# Patient Record
Sex: Female | Born: 1965 | Race: Black or African American | Hispanic: No | Smoking: Current every day smoker
Health system: Southern US, Community
[De-identification: ages and names within clinical notes are randomized; demographics above are authoritative.]

## PROBLEM LIST (undated history)

## (undated) DIAGNOSIS — F419 Anxiety disorder, unspecified: Secondary | ICD-10-CM

## (undated) DIAGNOSIS — I10 Essential (primary) hypertension: Secondary | ICD-10-CM

## (undated) DIAGNOSIS — J449 Chronic obstructive pulmonary disease, unspecified: Secondary | ICD-10-CM

## (undated) DIAGNOSIS — R51 Headache: Secondary | ICD-10-CM

## (undated) DIAGNOSIS — F14988 Cocaine use, unspecified with other cocaine-induced disorder: Secondary | ICD-10-CM

## (undated) DIAGNOSIS — F250 Schizoaffective disorder, bipolar type: Secondary | ICD-10-CM

## (undated) DIAGNOSIS — F4310 Post-traumatic stress disorder, unspecified: Secondary | ICD-10-CM

## (undated) DIAGNOSIS — G8929 Other chronic pain: Secondary | ICD-10-CM

## (undated) DIAGNOSIS — R569 Unspecified convulsions: Secondary | ICD-10-CM

## (undated) DIAGNOSIS — M549 Dorsalgia, unspecified: Secondary | ICD-10-CM

## (undated) DIAGNOSIS — G629 Polyneuropathy, unspecified: Secondary | ICD-10-CM

## (undated) DIAGNOSIS — J45909 Unspecified asthma, uncomplicated: Secondary | ICD-10-CM

## (undated) HISTORY — PX: ABDOMINAL HYSTERECTOMY: SHX81

---

## 1999-12-22 ENCOUNTER — Emergency Department (HOSPITAL_COMMUNITY): Admission: EM | Admit: 1999-12-22 | Discharge: 1999-12-22 | Payer: Self-pay | Admitting: Emergency Medicine

## 2000-01-08 ENCOUNTER — Emergency Department (HOSPITAL_COMMUNITY): Admission: EM | Admit: 2000-01-08 | Discharge: 2000-01-08 | Payer: Self-pay | Admitting: Emergency Medicine

## 2000-03-06 ENCOUNTER — Emergency Department (HOSPITAL_COMMUNITY): Admission: EM | Admit: 2000-03-06 | Discharge: 2000-03-06 | Payer: Self-pay | Admitting: Emergency Medicine

## 2000-04-23 ENCOUNTER — Emergency Department (HOSPITAL_COMMUNITY): Admission: EM | Admit: 2000-04-23 | Discharge: 2000-04-23 | Payer: Self-pay

## 2000-05-05 ENCOUNTER — Emergency Department (HOSPITAL_COMMUNITY): Admission: EM | Admit: 2000-05-05 | Discharge: 2000-05-05 | Payer: Self-pay | Admitting: Emergency Medicine

## 2000-05-07 ENCOUNTER — Emergency Department (HOSPITAL_COMMUNITY): Admission: EM | Admit: 2000-05-07 | Discharge: 2000-05-29 | Payer: Self-pay | Admitting: Emergency Medicine

## 2000-08-23 ENCOUNTER — Emergency Department (HOSPITAL_COMMUNITY): Admission: EM | Admit: 2000-08-23 | Discharge: 2000-08-23 | Payer: Self-pay | Admitting: *Deleted

## 2000-08-29 ENCOUNTER — Emergency Department (HOSPITAL_COMMUNITY): Admission: EM | Admit: 2000-08-29 | Discharge: 2000-08-30 | Payer: Self-pay | Admitting: Emergency Medicine

## 2000-10-03 ENCOUNTER — Emergency Department (HOSPITAL_COMMUNITY): Admission: EM | Admit: 2000-10-03 | Discharge: 2000-10-03 | Payer: Self-pay | Admitting: Emergency Medicine

## 2000-10-03 ENCOUNTER — Encounter: Payer: Self-pay | Admitting: Emergency Medicine

## 2001-10-31 ENCOUNTER — Emergency Department (HOSPITAL_COMMUNITY): Admission: EM | Admit: 2001-10-31 | Discharge: 2001-10-31 | Payer: Self-pay

## 2001-12-02 ENCOUNTER — Emergency Department (HOSPITAL_COMMUNITY): Admission: EM | Admit: 2001-12-02 | Discharge: 2001-12-02 | Payer: Self-pay | Admitting: Emergency Medicine

## 2005-03-01 ENCOUNTER — Emergency Department (HOSPITAL_COMMUNITY): Admission: EM | Admit: 2005-03-01 | Discharge: 2005-03-01 | Payer: Self-pay | Admitting: Emergency Medicine

## 2005-07-01 ENCOUNTER — Emergency Department (HOSPITAL_COMMUNITY): Admission: EM | Admit: 2005-07-01 | Discharge: 2005-07-01 | Payer: Self-pay | Admitting: *Deleted

## 2005-12-07 ENCOUNTER — Emergency Department (HOSPITAL_COMMUNITY): Admission: EM | Admit: 2005-12-07 | Discharge: 2005-12-07 | Payer: Self-pay | Admitting: Emergency Medicine

## 2005-12-10 ENCOUNTER — Emergency Department (HOSPITAL_COMMUNITY): Admission: EM | Admit: 2005-12-10 | Discharge: 2005-12-10 | Payer: Self-pay | Admitting: Emergency Medicine

## 2006-02-07 ENCOUNTER — Emergency Department (HOSPITAL_COMMUNITY): Admission: EM | Admit: 2006-02-07 | Discharge: 2006-02-07 | Payer: Self-pay | Admitting: Emergency Medicine

## 2006-02-08 ENCOUNTER — Emergency Department (HOSPITAL_COMMUNITY): Admission: EM | Admit: 2006-02-08 | Discharge: 2006-02-08 | Payer: Self-pay | Admitting: Emergency Medicine

## 2007-01-05 ENCOUNTER — Emergency Department (HOSPITAL_COMMUNITY): Admission: EM | Admit: 2007-01-05 | Discharge: 2007-01-05 | Payer: Self-pay | Admitting: *Deleted

## 2007-01-16 ENCOUNTER — Emergency Department (HOSPITAL_COMMUNITY): Admission: EM | Admit: 2007-01-16 | Discharge: 2007-01-16 | Payer: Self-pay | Admitting: Emergency Medicine

## 2007-02-11 ENCOUNTER — Emergency Department (HOSPITAL_COMMUNITY): Admission: EM | Admit: 2007-02-11 | Discharge: 2007-02-11 | Payer: Self-pay | Admitting: Emergency Medicine

## 2007-04-12 ENCOUNTER — Emergency Department (HOSPITAL_COMMUNITY): Admission: EM | Admit: 2007-04-12 | Discharge: 2007-04-12 | Payer: Self-pay | Admitting: Emergency Medicine

## 2007-07-14 ENCOUNTER — Emergency Department (HOSPITAL_COMMUNITY): Admission: EM | Admit: 2007-07-14 | Discharge: 2007-07-14 | Payer: Self-pay | Admitting: Emergency Medicine

## 2007-08-03 ENCOUNTER — Emergency Department (HOSPITAL_COMMUNITY): Admission: EM | Admit: 2007-08-03 | Discharge: 2007-08-03 | Payer: Self-pay | Admitting: Emergency Medicine

## 2008-02-09 ENCOUNTER — Emergency Department (HOSPITAL_COMMUNITY): Admission: EM | Admit: 2008-02-09 | Discharge: 2008-02-09 | Payer: Self-pay | Admitting: Emergency Medicine

## 2008-05-24 ENCOUNTER — Emergency Department (HOSPITAL_COMMUNITY): Admission: EM | Admit: 2008-05-24 | Discharge: 2008-05-24 | Payer: Self-pay | Admitting: Emergency Medicine

## 2008-08-14 ENCOUNTER — Emergency Department (HOSPITAL_COMMUNITY): Admission: EM | Admit: 2008-08-14 | Discharge: 2008-08-14 | Payer: Self-pay | Admitting: Emergency Medicine

## 2008-08-20 ENCOUNTER — Emergency Department (HOSPITAL_COMMUNITY): Admission: EM | Admit: 2008-08-20 | Discharge: 2008-08-21 | Payer: Self-pay | Admitting: Emergency Medicine

## 2008-08-21 ENCOUNTER — Inpatient Hospital Stay (HOSPITAL_COMMUNITY): Admission: AD | Admit: 2008-08-21 | Discharge: 2008-08-22 | Payer: Self-pay | Admitting: Psychiatry

## 2008-08-21 ENCOUNTER — Ambulatory Visit: Payer: Self-pay | Admitting: Psychiatry

## 2008-08-27 ENCOUNTER — Emergency Department (HOSPITAL_COMMUNITY): Admission: EM | Admit: 2008-08-27 | Discharge: 2008-08-27 | Payer: Self-pay | Admitting: Emergency Medicine

## 2008-10-22 ENCOUNTER — Emergency Department (HOSPITAL_COMMUNITY): Admission: EM | Admit: 2008-10-22 | Discharge: 2008-10-22 | Payer: Self-pay | Admitting: Emergency Medicine

## 2008-10-23 ENCOUNTER — Emergency Department (HOSPITAL_COMMUNITY): Admission: EM | Admit: 2008-10-23 | Discharge: 2008-10-23 | Payer: Self-pay | Admitting: Emergency Medicine

## 2008-10-24 ENCOUNTER — Emergency Department (HOSPITAL_COMMUNITY): Admission: EM | Admit: 2008-10-24 | Discharge: 2008-10-24 | Payer: Self-pay | Admitting: Emergency Medicine

## 2008-10-28 ENCOUNTER — Emergency Department (HOSPITAL_COMMUNITY): Admission: EM | Admit: 2008-10-28 | Discharge: 2008-10-28 | Payer: Self-pay | Admitting: Emergency Medicine

## 2008-10-29 ENCOUNTER — Emergency Department (HOSPITAL_COMMUNITY): Admission: EM | Admit: 2008-10-29 | Discharge: 2008-10-29 | Payer: Self-pay | Admitting: Emergency Medicine

## 2008-11-08 ENCOUNTER — Emergency Department (HOSPITAL_COMMUNITY): Admission: EM | Admit: 2008-11-08 | Discharge: 2008-11-08 | Payer: Self-pay | Admitting: Emergency Medicine

## 2008-11-29 ENCOUNTER — Emergency Department (HOSPITAL_COMMUNITY): Admission: EM | Admit: 2008-11-29 | Discharge: 2008-11-29 | Payer: Self-pay | Admitting: Emergency Medicine

## 2008-12-12 ENCOUNTER — Emergency Department (HOSPITAL_COMMUNITY): Admission: EM | Admit: 2008-12-12 | Discharge: 2008-12-12 | Payer: Self-pay | Admitting: Emergency Medicine

## 2008-12-13 ENCOUNTER — Emergency Department (HOSPITAL_COMMUNITY): Admission: EM | Admit: 2008-12-13 | Discharge: 2008-12-13 | Payer: Self-pay | Admitting: Emergency Medicine

## 2009-01-12 ENCOUNTER — Emergency Department (HOSPITAL_COMMUNITY): Admission: EM | Admit: 2009-01-12 | Discharge: 2009-01-12 | Payer: Self-pay | Admitting: Emergency Medicine

## 2009-02-04 ENCOUNTER — Emergency Department (HOSPITAL_COMMUNITY): Admission: EM | Admit: 2009-02-04 | Discharge: 2009-02-04 | Payer: Self-pay | Admitting: Emergency Medicine

## 2009-06-20 ENCOUNTER — Emergency Department (HOSPITAL_COMMUNITY): Admission: EM | Admit: 2009-06-20 | Discharge: 2009-06-20 | Payer: Self-pay | Admitting: Emergency Medicine

## 2009-07-23 ENCOUNTER — Emergency Department (HOSPITAL_COMMUNITY): Admission: EM | Admit: 2009-07-23 | Discharge: 2009-07-23 | Payer: Self-pay | Admitting: Emergency Medicine

## 2010-01-20 ENCOUNTER — Emergency Department (HOSPITAL_COMMUNITY): Admission: EM | Admit: 2010-01-20 | Discharge: 2010-01-20 | Payer: Self-pay | Admitting: Emergency Medicine

## 2010-02-23 ENCOUNTER — Emergency Department (HOSPITAL_COMMUNITY): Admission: EM | Admit: 2010-02-23 | Discharge: 2010-02-23 | Payer: Self-pay | Admitting: Emergency Medicine

## 2010-04-17 IMAGING — CT CT HEAD W/O CM
4 of 6 series · 18 of 37 positions shown, 20 images · non-contrast
Comparison: CT 10/24/2008

CT HEAD

CLINICAL DATA: Fell today in bathtub.  Headache.  Dizziness.

CT HEAD WITHOUT CONTRAST
CT CERVICAL SPINE WITHOUT CONTRAST
TECHNIQUE: Multidetector CT imaging of the head and cervical spine
was performed following the standard protocol without intravenous
contrast.  Multiplanar CT image reconstructions of the cervical
spine were also generated.

[Series 2: brain · axial · 0.47mm/px · z∈[-78,-32]mm · 2 of 28 slices shown]
[im 10/28  brain]
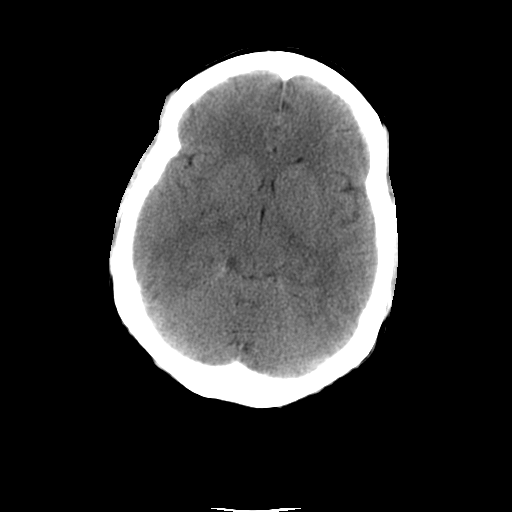
[im 19/28  brain]
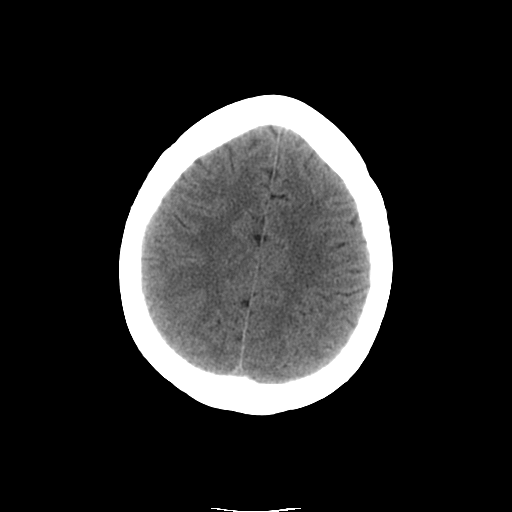

[Series 3: recon 2: brain · axial · 0.47mm/px · z∈[-107,-5]mm · 6 of 56 slices shown, 8 images]
[im 8/56  brain]
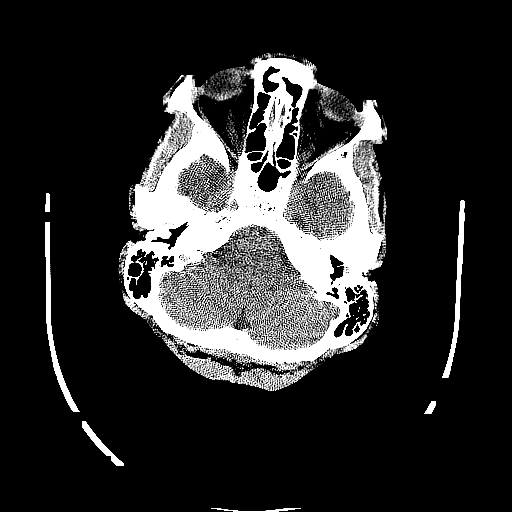
[im 8/56  bone]
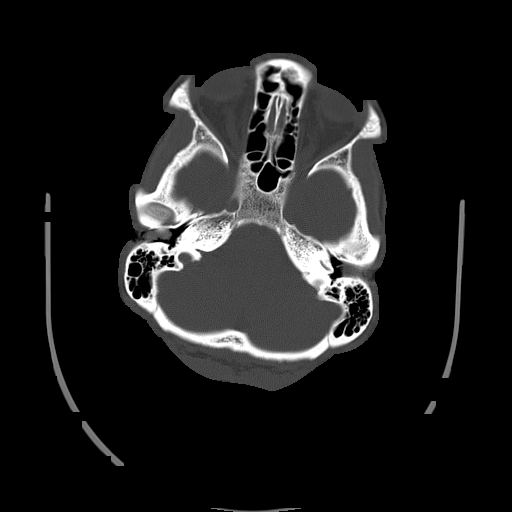
[im 16/56  brain]
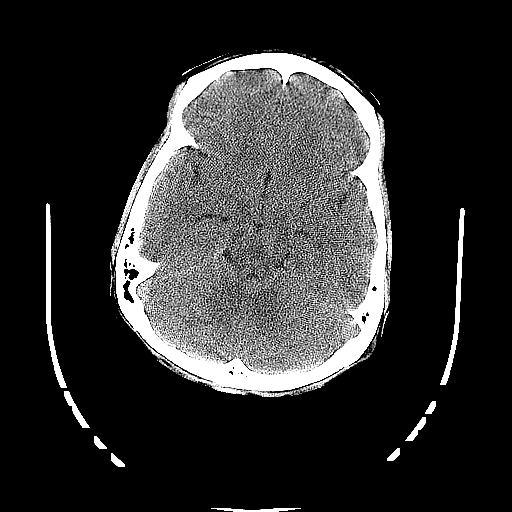
[im 24/56  brain]
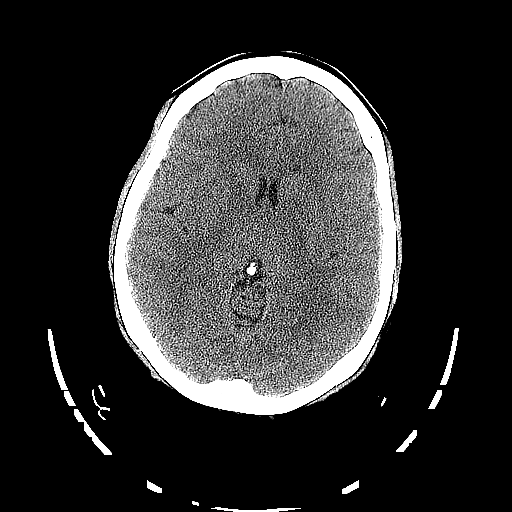
[im 32/56  brain]
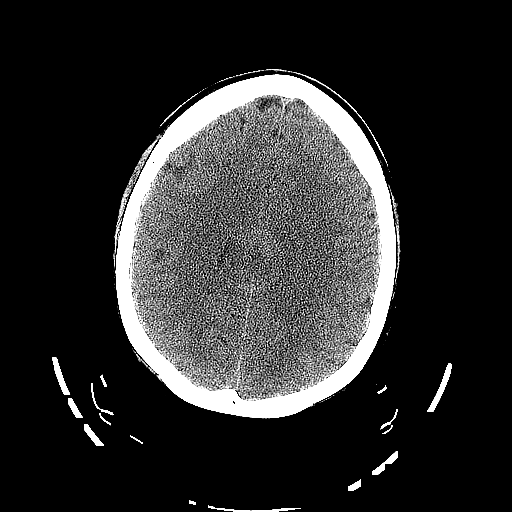
[im 40/56  brain]
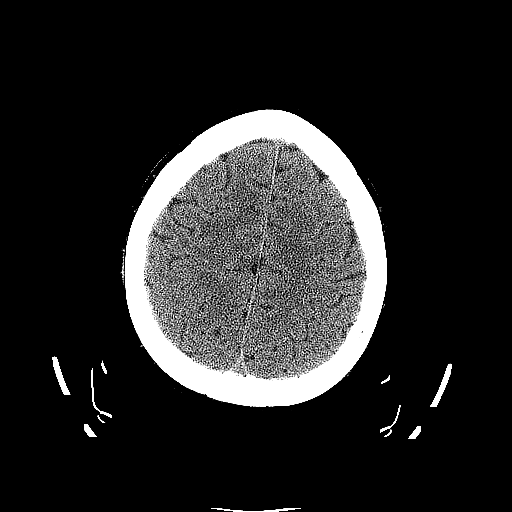
[im 40/56  bone]
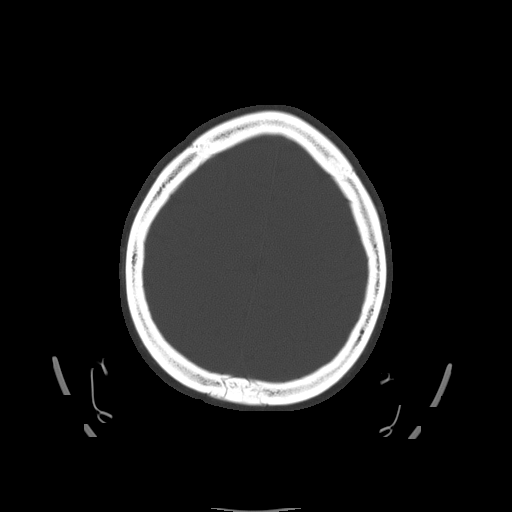
[im 48/56  brain]
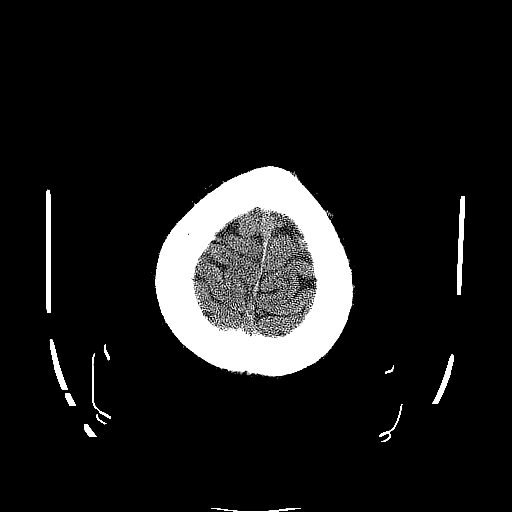

[Series 4: cervical spine · axial · 0.27mm/px · z∈[-272,-154]mm · 7 of 63 slices shown]
[im 8/63  brain]
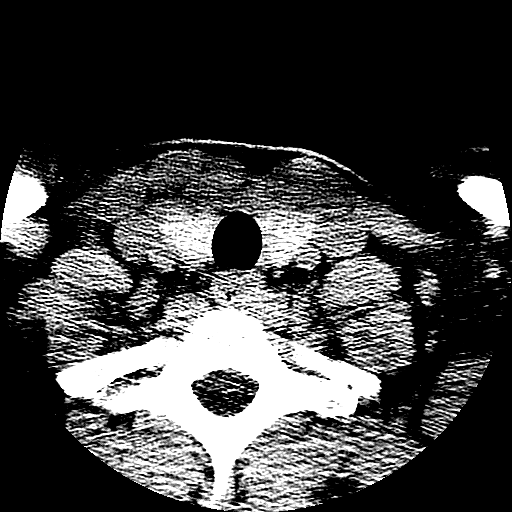
[im 16/63  brain]
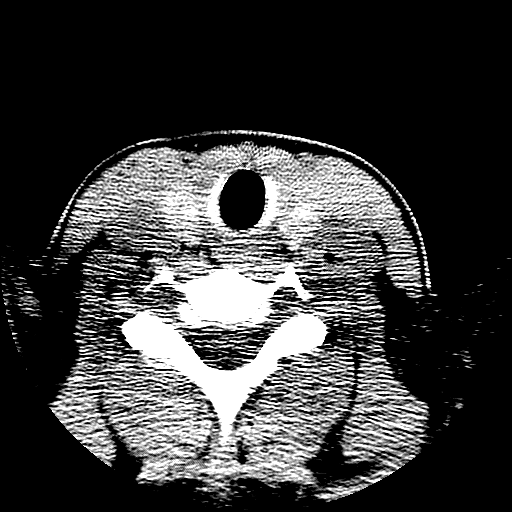
[im 24/63  brain]
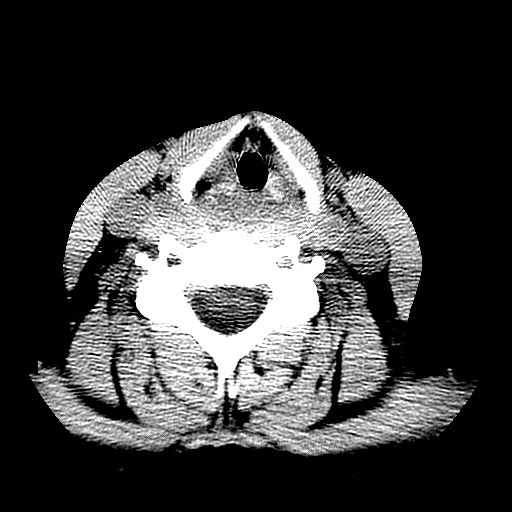
[im 32/63  brain]
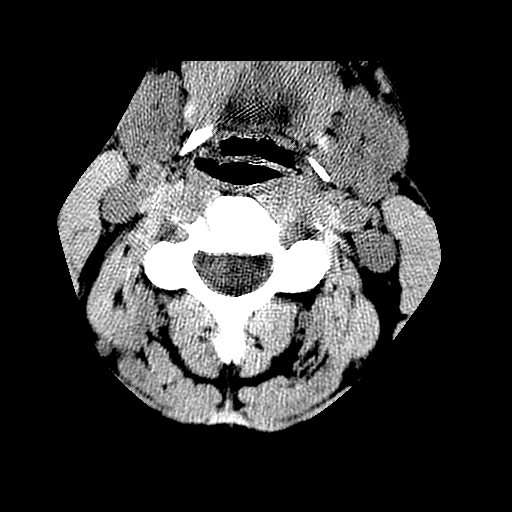
[im 39/63  brain]
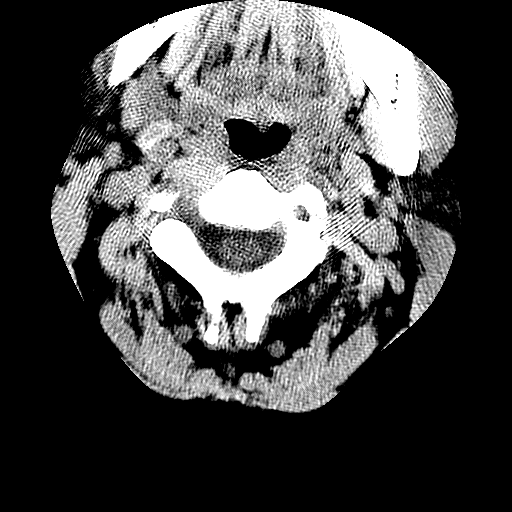
[im 47/63  brain]
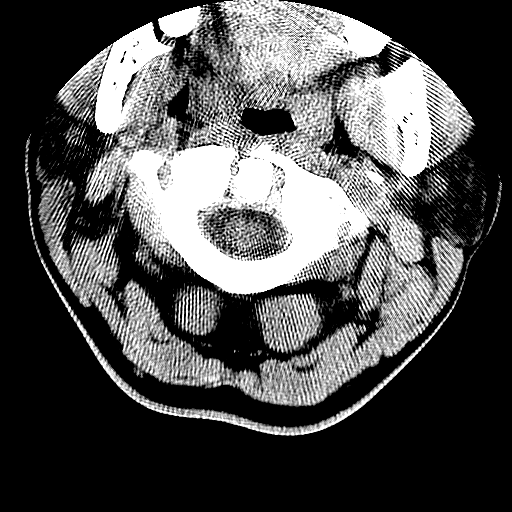
[im 55/63  brain]
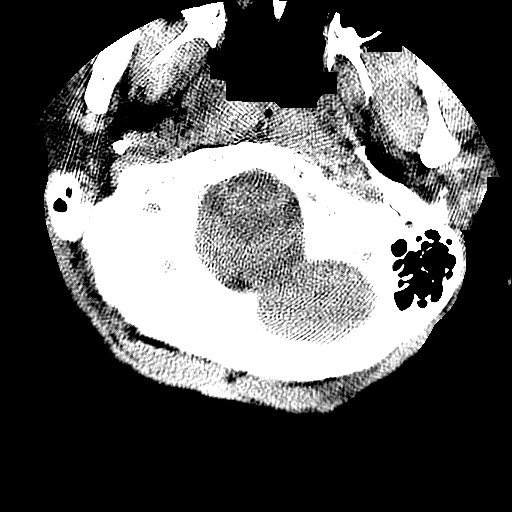

[Series 600: cor · coronal · 0.31mm/px · 3 of 35 slices shown]
[im 9/35  brain]
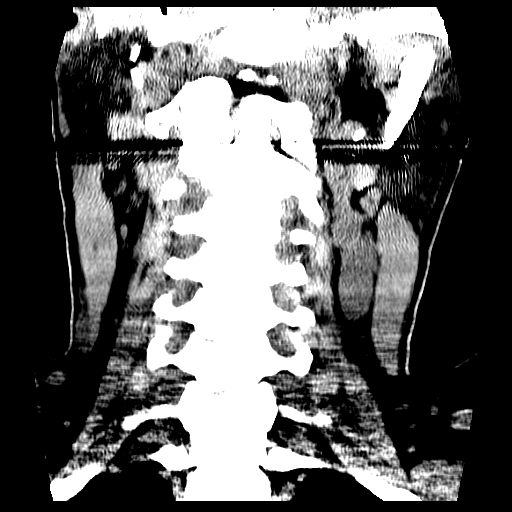
[im 13/35  brain]
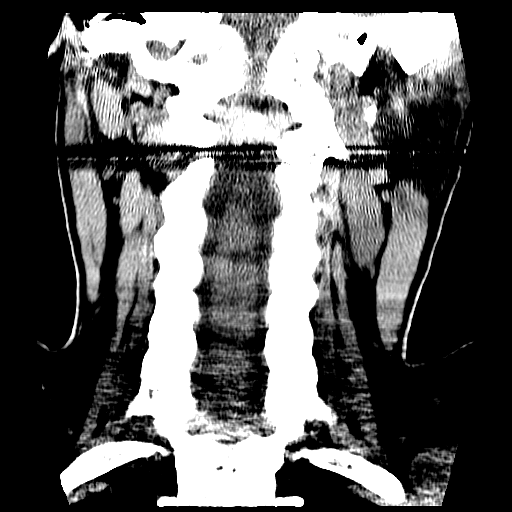
[im 17/35  brain]
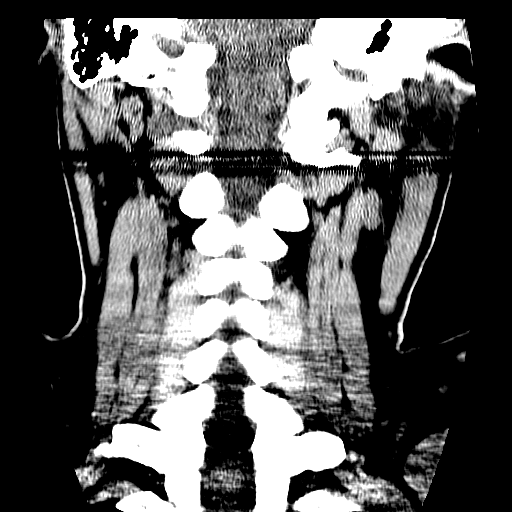

[18 of 37 positions shown; findings below may reference images not displayed]

FINDINGS: Bony calvarium intact.  No intracranial hemorrhage, mass
lesion, or acute infarction.
IMPRESSION: No acute intracranial findings.

CT CERVICAL SPINE
FINDINGS: No fracture or subluxation.
IMPRESSION: Negative CT cervical spine.

## 2010-07-17 LAB — CBC
HCT: 35.1 % — ABNORMAL LOW (ref 36.0–46.0)
Hemoglobin: 12.3 g/dL (ref 12.0–15.0)
MCHC: 35 g/dL (ref 30.0–36.0)
MCV: 73 fL — ABNORMAL LOW (ref 78.0–100.0)
WBC: 12.3 10*3/uL — ABNORMAL HIGH (ref 4.0–10.5)

## 2010-07-17 LAB — BASIC METABOLIC PANEL
Chloride: 106 mEq/L (ref 96–112)
GFR calc non Af Amer: >60 mL/min (ref >60)
Glucose, Bld: 95 mg/dL (ref 70–99)
Potassium: 3.6 mEq/L (ref 3.5–5.1)
Sodium: 140 mEq/L (ref 135–145)

## 2010-07-17 LAB — RAPID URINE DRUG SCREEN, HOSP PERFORMED
Benzodiazepines: NOT DETECTED
Cocaine: POSITIVE — AB
Tetrahydrocannabinol: NOT DETECTED

## 2010-07-17 LAB — DIFFERENTIAL
Lymphocytes Relative: 23 % (ref 12–46)
Monocytes Absolute: 0.7 10*3/uL (ref 0.1–1.0)
Monocytes Relative: 6 % (ref 3–12)
Neutro Abs: 8.7 10*3/uL — ABNORMAL HIGH (ref 1.7–7.7)

## 2010-07-17 LAB — POCT CARDIAC MARKERS

## 2010-07-24 LAB — GLUCOSE, CAPILLARY: Glucose-Capillary: 96 mg/dL (ref 70–99)

## 2010-07-29 LAB — URINALYSIS, ROUTINE W REFLEX MICROSCOPIC
Glucose, UA: NEGATIVE mg/dL
Protein, ur: NEGATIVE mg/dL
pH: 5.5 (ref 5.0–8.0)

## 2010-07-29 LAB — POCT I-STAT, CHEM 8
BUN: 15 mg/dL (ref 6–23)
Chloride: 110 mEq/L (ref 96–112)
Potassium: 4.1 mEq/L (ref 3.5–5.1)
Sodium: 140 mEq/L (ref 135–145)

## 2010-07-29 LAB — URINE MICROSCOPIC-ADD ON

## 2010-08-12 LAB — TYPE AND SCREEN
ABO/RH(D): AB POS
Antibody Screen: NEGATIVE

## 2010-08-12 LAB — DIFFERENTIAL
Basophils Absolute: 0 10*3/uL (ref 0.0–0.1)
Eosinophils Relative: 1 % (ref 0–5)
Lymphocytes Relative: 19 % (ref 12–46)
Lymphs Abs: 1.2 10*3/uL (ref 0.7–4.0)
Lymphs Abs: 1.8 10*3/uL (ref 0.7–4.0)
Monocytes Absolute: 0.8 10*3/uL (ref 0.1–1.0)
Monocytes Relative: 5 % (ref 3–12)
Monocytes Relative: 8 % (ref 3–12)
Neutro Abs: 5.3 10*3/uL (ref 1.7–7.7)
Neutrophils Relative %: 76 % (ref 43–77)

## 2010-08-12 LAB — POCT I-STAT, CHEM 8
BUN: 10 mg/dL (ref 6–23)
Calcium, Ion: 1.11 mmol/L — ABNORMAL LOW (ref 1.12–1.32)
HCT: 34 % — ABNORMAL LOW (ref 36.0–46.0)
HCT: 36 % (ref 36.0–46.0)
Hemoglobin: 11.6 g/dL — ABNORMAL LOW (ref 12.0–15.0)
Hemoglobin: 12.2 g/dL (ref 12.0–15.0)
Sodium: 138 mEq/L (ref 135–145)
Sodium: 139 mEq/L (ref 135–145)
TCO2: 22 mmol/L (ref 0–100)
TCO2: 24 mmol/L (ref 0–100)

## 2010-08-12 LAB — POCT CARDIAC MARKERS
Myoglobin, poc: 39.1 ng/mL (ref 12–200)
Troponin i, poc: <0.05 ng/mL (ref 0.00–0.09)

## 2010-08-12 LAB — CBC
HCT: 34.9 % — ABNORMAL LOW (ref 36.0–46.0)
Hemoglobin: 11.6 g/dL — ABNORMAL LOW (ref 12.0–15.0)
RBC: 4.66 MIL/uL (ref 3.87–5.11)
RDW: 14.8 % (ref 11.5–15.5)
WBC: 7 10*3/uL (ref 4.0–10.5)

## 2010-08-12 LAB — GLUCOSE, CAPILLARY

## 2010-08-12 LAB — ABO/RH: ABO/RH(D): AB POS

## 2010-08-13 ENCOUNTER — Emergency Department (HOSPITAL_COMMUNITY)
Admission: EM | Admit: 2010-08-13 | Discharge: 2010-08-13 | Disposition: A | Discharge status: Home / Self Care | Payer: Self-pay | Attending: Emergency Medicine | Admitting: Emergency Medicine

## 2010-08-13 DIAGNOSIS — Z0389 Encounter for observation for other suspected diseases and conditions ruled out: Secondary | ICD-10-CM | POA: Insufficient documentation

## 2010-08-14 LAB — RAPID URINE DRUG SCREEN, HOSP PERFORMED
Amphetamines: NOT DETECTED
Cocaine: POSITIVE — AB
Opiates: NOT DETECTED
Opiates: NOT DETECTED
Tetrahydrocannabinol: NOT DETECTED
Tetrahydrocannabinol: NOT DETECTED

## 2010-08-14 LAB — GLUCOSE, CAPILLARY
Glucose-Capillary: 108 mg/dL — ABNORMAL HIGH (ref 70–99)
Glucose-Capillary: 94 mg/dL (ref 70–99)

## 2010-08-14 LAB — HEPATIC FUNCTION PANEL
ALT: 22 U/L (ref 0–35)
Alkaline Phosphatase: 50 U/L (ref 39–117)
Bilirubin, Direct: <0.1 mg/dL (ref 0.0–0.3)
Total Bilirubin: 0.5 mg/dL (ref 0.3–1.2)

## 2010-08-14 LAB — CBC
HCT: 36.1 % (ref 36.0–46.0)
Hemoglobin: 12.4 g/dL (ref 12.0–15.0)
MCHC: 33.9 g/dL (ref 30.0–36.0)
MCV: 76.2 fL — ABNORMAL LOW (ref 78.0–100.0)
Platelets: 209 10*3/uL (ref 150–400)
RDW: 15 % (ref 11.5–15.5)
WBC: 7.2 10*3/uL (ref 4.0–10.5)

## 2010-08-14 LAB — DIFFERENTIAL
Basophils Absolute: 0 10*3/uL (ref 0.0–0.1)
Basophils Relative: 0 % (ref 0–1)
Eosinophils Absolute: 0.1 10*3/uL (ref 0.0–0.7)
Eosinophils Relative: 1 % (ref 0–5)
Eosinophils Relative: 0 % (ref 0–5)
Lymphocytes Relative: 23 % (ref 12–46)
Lymphocytes Relative: 19 % (ref 12–46)
Lymphs Abs: 1.6 10*3/uL (ref 0.7–4.0)
Monocytes Absolute: 0.6 10*3/uL (ref 0.1–1.0)
Neutro Abs: 4.9 10*3/uL (ref 1.7–7.7)

## 2010-08-14 LAB — URINALYSIS, ROUTINE W REFLEX MICROSCOPIC
Bilirubin Urine: NEGATIVE
Bilirubin Urine: NEGATIVE
Ketones, ur: NEGATIVE mg/dL
Nitrite: NEGATIVE
Nitrite: NEGATIVE
Specific Gravity, Urine: 1.022 (ref 1.005–1.030)
Specific Gravity, Urine: 1.005 (ref 1.005–1.030)
Urobilinogen, UA: 1 mg/dL (ref 0.0–1.0)
Urobilinogen, UA: 0.2 mg/dL (ref 0.0–1.0)
pH: 5.5 (ref 5.0–8.0)
pH: 6 (ref 5.0–8.0)

## 2010-08-14 LAB — BASIC METABOLIC PANEL
BUN: 12 mg/dL (ref 6–23)
BUN: 18 mg/dL (ref 6–23)
CO2: 23 mEq/L (ref 19–32)
Calcium: 9.1 mg/dL (ref 8.4–10.5)
Chloride: 110 mEq/L (ref 96–112)
GFR calc non Af Amer: >60 mL/min (ref >60)
GFR calc non Af Amer: >60 mL/min (ref >60)
Glucose, Bld: 101 mg/dL — ABNORMAL HIGH (ref 70–99)
Glucose, Bld: 88 mg/dL (ref 70–99)
Potassium: 3.9 mEq/L (ref 3.5–5.1)
Sodium: 142 mEq/L (ref 135–145)

## 2010-08-14 LAB — PREGNANCY, URINE: Preg Test, Ur: NEGATIVE

## 2010-08-14 LAB — ACETAMINOPHEN LEVEL: Acetaminophen (Tylenol), Serum: <10 ug/mL — ABNORMAL LOW (ref 10–30)

## 2010-08-19 LAB — POCT I-STAT, CHEM 8
Chloride: 106 mEq/L (ref 96–112)
Glucose, Bld: 92 mg/dL (ref 70–99)
HCT: 37 % (ref 36.0–46.0)
Potassium: 4 mEq/L (ref 3.5–5.1)
Sodium: 140 mEq/L (ref 135–145)

## 2010-08-19 LAB — URINALYSIS, ROUTINE W REFLEX MICROSCOPIC
Bilirubin Urine: NEGATIVE
Nitrite: NEGATIVE
Specific Gravity, Urine: 1.021 (ref 1.005–1.030)
Urobilinogen, UA: 1 mg/dL (ref 0.0–1.0)
pH: 7 (ref 5.0–8.0)

## 2010-08-19 LAB — URINE MICROSCOPIC-ADD ON

## 2010-08-19 LAB — URINE CULTURE: Culture: NO GROWTH

## 2010-09-17 NOTE — Discharge Summary (Signed)
Kathleen [name], FLEGAL NO.:  [id number]   MEDICAL RECORD NO.:  [id number]          PATIENT TYPE:  IPS   LOCATION:  0301                          FACILITY:  [facility]   PHYSICIAN:  Jasmine Pang, M.D. DATE OF BIRTH:  02/27/1966   DATE OF ADMISSION:  08/21/2008  DATE OF DISCHARGE:  08/22/2008                               DISCHARGE SUMMARY   IDENTIFYING INFORMATION:  This is a 45 year old African American female  who was admitted on a voluntary basis on August 21, 2008.   HISTORY OF PRESENT ILLNESS:  The patient presented in the ED complaining  of suicidal ideation I just do not feel safe, she had no plan.  She  had relapsed on cocaine.  For further admission information see  psychiatric admission assessment.   PHYSICAL FINDINGS:  There were no acute physical or medical problems  noted.  Her complete physical exam was done in the ED prior to transfer  to [place].   LABORATORY FINDINGS:  Liver panel was normal.  Basic metabolic panel was  normal.  UDS revealed cocaine and benzodiazepines.  (See she is supposed  to be on Valium).   HOSPITAL COURSE:  Upon admission, the patient was restarted on her home  medications of Glucophage 500 mg b.i.d., Prozac 30 mg daily, Neurontin  300 mg p.o. t.i.d..  Seroquel 25 mg p.o. b.i.d., and Ativan 1 mg p.o.  b.i.d. She was also started on Ativan 1 mg p.o. q.8 h. p.r.n. anxiety.  Due to extreme anxiety the Ativan dose was increased to 1 mg p.o. q.4 h.  p.r.n. anxiety.  She was also started on Seroquel 50 mg now for severe  anxiety, then every 4 hours as needed for agitation, or anxiety.  In  individual sessions, the patient was casually dressed.  She had good eye  contact.  Motor behavior was normal.  Speech was somewhat pressured, but  the mood was anxious, and euphoric.  Affect was consistent with mood.  There were no psychotic symptoms noted.  There were no thought disorder  noted.  The patient states she went to church and felt very  emotional.  She goes to [employer].  She is currently filing for disability.  She is on probation for the next 2 months because of drug charges 2  years ago.  She did relapse on cocaine prior to admission.  On August 22, 2008, sleep was good, appetite was good.  Mood euthymic.  Affect  consistent with mood.  There was no suicidal or homicidal ideation.  No  thoughts of self-injurious behavior.  No old auditory or visual  hallucinations.  No paranoia or delusions.  Thoughts were logical and  goal-directed.  Thought content, no predominant theme.  Cognitive was  grossly intact.  Insight good, fair.  Judgment fair.  Impulse control  fair.  It was felt the patient was safe for discharge today.   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  Axis II: None.  Axis III: Diabetes mellitus type 2.  Axis IV: Moderate (problems with primary support group,  burden of  psychiatric and chemical abuse illness, and other psychosocial  problems).  Also, burden of diabetes.   DISCHARGE/PLAN:  There were no specific activity level or dietary  restrictions.   POSTHOSPITAL CARE PLANS:  The patient will go to the [facility] for  follow up treatment on August 29, 2008 at 3 o'clock p.m.  She will also  go to [facility] for counseling.   DISCHARGE MEDICATIONS:  The patient is to resume her Ativan as  prescribed Seroquel 50 mg 3 times a day as needed for agitation and  anxiety, and she is to resume the metformin and Neurontin as prescribed  as well as the Prozac.      Jasmine Pang, M.D.  Electronically Signed     BHS/MEDQ  D:  08/22/2008  T:  08/23/2008  Job:  [id number]

## 2010-10-04 ENCOUNTER — Emergency Department (HOSPITAL_COMMUNITY)
Admission: EM | Admit: 2010-10-04 | Discharge: 2010-10-04 | Disposition: A | Discharge status: Home / Self Care | Payer: Self-pay | Attending: Emergency Medicine | Admitting: Emergency Medicine

## 2010-10-04 DIAGNOSIS — F3130 Bipolar disorder, current episode depressed, mild or moderate severity, unspecified: Secondary | ICD-10-CM | POA: Insufficient documentation

## 2010-10-04 DIAGNOSIS — Z760 Encounter for issue of repeat prescription: Secondary | ICD-10-CM | POA: Insufficient documentation

## 2010-10-04 DIAGNOSIS — G40909 Epilepsy, unspecified, not intractable, without status epilepticus: Secondary | ICD-10-CM | POA: Insufficient documentation

## 2010-10-04 DIAGNOSIS — I10 Essential (primary) hypertension: Secondary | ICD-10-CM | POA: Insufficient documentation

## 2010-10-04 DIAGNOSIS — F411 Generalized anxiety disorder: Secondary | ICD-10-CM | POA: Insufficient documentation

## 2010-10-04 DIAGNOSIS — E119 Type 2 diabetes mellitus without complications: Secondary | ICD-10-CM | POA: Insufficient documentation

## 2010-10-11 ENCOUNTER — Emergency Department (HOSPITAL_COMMUNITY)
Admission: EM | Admit: 2010-10-11 | Discharge: 2010-10-11 | Discharge status: Against Medical Advice | Payer: Self-pay | Attending: Emergency Medicine | Admitting: Emergency Medicine

## 2010-10-11 DIAGNOSIS — F319 Bipolar disorder, unspecified: Secondary | ICD-10-CM | POA: Insufficient documentation

## 2010-10-11 DIAGNOSIS — R569 Unspecified convulsions: Secondary | ICD-10-CM | POA: Insufficient documentation

## 2010-10-11 DIAGNOSIS — E119 Type 2 diabetes mellitus without complications: Secondary | ICD-10-CM | POA: Insufficient documentation

## 2010-10-11 DIAGNOSIS — I10 Essential (primary) hypertension: Secondary | ICD-10-CM | POA: Insufficient documentation

## 2010-10-11 DIAGNOSIS — N920 Excessive and frequent menstruation with regular cycle: Secondary | ICD-10-CM | POA: Insufficient documentation

## 2010-10-11 LAB — URINALYSIS, ROUTINE W REFLEX MICROSCOPIC
Nitrite: NEGATIVE
Specific Gravity, Urine: 1.018 (ref 1.005–1.030)
Urobilinogen, UA: 0.2 mg/dL (ref 0.0–1.0)
pH: 5 (ref 5.0–8.0)

## 2010-10-11 LAB — POCT I-STAT, CHEM 8
Chloride: 105 mEq/L (ref 96–112)
Glucose, Bld: 88 mg/dL (ref 70–99)
HCT: 37 % (ref 36.0–46.0)
Hemoglobin: 12.6 g/dL (ref 12.0–15.0)
Potassium: 4.1 mEq/L (ref 3.5–5.1)
Sodium: 140 mEq/L (ref 135–145)

## 2010-10-11 LAB — URINE MICROSCOPIC-ADD ON

## 2010-10-11 LAB — POCT PREGNANCY, URINE: Preg Test, Ur: NEGATIVE

## 2010-10-19 ENCOUNTER — Emergency Department (HOSPITAL_COMMUNITY): Payer: Self-pay

## 2010-10-19 ENCOUNTER — Emergency Department (HOSPITAL_COMMUNITY)
Admission: EM | Admit: 2010-10-19 | Discharge: 2010-10-20 | Disposition: A | Discharge status: Home / Self Care | Payer: Self-pay | Attending: Emergency Medicine | Admitting: Emergency Medicine

## 2010-10-19 DIAGNOSIS — I10 Essential (primary) hypertension: Secondary | ICD-10-CM | POA: Insufficient documentation

## 2010-10-19 DIAGNOSIS — Z79899 Other long term (current) drug therapy: Secondary | ICD-10-CM | POA: Insufficient documentation

## 2010-10-19 DIAGNOSIS — M25579 Pain in unspecified ankle and joints of unspecified foot: Secondary | ICD-10-CM | POA: Insufficient documentation

## 2010-10-19 DIAGNOSIS — M79609 Pain in unspecified limb: Secondary | ICD-10-CM | POA: Insufficient documentation

## 2010-10-19 DIAGNOSIS — M7989 Other specified soft tissue disorders: Secondary | ICD-10-CM | POA: Insufficient documentation

## 2010-10-19 DIAGNOSIS — G40909 Epilepsy, unspecified, not intractable, without status epilepticus: Secondary | ICD-10-CM | POA: Insufficient documentation

## 2010-10-19 DIAGNOSIS — F319 Bipolar disorder, unspecified: Secondary | ICD-10-CM | POA: Insufficient documentation

## 2010-10-19 DIAGNOSIS — W208XXA Other cause of strike by thrown, projected or falling object, initial encounter: Secondary | ICD-10-CM | POA: Insufficient documentation

## 2010-10-19 DIAGNOSIS — S8010XA Contusion of unspecified lower leg, initial encounter: Secondary | ICD-10-CM | POA: Insufficient documentation

## 2010-10-19 DIAGNOSIS — F341 Dysthymic disorder: Secondary | ICD-10-CM | POA: Insufficient documentation

## 2010-10-19 DIAGNOSIS — E119 Type 2 diabetes mellitus without complications: Secondary | ICD-10-CM | POA: Insufficient documentation

## 2010-10-20 ENCOUNTER — Emergency Department (HOSPITAL_COMMUNITY): Payer: Self-pay

## 2011-02-13 LAB — GC/CHLAMYDIA PROBE AMP, GENITAL: GC Probe Amp, Genital: NEGATIVE

## 2011-02-13 LAB — DIFFERENTIAL
Eosinophils Absolute: 0.1
Eosinophils Relative: 1
Lymphocytes Relative: 16
Lymphs Abs: 1.5
Monocytes Absolute: 0.5

## 2011-02-13 LAB — CBC
HCT: 36
Hemoglobin: 12.1
MCV: 75.4 — ABNORMAL LOW
Platelets: 267
RDW: 15 — ABNORMAL HIGH

## 2011-02-13 LAB — URINALYSIS, ROUTINE W REFLEX MICROSCOPIC
Glucose, UA: NEGATIVE
Nitrite: NEGATIVE
Specific Gravity, Urine: 1.015
pH: 5.5

## 2011-02-13 LAB — BASIC METABOLIC PANEL
BUN: 12
Chloride: 108
GFR calc non Af Amer: >60
Glucose, Bld: 94
Potassium: 4.4
Sodium: 138

## 2011-02-13 LAB — URINE MICROSCOPIC-ADD ON

## 2011-02-13 LAB — URINE CULTURE: Colony Count: 30000

## 2011-02-13 LAB — RPR TITER: RPR Titer: 1:8 {titer} — AB

## 2011-02-28 LAB — TPPA: Treponema Confirm: REACTIVE — AB

## 2011-06-23 ENCOUNTER — Encounter (HOSPITAL_COMMUNITY): Payer: Self-pay

## 2011-06-23 ENCOUNTER — Emergency Department (HOSPITAL_COMMUNITY)
Admission: EM | Admit: 2011-06-23 | Discharge: 2011-06-23 | Disposition: A | Discharge status: Other Acute Inpatient Hospital | Payer: Medicaid Other | Attending: Emergency Medicine | Admitting: Emergency Medicine

## 2011-06-23 ENCOUNTER — Inpatient Hospital Stay (HOSPITAL_COMMUNITY)
Admission: AD | Admit: 2011-06-23 | Discharge: 2011-07-03 | DRG: 885 | Disposition: A | Discharge status: Home / Self Care | Payer: PRIVATE HEALTH INSURANCE | Source: Ambulatory Visit | Attending: Psychiatry | Admitting: Psychiatry

## 2011-06-23 DIAGNOSIS — F14159 Cocaine abuse with cocaine-induced psychotic disorder, unspecified: Secondary | ICD-10-CM | POA: Diagnosis present

## 2011-06-23 DIAGNOSIS — F4310 Post-traumatic stress disorder, unspecified: Secondary | ICD-10-CM | POA: Diagnosis present

## 2011-06-23 DIAGNOSIS — G40909 Epilepsy, unspecified, not intractable, without status epilepticus: Secondary | ICD-10-CM

## 2011-06-23 DIAGNOSIS — F3110 Bipolar disorder, current episode manic without psychotic features, unspecified: Principal | ICD-10-CM

## 2011-06-23 DIAGNOSIS — R45851 Suicidal ideations: Secondary | ICD-10-CM

## 2011-06-23 DIAGNOSIS — F1994 Other psychoactive substance use, unspecified with psychoactive substance-induced mood disorder: Secondary | ICD-10-CM

## 2011-06-23 DIAGNOSIS — Z794 Long term (current) use of insulin: Secondary | ICD-10-CM

## 2011-06-23 DIAGNOSIS — F411 Generalized anxiety disorder: Secondary | ICD-10-CM

## 2011-06-23 DIAGNOSIS — F329 Major depressive disorder, single episode, unspecified: Secondary | ICD-10-CM

## 2011-06-23 DIAGNOSIS — E119 Type 2 diabetes mellitus without complications: Secondary | ICD-10-CM

## 2011-06-23 DIAGNOSIS — F29 Unspecified psychosis not due to a substance or known physiological condition: Secondary | ICD-10-CM

## 2011-06-23 DIAGNOSIS — Z79899 Other long term (current) drug therapy: Secondary | ICD-10-CM

## 2011-06-23 DIAGNOSIS — F1410 Cocaine abuse, uncomplicated: Secondary | ICD-10-CM | POA: Diagnosis present

## 2011-06-23 DIAGNOSIS — F1910 Other psychoactive substance abuse, uncomplicated: Secondary | ICD-10-CM

## 2011-06-23 DIAGNOSIS — Z5900 Homelessness unspecified: Secondary | ICD-10-CM

## 2011-06-23 DIAGNOSIS — F3111 Bipolar disorder, current episode manic without psychotic features, mild: Secondary | ICD-10-CM | POA: Diagnosis present

## 2011-06-23 DIAGNOSIS — F319 Bipolar disorder, unspecified: Secondary | ICD-10-CM | POA: Insufficient documentation

## 2011-06-23 DIAGNOSIS — F1420 Cocaine dependence, uncomplicated: Secondary | ICD-10-CM

## 2011-06-23 DIAGNOSIS — R0602 Shortness of breath: Secondary | ICD-10-CM

## 2011-06-23 DIAGNOSIS — R45850 Homicidal ideations: Secondary | ICD-10-CM | POA: Insufficient documentation

## 2011-06-23 HISTORY — DX: Post-traumatic stress disorder, unspecified: F43.10

## 2011-06-23 HISTORY — DX: Anxiety disorder, unspecified: F41.9

## 2011-06-23 LAB — CBC
HCT: 37.5 % (ref 36.0–46.0)
Hemoglobin: 12.9 g/dL (ref 12.0–15.0)
MCH: 24.5 pg — ABNORMAL LOW (ref 26.0–34.0)
MCHC: 34.4 g/dL (ref 30.0–36.0)
MCV: 71.2 fL — ABNORMAL LOW (ref 78.0–100.0)
Platelets: 237 K/uL (ref 150–400)
RBC: 5.27 MIL/uL — ABNORMAL HIGH (ref 3.87–5.11)
RDW: 15.4 % (ref 11.5–15.5)
WBC: 7.2 K/uL (ref 4.0–10.5)

## 2011-06-23 LAB — URINALYSIS, ROUTINE W REFLEX MICROSCOPIC
Bilirubin Urine: NEGATIVE
Hgb urine dipstick: NEGATIVE
Specific Gravity, Urine: 1.026 (ref 1.005–1.030)
pH: 7 (ref 5.0–8.0)

## 2011-06-23 LAB — URINE MICROSCOPIC-ADD ON

## 2011-06-23 LAB — COMPREHENSIVE METABOLIC PANEL
ALT: 16 U/L (ref 0–35)
Albumin: 3.9 g/dL (ref 3.5–5.2)
Alkaline Phosphatase: 49 U/L (ref 39–117)
Chloride: 101 mEq/L (ref 96–112)
GFR calc Af Amer: >90 mL/min (ref >90)
Glucose, Bld: 84 mg/dL (ref 70–99)
Potassium: 4 mEq/L (ref 3.5–5.1)
Sodium: 133 mEq/L — ABNORMAL LOW (ref 135–145)
Total Protein: 8 g/dL (ref 6.0–8.3)

## 2011-06-23 LAB — RAPID URINE DRUG SCREEN, HOSP PERFORMED
Amphetamines: NOT DETECTED
Cocaine: POSITIVE — AB
Opiates: NOT DETECTED
Tetrahydrocannabinol: NOT DETECTED

## 2011-06-23 LAB — DIFFERENTIAL
Eosinophils Absolute: 0 10*3/uL (ref 0.0–0.7)
Lymphs Abs: 1.5 10*3/uL (ref 0.7–4.0)
Neutro Abs: 5.3 10*3/uL (ref 1.7–7.7)
Neutrophils Relative %: 73 % (ref 43–77)

## 2011-06-23 LAB — GLUCOSE, CAPILLARY: Glucose-Capillary: 108 mg/dL — ABNORMAL HIGH (ref 70–99)

## 2011-06-23 MED ORDER — INSULIN ASPART 100 UNIT/ML ~~LOC~~ SOLN
0.0000 [IU] | SUBCUTANEOUS | Status: DC
  Filled 2011-06-23: qty 3

## 2011-06-23 MED ORDER — ZOLPIDEM TARTRATE 5 MG PO TABS: 5.0000 mg | ORAL_TABLET | Freq: Every evening | ORAL | Status: DC | PRN

## 2011-06-23 MED ORDER — CIPROFLOXACIN HCL 500 MG PO TABS
500.0000 mg | ORAL_TABLET | Freq: Two times a day (BID) | ORAL | Status: DC
  Administered 2011-06-23: 500 mg via ORAL
  Filled 2011-06-23: qty 1

## 2011-06-23 MED ORDER — ACETAMINOPHEN 325 MG PO TABS: 650.0000 mg | ORAL_TABLET | ORAL | Status: DC | PRN

## 2011-06-23 MED ORDER — QUETIAPINE FUMARATE 50 MG PO TABS
50.0000 mg | ORAL_TABLET | Freq: Two times a day (BID) | ORAL | Status: DC
  Administered 2011-06-23 (×2): 50 mg via ORAL
  Filled 2011-06-23 (×2): qty 1

## 2011-06-23 MED ORDER — ALUM & MAG HYDROXIDE-SIMETH 200-200-20 MG/5ML PO SUSP: 30.0000 mL | ORAL | Status: DC | PRN

## 2011-06-23 MED ORDER — ONDANSETRON HCL 4 MG PO TABS: 4.0000 mg | ORAL_TABLET | Freq: Three times a day (TID) | ORAL | Status: DC | PRN

## 2011-06-23 MED ORDER — NICOTINE 21 MG/24HR TD PT24
21.0000 mg | MEDICATED_PATCH | Freq: Every day | TRANSDERMAL | Status: DC
Start: 2011-06-23 — End: 2011-06-23
  Administered 2011-06-23: 21 mg via TRANSDERMAL
  Filled 2011-06-23: qty 1

## 2011-06-23 MED ORDER — METFORMIN HCL 500 MG PO TABS
500.0000 mg | ORAL_TABLET | Freq: Every day | ORAL | Status: DC
  Administered 2011-06-23: 500 mg via ORAL
  Filled 2011-06-23 (×3): qty 1

## 2011-06-23 MED ORDER — IBUPROFEN 600 MG PO TABS: 600.0000 mg | ORAL_TABLET | Freq: Three times a day (TID) | ORAL | Status: DC | PRN

## 2011-06-23 MED ORDER — LORAZEPAM 1 MG PO TABS
1.0000 mg | ORAL_TABLET | Freq: Three times a day (TID) | ORAL | Status: DC | PRN
  Administered 2011-06-23 (×2): 1 mg via ORAL
  Filled 2011-06-23 (×2): qty 1

## 2011-06-23 NOTE — ED Notes (Signed)
Pt. Has 5 belonging bags and they are locked in the activity room.

## 2011-06-23 NOTE — ED Notes (Signed)
Per pt. Request, given snack/diet drink.
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
## 2011-06-23 NOTE — ED Notes (Signed)
Per pt. Request, given sandwich/drink. 

## 2011-06-23 NOTE — Discharge Instructions (Signed)
Go to behavioral health 

## 2011-06-23 NOTE — ED Notes (Signed)
Report called to Selena Batten RN at [facility]. Pt prepared for transport.

## 2011-06-23 NOTE — ED Notes (Signed)
Pt has been accepted to [facility] by Dr. Koren Shiver bed 307-1. EDP has been notified and is in agreement with disposition. RN made aware to call report. All support paperwork has been completed and sent to [facility] for review. No further CSW needs identified at this time.

## 2011-06-23 NOTE — ED Notes (Signed)
CBG 91 

## 2011-06-23 NOTE — ED Notes (Signed)
Pt. Went to the restroom and forgot to urinate in the cup and is unable to go at this time.

## 2011-06-23 NOTE — ED Notes (Signed)
Pt departs unit in safe and stable condition en route to [facility], transported by NT and hospital security.
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
## 2011-06-23 NOTE — ED Notes (Signed)
Pt. Has new request to be made XXX, informed registration, they will amend her information and provide pt. With new armband and have pt. Sign new registration forms to become XXX.

## 2011-06-23 NOTE — ED Notes (Signed)
Pt complains of being suicidal for two days, has been off her meds since Sept, she states she has been around and around with Dr's and practitioners, she doesn't feel like she's getting any help. Two days ago she attempted suicide twice with a knife and pills

## 2011-06-23 NOTE — ED Notes (Addendum)
Pt accepted to [facility] on 300 hall. Asked to call report and send pt per [facility] at 2240.

## 2011-06-24 ENCOUNTER — Encounter (HOSPITAL_COMMUNITY): Payer: Self-pay | Admitting: *Deleted

## 2011-06-24 DIAGNOSIS — F3111 Bipolar disorder, current episode manic without psychotic features, mild: Secondary | ICD-10-CM | POA: Diagnosis present

## 2011-06-24 DIAGNOSIS — F4310 Post-traumatic stress disorder, unspecified: Secondary | ICD-10-CM | POA: Diagnosis present

## 2011-06-24 DIAGNOSIS — F1410 Cocaine abuse, uncomplicated: Secondary | ICD-10-CM | POA: Diagnosis present

## 2011-06-24 DIAGNOSIS — Z590 Homelessness: Secondary | ICD-10-CM

## 2011-06-24 DIAGNOSIS — F14159 Cocaine abuse with cocaine-induced psychotic disorder, unspecified: Secondary | ICD-10-CM | POA: Diagnosis present

## 2011-06-24 LAB — GLUCOSE, CAPILLARY: Glucose-Capillary: 99 mg/dL (ref 70–99)

## 2011-06-24 MED ORDER — PNEUMOCOCCAL VAC POLYVALENT 25 MCG/0.5ML IJ INJ
0.5000 mL | INJECTION | INTRAMUSCULAR | Status: AC
Start: 2011-06-25 — End: 2011-06-26

## 2011-06-24 MED ORDER — ACETAMINOPHEN 325 MG PO TABS: 650.0000 mg | ORAL_TABLET | Freq: Four times a day (QID) | ORAL | Status: DC | PRN

## 2011-06-24 MED ORDER — INFLUENZA VIRUS VACC SPLIT PF IM SUSP: 0.5000 mL | INTRAMUSCULAR | Status: AC

## 2011-06-24 MED ORDER — QUETIAPINE FUMARATE ER 50 MG PO TB24
50.0000 mg | ORAL_TABLET | ORAL | Status: DC
Start: 2011-06-24 — End: 2011-06-24
  Administered 2011-06-24: 50 mg via ORAL
  Filled 2011-06-24 (×4): qty 1

## 2011-06-24 MED ORDER — CHLORDIAZEPOXIDE HCL 25 MG PO CAPS: 25.0000 mg | ORAL_CAPSULE | Freq: Three times a day (TID) | ORAL | Status: DC

## 2011-06-24 MED ORDER — QUETIAPINE FUMARATE 50 MG PO TABS
50.0000 mg | ORAL_TABLET | Freq: Three times a day (TID) | ORAL | Status: DC
  Administered 2011-06-24: 50 mg via ORAL
  Filled 2011-06-24: qty 1

## 2011-06-24 MED ORDER — ALUM & MAG HYDROXIDE-SIMETH 200-200-20 MG/5ML PO SUSP: 30.0000 mL | ORAL | Status: DC | PRN

## 2011-06-24 MED ORDER — MAGNESIUM HYDROXIDE 400 MG/5ML PO SUSP: 30.0000 mL | Freq: Every day | ORAL | Status: DC | PRN

## 2011-06-24 MED ORDER — HYDROXYZINE HCL 25 MG PO TABS
25.0000 mg | ORAL_TABLET | Freq: Four times a day (QID) | ORAL | Status: DC | PRN
  Administered 2011-06-24: 25 mg via ORAL

## 2011-06-24 MED ORDER — THIAMINE HCL 100 MG/ML IJ SOLN
100.0000 mg | Freq: Once | INTRAMUSCULAR | Status: AC
  Administered 2011-06-24: 100 mg via INTRAMUSCULAR

## 2011-06-24 MED ORDER — ONDANSETRON 4 MG PO TBDP: 4.0000 mg | ORAL_TABLET | Freq: Four times a day (QID) | ORAL | Status: AC | PRN

## 2011-06-24 MED ORDER — CHLORDIAZEPOXIDE HCL 25 MG PO CAPS: 25.0000 mg | ORAL_CAPSULE | Freq: Every day | ORAL | Status: DC

## 2011-06-24 MED ORDER — ADULT MULTIVITAMIN W/MINERALS CH
1.0000 | ORAL_TABLET | Freq: Every day | ORAL | Status: DC
  Administered 2011-06-24 – 2011-07-03 (×10): 1 via ORAL
  Filled 2011-06-24 (×11): qty 1

## 2011-06-24 MED ORDER — CHLORDIAZEPOXIDE HCL 25 MG PO CAPS
25.0000 mg | ORAL_CAPSULE | Freq: Four times a day (QID) | ORAL | Status: DC
  Administered 2011-06-24: 25 mg via ORAL
  Filled 2011-06-24: qty 1

## 2011-06-24 MED ORDER — LOPERAMIDE HCL 2 MG PO CAPS: 2.0000 mg | ORAL_CAPSULE | ORAL | Status: AC | PRN

## 2011-06-24 MED ORDER — INSULIN ASPART 100 UNIT/ML ~~LOC~~ SOLN
0.0000 [IU] | Freq: Three times a day (TID) | SUBCUTANEOUS | Status: DC
  Administered 2011-06-24: 1 [IU] via SUBCUTANEOUS
  Administered 2011-06-26 – 2011-06-28 (×2): 2 [IU] via SUBCUTANEOUS
  Administered 2011-06-28 – 2011-07-03 (×5): 1 [IU] via SUBCUTANEOUS
  Filled 2011-06-24: qty 3

## 2011-06-24 MED ORDER — VITAMIN B-1 100 MG PO TABS
100.0000 mg | ORAL_TABLET | Freq: Every day | ORAL | Status: DC
  Administered 2011-06-24 – 2011-07-03 (×10): 100 mg via ORAL
  Filled 2011-06-24 (×11): qty 1

## 2011-06-24 MED ORDER — CIPROFLOXACIN HCL 500 MG PO TABS
500.0000 mg | ORAL_TABLET | Freq: Two times a day (BID) | ORAL | Status: DC
  Administered 2011-06-24 – 2011-07-03 (×18): 500 mg via ORAL
  Filled 2011-06-24 (×23): qty 1

## 2011-06-24 MED ORDER — LORAZEPAM 1 MG PO TABS
2.0000 mg | ORAL_TABLET | Freq: Four times a day (QID) | ORAL | Status: DC | PRN
  Administered 2011-06-24 – 2011-06-27 (×9): 2 mg via ORAL
  Filled 2011-06-24 (×6): qty 2
  Filled 2011-06-24: qty 1
  Filled 2011-06-24: qty 2
  Filled 2011-06-24: qty 1
  Filled 2011-06-24: qty 2

## 2011-06-24 MED ORDER — QUETIAPINE FUMARATE 50 MG PO TABS
50.0000 mg | ORAL_TABLET | Freq: Three times a day (TID) | ORAL | Status: DC
  Administered 2011-06-24 – 2011-06-25 (×2): 50 mg via ORAL
  Filled 2011-06-24 (×3): qty 1

## 2011-06-24 MED ORDER — INSULIN GLARGINE 100 UNIT/ML ~~LOC~~ SOLN: 1.0000 [IU] | Freq: Every day | SUBCUTANEOUS | Status: DC

## 2011-06-24 MED ORDER — METFORMIN HCL 500 MG PO TABS
500.0000 mg | ORAL_TABLET | Freq: Every day | ORAL | Status: DC
  Administered 2011-06-25 – 2011-07-03 (×9): 500 mg via ORAL
  Filled 2011-06-24 (×11): qty 1

## 2011-06-24 MED ORDER — CHLORDIAZEPOXIDE HCL 25 MG PO CAPS: 25.0000 mg | ORAL_CAPSULE | ORAL | Status: DC

## 2011-06-24 MED ORDER — NICOTINE 21 MG/24HR TD PT24
21.0000 mg | MEDICATED_PATCH | Freq: Every day | TRANSDERMAL | Status: DC
  Administered 2011-06-24 – 2011-07-03 (×11): 21 mg via TRANSDERMAL
  Filled 2011-06-24 (×12): qty 1

## 2011-06-24 MED ORDER — QUETIAPINE FUMARATE 50 MG PO TABS
50.0000 mg | ORAL_TABLET | Freq: Four times a day (QID) | ORAL | Status: DC | PRN
  Administered 2011-06-24 – 2011-06-28 (×6): 50 mg via ORAL
  Filled 2011-06-24: qty 1
  Filled 2011-06-24: qty 3
  Filled 2011-06-24 (×5): qty 1

## 2011-06-24 MED ORDER — CHLORDIAZEPOXIDE HCL 25 MG PO CAPS
25.0000 mg | ORAL_CAPSULE | Freq: Four times a day (QID) | ORAL | Status: DC | PRN
  Administered 2011-06-24: 25 mg via ORAL
  Filled 2011-06-24 (×2): qty 1

## 2011-06-24 MED ORDER — ZOLPIDEM TARTRATE 5 MG PO TABS
5.0000 mg | ORAL_TABLET | Freq: Every evening | ORAL | Status: DC | PRN
  Administered 2011-06-24: 5 mg via ORAL
  Filled 2011-06-24: qty 1

## 2011-06-24 MED ORDER — QUETIAPINE FUMARATE 50 MG PO TABS
50.0000 mg | ORAL_TABLET | Freq: Once | ORAL | Status: AC
  Administered 2011-06-24: 50 mg via ORAL
  Filled 2011-06-24: qty 1

## 2011-06-24 MED ORDER — QUETIAPINE FUMARATE 50 MG PO TABS: 50.0000 mg | ORAL_TABLET | Freq: Every day | ORAL | Status: DC
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
## 2011-06-24 NOTE — Progress Notes (Signed)
[facility] Group Notes:  (Counselor/Nursing/MHT/Case Management/Adjunct)  06/24/2011 5:50 PM  Type of Therapy:  Group Therapy  Participation Level:  Did Not Attend   Clide Dales 06/24/2011, 5:50 PM

## 2011-06-24 NOTE — Tx Team (Signed)
Initial Interdisciplinary Treatment Plan  PATIENT STRENGTHS: (choose at least two) Ability for insight Active sense of humor Average or above average intelligence Capable of independent living Communication skills General fund of knowledge Motivation for treatment/growth Physical Health Work skills  PATIENT STRESSORS: Financial difficulties Health problems Marital or family conflict Medication change or noncompliance Occupational concerns Substance abuse   PROBLEM LIST: Problem List/Patient Goals Date to be addressed Date deferred Reason deferred Estimated date of resolution  "staying clean of crack cocaine" 06/24/11     "Keeping a positive mind, never nothing negative" 06/24/11     "my attitude" 06/24/11           depression 06/24/11     Anxiety do 06/24/11     Increased risk of suicide 06/24/11                  DISCHARGE CRITERIA:  Ability to meet basic life and health needs Adequate post-discharge living arrangements Improved stabilization in mood, thinking, and/or behavior Motivation to continue treatment in a less acute level of care Need for constant or close observation no longer present Reduction of life-threatening or endangering symptoms to within safe limits Safe-care adequate arrangements made Verbal commitment to aftercare and medication compliance Withdrawal symptoms are absent or subacute and managed without 24-hour nursing intervention  PRELIMINARY DISCHARGE PLAN: Attend aftercare/continuing care group Attend 12-step recovery group Outpatient therapy Participate in family therapy Placement in alternative living arrangements Return to previous living arrangement  PATIENT/FAMIILY INVOLVEMENT: This treatment plan has been presented to and reviewed with the patient, [name], and/or family member.  The patient and family have been given the opportunity to ask questions and make suggestions.  Kathleen [name] 06/24/2011, 12:25 AM
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
## 2011-06-24 NOTE — Progress Notes (Signed)
1:1 note for patient was written late. Notated on sitter's board for 2215, note is late for 2100. Will resume next RN note at 0100. At 2215, patient was awakened to receive HS medications. No signs of distress noted. Respirations even, normal and unlabored. Sitter at patient's side. Continue 1:1 to ensure patient safety. Patient remains safe while on the unit.
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
## 2011-06-24 NOTE — Progress Notes (Signed)
Patient ID: Kathleen [name], female   DOB: [birth year], 46 y.o.   MRN: [id number] Patient has been labile, irritable this evening. Patient reports agitation and anxiety this evening. Explained to patient that she has received dose of ativan at 1616 and would not be able to receive another dose for 6 hours. Spoke to patient about calling MD on call to see if anything else would be available for patient. Received PRN dose of seroquel that can be given q6hrs. Patient received first dose at 2005 along with scheduled dose of cipro. Order was also received by PA on unit for ambien 5mg  MRx1 that patient may receive tonight, patient very appreciative. Will continue to monitor patient as her mood has been labile and agitated this evening. Patient still placed on a 1:1 as she is not able to contract for safety. Sitter at patient's side.

## 2011-06-24 NOTE — Progress Notes (Signed)
Pt has been very labile, agitated, irritable and demanding. See 1:1 note for more information

## 2011-06-25 LAB — GLUCOSE, CAPILLARY
Glucose-Capillary: 92 mg/dL (ref 70–99)
Glucose-Capillary: 122 mg/dL — ABNORMAL HIGH (ref 70–99)

## 2011-06-25 LAB — HEMOGLOBIN A1C
Hgb A1c MFr Bld: 5.7 % — ABNORMAL HIGH (ref <5.7)
Mean Plasma Glucose: 117 mg/dL — ABNORMAL HIGH (ref <117)

## 2011-06-25 MED ORDER — QUETIAPINE FUMARATE 300 MG PO TABS: 300.0000 mg | ORAL_TABLET | ORAL | Status: DC

## 2011-06-25 MED ORDER — CHLORDIAZEPOXIDE HCL 25 MG PO CAPS
25.0000 mg | ORAL_CAPSULE | Freq: Two times a day (BID) | ORAL | Status: AC
  Administered 2011-06-26 (×2): 25 mg via ORAL
  Filled 2011-06-25 (×2): qty 1

## 2011-06-25 MED ORDER — QUETIAPINE FUMARATE 100 MG PO TABS
100.0000 mg | ORAL_TABLET | Freq: Two times a day (BID) | ORAL | Status: DC
  Administered 2011-06-25: 100 mg via ORAL

## 2011-06-25 MED ORDER — QUETIAPINE FUMARATE 200 MG PO TABS
200.0000 mg | ORAL_TABLET | Freq: Every day | ORAL | Status: DC
  Administered 2011-06-25 – 2011-06-28 (×4): 200 mg via ORAL
  Filled 2011-06-25 (×5): qty 1

## 2011-06-25 MED ORDER — TRAZODONE HCL 50 MG PO TABS
50.0000 mg | ORAL_TABLET | Freq: Every day | ORAL | Status: DC
  Administered 2011-06-27 – 2011-07-02 (×6): 50 mg via ORAL
  Filled 2011-06-25 (×11): qty 1

## 2011-06-25 MED ORDER — CHLORDIAZEPOXIDE HCL 25 MG PO CAPS
25.0000 mg | ORAL_CAPSULE | Freq: Four times a day (QID) | ORAL | Status: AC
  Administered 2011-06-25 (×3): 25 mg via ORAL
  Filled 2011-06-25 (×3): qty 1

## 2011-06-25 MED ORDER — QUETIAPINE FUMARATE 100 MG PO TABS
100.0000 mg | ORAL_TABLET | ORAL | Status: DC
  Filled 2011-06-25 (×2): qty 1

## 2011-06-25 MED ORDER — QUETIAPINE FUMARATE 100 MG PO TABS
100.0000 mg | ORAL_TABLET | Freq: Two times a day (BID) | ORAL | Status: DC
  Administered 2011-06-25 – 2011-06-29 (×8): 100 mg via ORAL
  Filled 2011-06-25 (×10): qty 1

## 2011-06-25 MED ORDER — CHLORDIAZEPOXIDE HCL 25 MG PO CAPS
25.0000 mg | ORAL_CAPSULE | Freq: Every day | ORAL | Status: AC
  Administered 2011-06-27: 25 mg via ORAL
  Filled 2011-06-25: qty 1

## 2011-06-25 NOTE — Progress Notes (Signed)
[facility] Group Notes:  (Counselor/Nursing/MHT/Case Management/Adjunct)  06/25/2011 11:59 AM  Type of Therapy:  Group Therapy  Participation Level:  minimal  Participation Quality:  Drowsy  Affect:  Depressed  Cognitive:  Appropriate  Insight:  None  Engagement in Group:  None  Engagement in Therapy:  None  Modes of Intervention:  Support  Summary of Progress/Problems:   Luanna Cole 06/25/2011, 11:59 AM

## 2011-06-25 NOTE — Progress Notes (Signed)
[facility] MD Progress Note  06/25/2011 3:19 PM  Diagnosis:   Cocaine Dependence; PTSD; DM; r/o Mood Disorder NOS; Hx reported BPAD     ADL's:  Impaired  Sleep: Poor  Appetite:  Fair  Suicidal Ideation:  Yes per patient. Homicidal Ideation:  Denies.   Tilley is still very labile and easily agitated. She is loud, crying, and demanding medication for her anxiety. She states she will "kill herself" if her anxiety is not treated with xanax.  She is easily redirected.  AVital Signs:Blood pressure 129/82, pulse 99, temperature 98 F (36.7 C), temperature source Oral, resp. rate 18, height 5\' 3"  (1.6 m), weight 72.122 kg (159 lb), last menstrual period 06/01/2011.  Despite 2mg  Ativan within last 2 hours, and 50mg  of Seroquel the patient is loud, agitated, pacing, irritable, and labile. She is disheveled with dirty clothes. She is circumstantial in her speech and rambles a bit. Current Medications: Current Facility-Administered Medications  Medication Dose Route Frequency Provider Last Rate Last Dose  . acetaminophen (TYLENOL) tablet 650 mg  650 mg Oral Q6H PRN Alyson Kuroski-Mazzei, DO      . alum & mag hydroxide-simeth (MAALOX/MYLANTA) 200-200-20 MG/5ML suspension 30 mL  30 mL Oral Q4H PRN Alyson Kuroski-Mazzei, DO      . chlordiazePOXIDE (LIBRIUM) capsule 25 mg  25 mg Oral QID Verne Spurr, PA   25 mg at 06/25/11 1237   Followed by  . chlordiazePOXIDE (LIBRIUM) capsule 25 mg  25 mg Oral BID Verne Spurr, PA       Followed by  . chlordiazePOXIDE (LIBRIUM) capsule 25 mg  25 mg Oral Daily Verne Spurr, [place]      . ciprofloxacin (CIPRO) tablet 500 mg  500 mg Oral BID Viviann Spare, NP   500 mg at 06/25/11 0850  . influenza  inactive virus vaccine (FLUZONE/FLUARIX) injection 0.5 mL  0.5 mL Intramuscular Tomorrow-1000 Alyson Kuroski-Mazzei, DO      . insulin aspart (novoLOG) injection 0-9 Units  0-9 Units Subcutaneous TID WC Verne Spurr, PA   1 Units at 06/24/11 1745  . loperamide (IMODIUM)  capsule 2-4 mg  2-4 mg Oral PRN Alyson Kuroski-Mazzei, DO      . LORazepam (ATIVAN) tablet 2 mg  2 mg Oral Q6H PRN Alyson Kuroski-Mazzei, DO   2 mg at 06/25/11 1236  . magnesium hydroxide (MILK OF MAGNESIA) suspension 30 mL  30 mL Oral Daily PRN Alyson Kuroski-Mazzei, DO      . metFORMIN (GLUCOPHAGE) tablet 500 mg  500 mg Oral Q breakfast Verne Spurr, PA   500 mg at 06/25/11 1236  . mulitivitamin with minerals tablet 1 tablet  1 tablet Oral Daily Alyson Kuroski-Mazzei, DO   1 tablet at 06/25/11 0851  . nicotine (NICODERM CQ - dosed in mg/24 hours) patch 21 mg  21 mg Transdermal Q0600 Alyson Kuroski-Mazzei, DO   21 mg at 06/25/11 4098  . ondansetron (ZOFRAN-ODT) disintegrating tablet 4 mg  4 mg Oral Q6H PRN Alyson Kuroski-Mazzei, DO      . pneumococcal 23 valent vaccine (PNU-IMMUNE) injection 0.5 mL  0.5 mL Intramuscular Tomorrow-1000 Alyson Kuroski-Mazzei, DO      . QUEtiapine (SEROQUEL) tablet 100 mg  100 mg Oral BID Verne Spurr, PA      . QUEtiapine (SEROQUEL) tablet 200 mg  200 mg Oral QHS Verne Spurr, PA      . QUEtiapine (SEROQUEL) tablet 50 mg  50 mg Oral Q6H PRN Franchot Gallo, MD   50 mg at 06/25/11 1191  .  thiamine (VITAMIN B-1) tablet 100 mg  100 mg Oral Daily Alyson Kuroski-Mazzei, DO   100 mg at 06/25/11 0851  . traZODone (DESYREL) tablet 50 mg  50 mg Oral QHS Verne Spurr, [place]      . DISCONTD: chlordiazePOXIDE (LIBRIUM) capsule 25 mg  25 mg Oral Q6H PRN Alyson Kuroski-Mazzei, DO   25 mg at 06/24/11 1200  . DISCONTD: hydrOXYzine (ATARAX/VISTARIL) tablet 25 mg  25 mg Oral Q6H PRN Alyson Kuroski-Mazzei, DO   25 mg at 06/24/11 0105  . DISCONTD: QUEtiapine (SEROQUEL XR) 24 hr tablet 50 mg  50 mg Oral BH-qamhs Viviann Spare, NP   50 mg at 06/24/11 0835  . DISCONTD: QUEtiapine (SEROQUEL) tablet 100 mg  100 mg Oral BID Verne Spurr, PA   100 mg at 06/25/11 0920  . DISCONTD: QUEtiapine (SEROQUEL) tablet 100 mg  100 mg Oral BH-qamhs Verne Spurr, [place]      . DISCONTD: QUEtiapine  (SEROQUEL) tablet 300 mg  300 mg Oral BH-qamhs Verne Spurr, [place]      . DISCONTD: QUEtiapine (SEROQUEL) tablet 50 mg  50 mg Oral TID Alyson Kuroski-Mazzei, DO   50 mg at 06/24/11 1616  . DISCONTD: QUEtiapine (SEROQUEL) tablet 50 mg  50 mg Oral TID Franchot Gallo, MD   50 mg at 06/25/11 0851  . DISCONTD: zolpidem (AMBIEN) tablet 5 mg  5 mg Oral QHS PRN,MR X 1 Mickie D. Pernell Dupre, PA   5 mg at 06/24/11 2218    Lab Results:  Results for orders placed during the hospital encounter of 06/23/11 (from the past 48 hour(s))  GLUCOSE, CAPILLARY     Status: Abnormal   Collection Time   06/24/11  6:24 AM      Component Value Range Comment   Glucose-Capillary 152 (*) 70 - 99 (mg/dL)    Comment 1 Notify RN     GLUCOSE, CAPILLARY     Status: Normal   Collection Time   06/24/11 11:40 AM      Component Value Range Comment   Glucose-Capillary 99  70 - 99 (mg/dL)   GLUCOSE, CAPILLARY     Status: Abnormal   Collection Time   06/24/11  4:33 PM      Component Value Range Comment   Glucose-Capillary 136 (*) 70 - 99 (mg/dL)   HEMOGLOBIN Z6X     Status: Abnormal   Collection Time   06/24/11  8:07 PM      Component Value Range Comment   Hemoglobin A1C 5.7 (*) <5.7 (%)    Mean Plasma Glucose 117 (*) <117 (mg/dL)   GLUCOSE, CAPILLARY     Status: Abnormal   Collection Time   06/24/11  9:08 PM      Component Value Range Comment   Glucose-Capillary 104 (*) 70 - 99 (mg/dL)   GLUCOSE, CAPILLARY     Status: Abnormal   Collection Time   06/25/11  6:50 AM      Component Value Range Comment   Glucose-Capillary 103 (*) 70 - 99 (mg/dL)   GLUCOSE, CAPILLARY     Status: Normal   Collection Time   06/25/11 11:48 AM      Component Value Range Comment   Glucose-Capillary 92  70 - 99 (mg/dL)    Comment 1 Notify RN        CIWA:  CIWA-Ar Total: 6  COWS:    Assessment: Pt. Is still manic and labile.   Treatment Plan Summary: Daily contact with patient to assess and evaluate symptoms and  progress in treatment. Medications  are reviewed with Pharm D. Medications are reviewed and patient is given 200mg  total of seroquel this morning with minimal reponse.  Lorazipam will be discontinued and Librium taper started as Seroquel is titrated upward to see a response and a decrease in symptoms.  Pt. Will remain on 1:1   Camara Rosander T. Mathea Frieling PAC 06/25/2011, 3:19 PM

## 2011-06-25 NOTE — Tx Team (Signed)
Interdisciplinary Treatment Plan Update (Adult)  Date:  06/25/2011  Time Reviewed:  9:45 AM   Progress in Treatment: Attending groups: Yes Participating in groups:  Yes Taking medication as prescribed: Yes Tolerating medication:  Yes Family/Significant othe contact made:  Counselor assessing for appropriate contact  Patient understands diagnosis:  Yes Discussing patient identified problems/goals with staff:  Yes Medical problems stabilized or resolved:  Yes Denies suicidal/homicidal ideation: Yes Issues/concerns per patient self-inventory:  None identified Other: N/A  New problem(s) identified: None Identified  Reason for Continuation of Hospitalization: Anxiety Depression Mania Medication stabilization Suicidal ideation  Interventions implemented related to continuation of hospitalization: mood stabilization, medication monitoring and adjustment, group therapy and psycho education, safety checks q 15 mins  Additional comments: Pt is very irritated today and would not be appropriate for treatment team at this time.    Estimated length of stay: 3-5 days  Discharge Plan: SW will assess for appropriate follow up  New goal(s): N/A  Review of initial/current patient goals per problem list:    1.  Goal(s): Address substance use  Met:  No  Target date: by discharge  As evidenced by: completing detox protocol and refer to appropriate treatment  2.  Goal (s): Reduce depressive and anxiety symptoms  Met:  No  Target date: by discharge  As evidenced by: Reducing depression from a 10 to a 3 as reported by pt.    3.  Goal(s): Eliminate SI  Met:  No  Target date: by discharge  As evidenced by: Eliminate SI   Attendees: Patient:     Family:     Physician:   06/25/2011 9:45 AM   Nursing: Carolynn Comment, RN 06/25/2011 9:45 AM   Case Manager:  Reyes Ivan, LCSWA 06/25/2011  9:45 AM   Counselor:  Ronda Fairly, LCSWA 06/25/2011  9:45 AM   Other:  Richelle Ito,  LCSW 06/25/2011 9:45 AM   Other:  Cato Mulligan, RN 06/25/2011 9:47 AM   Other:  Verne Spurr, PA 06/25/2011 9:47 AM   Other:  Tanya Nones, SW intern 06/25/2011 9:47 AM    Scribe for Treatment Team:   Reyes Ivan 06/25/2011 9:45 AM

## 2011-06-25 NOTE — Progress Notes (Signed)
[facility] Group Notes:  (Counselor/Nursing/MHT/Case Management/Adjunct)  06/25/2011 4:02 PM  Type of Therapy:  Group Therapy at 1:15  Participation Level:  Did Not Attend   Kathleen [name] 06/25/2011, 4:02 PM

## 2011-06-25 NOTE — Progress Notes (Signed)
Pt attended discharge planning group and minimally participated.  Pt presents with agitated mood and affect.  Pt reports continues SI and is on a 1:1 for safety.  Pt states that she is tired of crying and being unstable.  Pt states that she is forgetting things and was put on a 1:1 for help.  Pt states that she also wants to change halls because she is not an alcoholic.  SW will discuss with tx team.  Pt unable to verbalize a d/c plan at this time.   Reyes Ivan, LCSWA 06/25/2011  9:24 AM

## 2011-06-25 NOTE — Progress Notes (Signed)
1500 Nsg 1:1 note  Patient to room after medications and prn Ativan. Began Librium protocol with explanation. Less agitated. Contracts for safety. Tech is reporting that Patient is increasingly forgetful ie: multiple phone calls of the same thing etc.  Cont to support and encourage. Cont 1:1 for safety.

## 2011-06-25 NOTE — Progress Notes (Signed)
Patient appeared to be very angry and agitated this afternoon. She reported that she the PA told her she could go for lunch and the [facility] said no. Writer explained to the patient that it was the protocol to not allow patient on 1:1 to go to the cafeteria; "I'm not a dog, I should be allowed to go to the Cafeteria, I want to go home right now". Writer encouraged patient to calm down and to try and resolve problem without getting angry and agitated. Pt seemed to always trow tantrum whenever she doesn't have her way. Writer consulted with the PA and AC to allow pt to walk to the cafeteria, pick her food and return to the unit. AC agreed to that. Pt went to cafeteria with 1-1 staff, picked her food and returned to the unit. Pt ate her food and became calm. 1:1 safety observations continues as ordered.

## 2011-06-25 NOTE — Progress Notes (Signed)
Pt continues to express agitation stating that her meds are incorrect and demanding Xanax as well as other medications to be restarted.  Passive SI with no plan r/t agitated state. Redirection successful and cont to support,encourage and offer praise and coping skills as needed.   No physical complaints. No group attendance.  Provider informed of behaviors and new oreders acknowledged. Cont 1:1 for safety as well as 15' checks.

## 2011-06-26 LAB — GLUCOSE, CAPILLARY
Glucose-Capillary: 175 mg/dL — ABNORMAL HIGH (ref 70–99)
Glucose-Capillary: 100 mg/dL — ABNORMAL HIGH (ref 70–99)
Glucose-Capillary: 140 mg/dL — ABNORMAL HIGH (ref 70–99)

## 2011-06-26 MED ORDER — OLANZAPINE 5 MG PO TABS
5.0000 mg | ORAL_TABLET | Freq: Every day | ORAL | Status: DC
  Administered 2011-06-26 – 2011-06-27 (×2): 5 mg via ORAL
  Filled 2011-06-26 (×4): qty 1

## 2011-06-26 NOTE — Progress Notes (Signed)
1000 Nsg 1:1 note- Patient is quiet and appropriate this am. Continues to have rambling speech about medications and anxiety not being relieved despite PRN medications.  Support and encouragement given and patient responds to this very well.  Unable to contract for safety. No aggressive actions since last 1:1 assessment. Cont positive reenforcement and 1:1 for safety.

## 2011-06-26 NOTE — Progress Notes (Signed)
Pt did not attend d/c planning group on this date.  SW met with pt individually at this time.  Pt presents with flat affect and depressed mood.  Pt's mother was present for a visit as well.  Pt states that she continues to be suicidal and can't think of a d/c plan at this time because she can only think about killing herself.  Pt states "I'm not going to make it out of here", referring to suicide.  Pt continues to be on a 1:1 for safety.  Pt states that she continues to feel unstable but is tired of living this way.  Pt states she wants to be stable and be back to her normal self.  Pt states she can't remember when she was normal.  SW will continue to assess for appropriate referrals.    Reyes Ivan, LCSWA 06/26/2011  1:11 PM

## 2011-06-26 NOTE — Tx Team (Signed)
Interdisciplinary Treatment Plan Update (Adult)  Date:  06/26/2011  Time Reviewed:  2:37 PM   Progress in Treatment: Attending groups: Yes Participating in groups:  Yes Taking medication as prescribed: Yes Tolerating medication:  Yes Family/Significant othe contact made:  Yes, contact made with mother Patient understands diagnosis:  Yes Discussing patient identified problems/goals with staff:  Yes Medical problems stabilized or resolved:  Yes Denies suicidal/homicidal ideation: No, pt on a 1:1 for safety Issues/concerns per patient self-inventory:  None identified Other: N/A  New problem(s) identified: None Identified  Reason for Continuation of Hospitalization: Anxiety Depression Medication stabilization Suicidal ideation  Interventions implemented related to continuation of hospitalization: mood stabilization, medication monitoring and adjustment, group therapy and psycho education, safety checks q 15 mins  Additional comments: N/A  Estimated length of stay: 3- 5 days  Discharge Plan: SW will assess for appropriate referrals  New goal(s): N/A  Review of initial/current patient goals per problem list:    1.  Goal(s): Address substance use  Met:  No  Target date: by discharge  As evidenced by: completing detox protocol and refer to appropriate treatment  2.  Goal (s): Reduce depressive and anxiety symptoms  Met:  No  Target date: by discharge  As evidenced by: Reducing depression from a 10 to a 3 as reported by pt.    3.  Goal(s): Eliminate SI  Met:  No  Target date: by discharge  As evidenced by: Pt denying SI   Attendees: Patient:  Kathleen [name] 06/26/2011 2:41 PM   Family:     Physician:  Lupe Carney, DO 06/26/2011 2:37 PM   Nursing: Carolynn Comment, RN 06/26/2011 2:37 PM   Case Manager:  Reyes Ivan, LCSWA 06/26/2011  2:37 PM   Counselor:  Ronda Fairly, LCSWA 06/26/2011  2:37 PM   Other:  Richelle Ito, LCSW 06/26/2011 2:37 PM     Other:     Other:     Other:      Scribe for Treatment Team:   Reyes Ivan 06/26/2011 2:37 PM

## 2011-06-26 NOTE — Progress Notes (Signed)
Pt. Has been anxious and labile this evening.  Snack given to pt. Per pt. 's request. Reports that she does hear voices that tell   Her to harm herself.  1:1 continued for safety.  Pt. Remains safe.

## 2011-06-26 NOTE — Progress Notes (Signed)
[facility] Group Notes:  (Counselor/Nursing/MHT/Case Management/Adjunct)  06/26/2011 4:38 PM  Type of Therapy:  Group Therapy at 11:00 and 1:15 Participation Level:  Did Not Attend   Clide Dales 06/26/2011, 4:38 PM

## 2011-06-26 NOTE — Progress Notes (Signed)
[facility] MD Progress Note  06/26/2011 4:27 PM  S/O: Pt seen and evaluated in treatment team.  Reviewed short term and long term goals, medications, current treatment in the hospital and acute/chronic safety. Pt still significantly depressed and unable to contract for safety.  Sig mood lability, c/o AH when depressed and difficulty sleeping.  Continued SI, crying spells and agitation.  Sleep:  Number of Hours: 6.5    Vital Signs:Blood pressure 115/75, pulse 101, temperature 98 F (36.7 C), temperature source Oral, resp. rate 18, height 5\' 3"  (1.6 m), weight 72.122 kg (159 lb), last menstrual period 06/01/2011.  Physical Findings: CIWA:  CIWA-Ar Total: 0   Meds:    . chlordiazePOXIDE  25 mg Oral BID   Followed by  . chlordiazePOXIDE  25 mg Oral Daily  . ciprofloxacin  500 mg Oral BID  . influenza  inactive virus vaccine  0.5 mL Intramuscular Tomorrow-1000  . insulin aspart  0-9 Units Subcutaneous TID WC  . metFORMIN  500 mg Oral Q breakfast  . mulitivitamin with minerals  1 tablet Oral Daily  . nicotine  21 mg Transdermal Q0600  . OLANZapine  5 mg Oral QHS  . pneumococcal 23 valent vaccine  0.5 mL Intramuscular Tomorrow-1000  . QUEtiapine  100 mg Oral BID  . QUEtiapine  200 mg Oral QHS  . thiamine  100 mg Oral Daily  . traZODone  50 mg Oral QHS    A/P: Cocaine Dependence; Benzodiazepine W/D; PTSD; DM; Mood Disorder NOS; Hx reported BPAD  Treatment goals and medication management reviewed with patient.  Continue current treatment plan and medications with addition of olanzapine 5mg  qhs for mood stability, c/o AH and poor sleep.  No SEs reported at current dosages.  Cont 1:1 for safety and continued SI.  Will potentially increase olanzapine and d/c Seroquel. Pt agreeable with plan.  Further collateral and medication adjustments pending.  Discussed with team.  Lupe Carney 06/26/2011, 4:27 PM

## 2011-06-26 NOTE — Progress Notes (Signed)
   1-1 note She has been up for a short  Time. She stated that she was agitated and anxious and wants everyone  Everyone to stay away from her. She refuse to say if she would contract   For safety or not. PRN of seroquel and ativan given.

## 2011-06-27 LAB — GLUCOSE, CAPILLARY
Glucose-Capillary: 110 mg/dL — ABNORMAL HIGH (ref 70–99)
Glucose-Capillary: 87 mg/dL (ref 70–99)
Glucose-Capillary: 119 mg/dL — ABNORMAL HIGH (ref 70–99)

## 2011-06-27 MED ORDER — QUETIAPINE FUMARATE 50 MG PO TABS
50.0000 mg | ORAL_TABLET | Freq: Every day | ORAL | Status: AC
  Administered 2011-06-27: 50 mg via ORAL
  Filled 2011-06-27: qty 1

## 2011-06-27 MED ORDER — LORAZEPAM 1 MG PO TABS
1.0000 mg | ORAL_TABLET | Freq: Three times a day (TID) | ORAL | Status: DC
  Administered 2011-06-27 – 2011-06-30 (×9): 1 mg via ORAL
  Filled 2011-06-27 (×9): qty 1

## 2011-06-27 NOTE — Progress Notes (Signed)
Sleeping at long intervals.  Up once, briefly:  Walked  to doorway, trance-like then immediately returned to bed and sleep.  Became combative when staff attempted to take vital signs.  Constant 1:1 monitoring and observation to maintain safety.

## 2011-06-27 NOTE — Progress Notes (Signed)
Patient ID: AMBREA HEGLER, female   DOB: 01/18/1966, 46 y.o.   MRN: [id number] Last recorded: 02/22 1000   BP: 101/67 Pulse: 112  Temp: 98.4 F (36.9 C) Resp: 20    Arilla is doing a little better today. She unfortunately had a visit from her mother, which was unwelcome, and she found out that her favorite aunt had passed away last pm.  Gwenlyn is up and dressed, with her hair combed.  She is calm, but labile, speech is circumstantial but clear and coherent.  The patient states that the new medication "did the trick" and that we "hit the nail on the head." Lachele states she slept well and reports no side effects to the medication.  She also asks that the medication that she gets every 6 hours be moved up to every 2 or 3 hours.  A.) Substance abuse, detox P.) Cherene is informed that the medication will not be moved any closer in frequency, and she will have to take it as it's written.  She stated that would be ok, and she would speak to Dr. Koren Shiver about it. 1:1 will be continued through the weekend. [name]. Margo Lama PAC

## 2011-06-27 NOTE — Progress Notes (Signed)
[facility] Group Notes:  (Counselor/Nursing/MHT/Case Management/Adjunct)  06/27/2011 12:09 PM  Type of Therapy:  Group Therapy  Participation Level:  Did Not Attend  Participation Quality:  did not attend  Affect:  Appropriate  Cognitive:  Appropriate  Insight:  None  Engagement in Group:  None  Engagement in Therapy:  None  Modes of Intervention:  Support  Summary of Progress/Problems: Did not attend group.   Mendleson, Dencil Cayson 06/27/2011, 12:09 PM

## 2011-06-27 NOTE — Progress Notes (Signed)
See notes in 1:1 flow sheet

## 2011-06-27 NOTE — Progress Notes (Signed)
Patient ID: Kathleen [name], female   DOB: 03/07/1966, 46 y.o.   MRN: [id number] Pt. Was seen and chart reviewed.  She states that she is doing ok. She is up and dressed, had applied make up.  Voices no new concerns, and continues to request that the time between her lorazepam doses be shortened again.  Her speech is less pressured but she is still labile and circumstantial.  Limited insight and still with some med seeking behaviors. Poor judgement. Denies AH/VH.   A.) PTSD, Bipolar disorder with mania, cocaine abuse, med seeking behaviors P.) Continue 1:1, and medications as written.  No changes at this time. [name].Saabir Blyth PAC

## 2011-06-27 NOTE — Progress Notes (Signed)
Patient ID: Kathleen [name], female   DOB: 06-10-1965, 46 y.o.   MRN: [id number] 06/27/2011  Nursing 1:1 for 10:00  Entered at 1200 due to computer error  D Pt seen OOB UAL in the 300 med room this AM . She is crying , she is wringing her hands and moaning...stating " I just don't know what I'm going to do...ibuprofen just don't know....". Pt states she just found out that her " favorite" aunt died in her sleep last night. A She is sobbing, rocking back and forth. She does not answer  when this nurse asks her if she can stay safe today. She states over and over  " just give me something...give me something". R Safety is maintained and pt given 2 mg PO Ativan for anxiety and sadness and 1:1 cont to maintain POC and safety. PD RN [facility]

## 2011-06-27 NOTE — Progress Notes (Signed)
[facility] Group Notes:  (Counselor/Nursing/MHT/Case Management/Adjunct)  06/27/2011 4:01 PM  Type of Therapy:  Group Therapy at 1:15  Participation Level:  Did Not Attend   Kathleen [name] 06/27/2011, 4:01 PM

## 2011-06-27 NOTE — Progress Notes (Signed)
Late 1:1 note (due at 06:00)  The patient has slept soundly throughout the night.  Pulse rate at his time is 98/minute.  Aroused briefly for CBG which is now 110 ml/dil, but back to sleep immediately. Constant 1:1 monitoring.  No physical  Complaints or behavioral problems this shift.

## 2011-06-27 NOTE — Progress Notes (Addendum)
Patient ID: MERDIS SNODGRASS, female   DOB: 30-Mar-1966, 46 y.o.   MRN: [id number] 06/27/2011  Nursing 1:1 D Pt is seen talking to counselor..standing in the med room of the 300 hall...she is tearful. Her speech is a little garbled and thick...she speaks of her pain ( regarding the recent news of the death of her aunt). She shares her feelings of pain and sorrow over this loss...that her mother was never a mother to her and that her aunt was the one that " was there for me ". She states she is thinking that she is motivated to get clean.. stay sober and drug-free....that her " aunt would want me to do this". A The PA is speaking with her at present. R Continue 1:1 per MD order and safety maintaiend . Therapeutic relationship fostered and POC cont  PD RN [facility]

## 2011-06-27 NOTE — Progress Notes (Signed)
06/27/2011  Nursing 1:1 1740 D pt is seen lying in her bed..while MHT is checking her cbg for dinnertime. She is difficult to awaken..she says she was having an awful dream, in which " the voices came back". SH is tearful...sobbing and weeping softly. She speaks of how much she's going to miss her aunt and how she just doesn't know " what she's going to do ".  A She is medicated per MD order. VS are obtained per MD and charted.  R 1:1 orer for continuation received from Dr. Shela Commons via phone. Safety is maintaiend and POC includes fostering therapeutic  Relationship already established. PD RN [facility]
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
## 2011-06-28 ENCOUNTER — Other Ambulatory Visit: Payer: Self-pay

## 2011-06-28 DIAGNOSIS — F1410 Cocaine abuse, uncomplicated: Secondary | ICD-10-CM

## 2011-06-28 DIAGNOSIS — F39 Unspecified mood [affective] disorder: Secondary | ICD-10-CM

## 2011-06-28 DIAGNOSIS — F3111 Bipolar disorder, current episode manic without psychotic features, mild: Secondary | ICD-10-CM

## 2011-06-28 DIAGNOSIS — F4310 Post-traumatic stress disorder, unspecified: Secondary | ICD-10-CM

## 2011-06-28 LAB — GLUCOSE, CAPILLARY
Glucose-Capillary: 164 mg/dL — ABNORMAL HIGH (ref 70–99)
Glucose-Capillary: 184 mg/dL — ABNORMAL HIGH (ref 70–99)

## 2011-06-28 MED ORDER — OLANZAPINE 5 MG PO TABS
5.0000 mg | ORAL_TABLET | Freq: Two times a day (BID) | ORAL | Status: DC
  Administered 2011-06-28 – 2011-06-30 (×4): 5 mg via ORAL
  Filled 2011-06-28 (×4): qty 1

## 2011-06-28 NOTE — Progress Notes (Signed)
[facility] MD Progress Note  06/28/2011 3:35 PM  Diagnosis:  Axis I: Bipolar, mixed, Generalized Anxiety Disorder, Post Traumatic Stress Disorder and Substance Induced Mood Disorder  ADL's:  Intact  Sleep: Fair  Appetite:  Poor  Suicidal Ideation:  Plan:  yes Intent:  yes Means:  no Homicidal Ideation:  Plan:  no Intent:  no Means:  no  AEB (as evidenced by):Patient was found resting on her bed while one to one staff is watching. She woke up to talk and stated that she has been depressed, anxious, worried about thoughts of hurting herself. She has history of trauma as a child and flash backs. She has recently kicked from her aunts home and moved to friends home in [place]. She has UDS positive for cocaine.   Mental Status Examination/Evaluation: Objective:  Appearance: Bizarre, Disheveled and Guarded  Eye Contact::  Fair  Speech:  Clear and Coherent  Volume:  Increased  Mood:  Anxious, Depressed, Hopeless, Irritable and Worthless  Affect:  Non-Congruent, Labile and Tearful  Thought Process:  Circumstantial, Irrelevant and Tangential  Orientation:  Full  Thought Content:  Rumination  Suicidal Thoughts:  Yes.  with intent/plan  Homicidal Thoughts:  No  Memory:  Immediate;   Fair  Judgement:  Impaired  Insight:  Lacking  Psychomotor Activity:  Restlessness  Concentration:  Poor  Recall:  Poor  Akathisia:  Yes  Handed:  Right  AIMS (if indicated):     Assets:  Others:  homeless seeking care including housing and finance  Sleep:  Number of Hours: 6    Vital Signs:Blood pressure 115/75, pulse 103, temperature 98.1 F (36.7 C), temperature source Oral, resp. rate 16, height 5\' 3"  (1.6 m), weight 72.122 kg (159 lb), last menstrual period 06/01/2011. Current Medications: Current Facility-Administered Medications  Medication Dose Route Frequency Provider Last Rate Last Dose  . acetaminophen (TYLENOL) tablet 650 mg  650 mg Oral Q6H PRN Alyson Kuroski-Mazzei, DO      . alum & mag  hydroxide-simeth (MAALOX/MYLANTA) 200-200-20 MG/5ML suspension 30 mL  30 mL Oral Q4H PRN Alyson Kuroski-Mazzei, DO      . ciprofloxacin (CIPRO) tablet 500 mg  500 mg Oral BID Viviann Spare, NP   500 mg at 06/28/11 0946  . insulin aspart (novoLOG) injection 0-9 Units  0-9 Units Subcutaneous TID WC Verne Spurr, PA   2 Units at 06/28/11 1152  . LORazepam (ATIVAN) tablet 1 mg  1 mg Oral Q8H Verne Spurr, [place]   1 mg at 06/28/11 1513  . magnesium hydroxide (MILK OF MAGNESIA) suspension 30 mL  30 mL Oral Daily PRN Alyson Kuroski-Mazzei, DO      . metFORMIN (GLUCOPHAGE) tablet 500 mg  500 mg Oral Q breakfast Verne Spurr, PA   500 mg at 06/28/11 0946  . mulitivitamin with minerals tablet 1 tablet  1 tablet Oral Daily Alyson Kuroski-Mazzei, DO   1 tablet at 06/28/11 0946  . nicotine (NICODERM CQ - dosed in mg/24 hours) patch 21 mg  21 mg Transdermal Q0600 Alyson Kuroski-Mazzei, DO   21 mg at 06/28/11 7829  . OLANZapine (ZYPREXA) tablet 5 mg  5 mg Oral QHS Alyson Kuroski-Mazzei, DO   5 mg at 06/27/11 2231  . QUEtiapine (SEROQUEL) tablet 100 mg  100 mg Oral BID Verne Spurr, PA   100 mg at 06/28/11 0947  . QUEtiapine (SEROQUEL) tablet 200 mg  200 mg Oral QHS Verne Spurr, PA   200 mg at 06/27/11 2231  . QUEtiapine (SEROQUEL) tablet 50  mg  50 mg Oral Q6H PRN Franchot Gallo, MD   50 mg at 06/28/11 1513  . QUEtiapine (SEROQUEL) tablet 50 mg  50 mg Oral QHS Verne Spurr, PA   50 mg at 06/27/11 2234  . thiamine (VITAMIN B-1) tablet 100 mg  100 mg Oral Daily Alyson Kuroski-Mazzei, DO   100 mg at 06/28/11 0947  . traZODone (DESYREL) tablet 50 mg  50 mg Oral QHS Verne Spurr, PA   50 mg at 06/27/11 2231    Lab Results:  Results for orders placed during the hospital encounter of 06/23/11 (from the past 48 hour(s))  GLUCOSE, CAPILLARY     Status: Abnormal   Collection Time   06/26/11  5:15 PM      Component Value Range Comment   Glucose-Capillary 100 (*) 70 - 99 (mg/dL)   GLUCOSE, CAPILLARY     Status:  Abnormal   Collection Time   06/26/11 10:00 PM      Component Value Range Comment   Glucose-Capillary 140 (*) 70 - 99 (mg/dL)   GLUCOSE, CAPILLARY     Status: Abnormal   Collection Time   06/27/11  6:13 AM      Component Value Range Comment   Glucose-Capillary 110 (*) 70 - 99 (mg/dL)    Comment 1 Notify RN     GLUCOSE, CAPILLARY     Status: Normal   Collection Time   06/27/11 12:10 PM      Component Value Range Comment   Glucose-Capillary 87  70 - 99 (mg/dL)    Comment 1 Notify RN     GLUCOSE, CAPILLARY     Status: Abnormal   Collection Time   06/27/11  5:44 PM      Component Value Range Comment   Glucose-Capillary 117 (*) 70 - 99 (mg/dL)   GLUCOSE, CAPILLARY     Status: Abnormal   Collection Time   06/27/11  8:11 PM      Component Value Range Comment   Glucose-Capillary 154 (*) 70 - 99 (mg/dL)    Comment 1 Notify RN     GLUCOSE, CAPILLARY     Status: Abnormal   Collection Time   06/27/11 10:15 PM      Component Value Range Comment   Glucose-Capillary 119 (*) 70 - 99 (mg/dL)   GLUCOSE, CAPILLARY     Status: Abnormal   Collection Time   06/28/11  6:04 AM      Component Value Range Comment   Glucose-Capillary 104 (*) 70 - 99 (mg/dL)    Comment 1 Notify RN     GLUCOSE, CAPILLARY     Status: Abnormal   Collection Time   06/28/11 11:47 AM      Component Value Range Comment   Glucose-Capillary 164 (*) 70 - 99 (mg/dL)   GLUCOSE, CAPILLARY     Status: Abnormal   Collection Time   06/28/11  3:03 PM      Component Value Range Comment   Glucose-Capillary 184 (*) 70 - 99 (mg/dL)     Physical Findings: AIMS:  , ,  ,  ,    CIWA:  CIWA-Ar Total: 10  COWS:     Treatment Plan Summary: Daily contact with patient to assess and evaluate symptoms and progress in treatment Medication management  Plan: Patient has been anxious and asking medication to stabilize her symptoms. She needed 1:1 observation for safety and secure therapeutic milieu.   Aino Heckert,JANARDHAHA R. 06/28/2011,  3:35 PM

## 2011-06-28 NOTE — Progress Notes (Signed)
See NSG  notes on MHT  1:1 flowsheet

## 2011-06-28 NOTE — Progress Notes (Deleted)
[facility] MD Progress Note  06/28/2011 2:25 PM  Diagnosis:  Axis I: Bipolar, Depressed, Post Traumatic Stress Disorder and Substance Abuse  ADL's:  Intact  Sleep: Fair  Appetite:  Fair  Suicidal Ideation:  Plan:  no Intent:  no Means:  nio Homicidal Ideation:  Plan:  no Intent:  no Means:  no  AEB (as evidenced by):  Mental Status Examination/Evaluation: Objective:  Appearance: Bizarre and Disheveled  Eye Contact::  Fair  Speech:  Clear and Coherent  Volume:  Normal  Mood:  Anxious and Depressed  Affect:  Blunt, Constricted, Depressed, Inappropriate and Restricted  Thought Process:  Coherent  Orientation:  Full  Thought Content:  Rumination  Suicidal Thoughts:  No  Homicidal Thoughts:  No  Memory:  Immediate;   Fair Recent;   Fair  Judgement:  Impaired  Insight:  Fair  Psychomotor Activity:  Psychomotor Retardation  Concentration:  Poor  Recall:  Fair  Akathisia:  Yes  Handed:  Right  AIMS (if indicated):     Assets:  Desire for Improvement Financial Resources/Insurance Housing Resilience Transportation Vocational/Educational  Sleep:  Number of Hours: 6    Vital Signs:Blood pressure 115/75, pulse 103, temperature 98.1 F (36.7 C), temperature source Oral, resp. rate 16, height 5\' 3"  (1.6 m), weight 72.122 kg (159 lb), last menstrual period 06/01/2011. Current Medications: Current Facility-Administered Medications  Medication Dose Route Frequency Provider Last Rate Last Dose  . acetaminophen (TYLENOL) tablet 650 mg  650 mg Oral Q6H PRN Alyson Kuroski-Mazzei, DO      . alum & mag hydroxide-simeth (MAALOX/MYLANTA) 200-200-20 MG/5ML suspension 30 mL  30 mL Oral Q4H PRN Alyson Kuroski-Mazzei, DO      . ciprofloxacin (CIPRO) tablet 500 mg  500 mg Oral BID Viviann Spare, NP   500 mg at 06/28/11 0946  . insulin aspart (novoLOG) injection 0-9 Units  0-9 Units Subcutaneous TID WC Verne Spurr, PA   2 Units at 06/28/11 1152  . LORazepam (ATIVAN) tablet 1 mg  1 mg Oral  Q8H Verne Spurr, [place]   1 mg at 06/28/11 0615  . magnesium hydroxide (MILK OF MAGNESIA) suspension 30 mL  30 mL Oral Daily PRN Alyson Kuroski-Mazzei, DO      . metFORMIN (GLUCOPHAGE) tablet 500 mg  500 mg Oral Q breakfast Verne Spurr, PA   500 mg at 06/28/11 0946  . mulitivitamin with minerals tablet 1 tablet  1 tablet Oral Daily Alyson Kuroski-Mazzei, DO   1 tablet at 06/28/11 0946  . nicotine (NICODERM CQ - dosed in mg/24 hours) patch 21 mg  21 mg Transdermal Q0600 Alyson Kuroski-Mazzei, DO   21 mg at 06/28/11 1610  . OLANZapine (ZYPREXA) tablet 5 mg  5 mg Oral QHS Alyson Kuroski-Mazzei, DO   5 mg at 06/27/11 2231  . QUEtiapine (SEROQUEL) tablet 100 mg  100 mg Oral BID Verne Spurr, PA   100 mg at 06/28/11 0947  . QUEtiapine (SEROQUEL) tablet 200 mg  200 mg Oral QHS Verne Spurr, PA   200 mg at 06/27/11 2231  . QUEtiapine (SEROQUEL) tablet 50 mg  50 mg Oral Q6H PRN Franchot Gallo, MD   50 mg at 06/27/11 1506  . QUEtiapine (SEROQUEL) tablet 50 mg  50 mg Oral QHS Verne Spurr, PA   50 mg at 06/27/11 2234  . thiamine (VITAMIN B-1) tablet 100 mg  100 mg Oral Daily Alyson Kuroski-Mazzei, DO   100 mg at 06/28/11 0947  . traZODone (DESYREL) tablet 50 mg  50 mg Oral  QHS Verne Spurr, PA   50 mg at 06/27/11 2231  . DISCONTD: LORazepam (ATIVAN) tablet 2 mg  2 mg Oral Q6H PRN Alyson Kuroski-Mazzei, DO   2 mg at 06/27/11 9562    Lab Results:  Results for orders placed during the hospital encounter of 06/23/11 (from the past 48 hour(s))  GLUCOSE, CAPILLARY     Status: Abnormal   Collection Time   06/26/11  5:15 PM      Component Value Range Comment   Glucose-Capillary 100 (*) 70 - 99 (mg/dL)   GLUCOSE, CAPILLARY     Status: Abnormal   Collection Time   06/26/11 10:00 PM      Component Value Range Comment   Glucose-Capillary 140 (*) 70 - 99 (mg/dL)   GLUCOSE, CAPILLARY     Status: Abnormal   Collection Time   06/27/11  6:13 AM      Component Value Range Comment   Glucose-Capillary 110 (*) 70 -  99 (mg/dL)    Comment 1 Notify RN     GLUCOSE, CAPILLARY     Status: Normal   Collection Time   06/27/11 12:10 PM      Component Value Range Comment   Glucose-Capillary 87  70 - 99 (mg/dL)    Comment 1 Notify RN     GLUCOSE, CAPILLARY     Status: Abnormal   Collection Time   06/27/11  5:44 PM      Component Value Range Comment   Glucose-Capillary 117 (*) 70 - 99 (mg/dL)   GLUCOSE, CAPILLARY     Status: Abnormal   Collection Time   06/27/11  8:11 PM      Component Value Range Comment   Glucose-Capillary 154 (*) 70 - 99 (mg/dL)    Comment 1 Notify RN     GLUCOSE, CAPILLARY     Status: Abnormal   Collection Time   06/27/11 10:15 PM      Component Value Range Comment   Glucose-Capillary 119 (*) 70 - 99 (mg/dL)   GLUCOSE, CAPILLARY     Status: Abnormal   Collection Time   06/28/11  6:04 AM      Component Value Range Comment   Glucose-Capillary 104 (*) 70 - 99 (mg/dL)    Comment 1 Notify RN     GLUCOSE, CAPILLARY     Status: Abnormal   Collection Time   06/28/11 11:47 AM      Component Value Range Comment   Glucose-Capillary 164 (*) 70 - 99 (mg/dL)     Physical Findings: AIMS:  , ,  ,  ,    CIWA:  CIWA-Ar Total: 10  COWS:     Treatment Plan Summary: Daily contact with patient to assess and evaluate symptoms and progress in treatment Medication management  Plan: Continue level of observation 1:1 for safety Continue treatment plan and no medications changed. Kathleen [name],Kathleen R. 06/28/2011, 2:25 PM

## 2011-06-28 NOTE — Progress Notes (Signed)
06/28/2011 Nursing 1:1  1000  D Pt requests to go into 300 hall clothes closet.." I need some pants. I need something to wear. Can you get me some pants?". Pt is observed standing in the hall...breathing labored. Half her hair is combed and the other half is standing straight out. She has poor dental hygiene. She is disheveled and dirty, with crumbs of uneaten food dribbled down the front of her dirty and stained t shirt. She is tearful, sniffling frequently, as she looks through the mound of donated clothes in the closet. After several minutes, she chooses 2 pair of pants and one shirt and thanks this nurse. Her affect is sad, depressed and she is actively grieving the loss of aunt, which she just learned of yesterday morning, while here at [facility]. She makes poor eye contact. When she does speak, it is loud, can be intrusive, and she has difficulty expressing her thoughts. A When this nurse asked her how she feels today ?Marland Kitchen..does she feel suicidal ? She shakes her head ( as in " no"), from side - to - side and says " I don't know.Marland KitchenMarland KitchenMarland KitchenI just don't know what I'm going to do today..." R Safety is maitnaeind and POC includes maintaining pt on 1:`1 per MD order in POC and keeping pt safe. Will cont to support and foster therapeutic relationship already established PD  RN [facility]

## 2011-06-28 NOTE — Progress Notes (Signed)
06/28/2011 Nursing 1:1  D Pt is seen standing at the med window ...leaning up against the window with her arm on the window and her head down on her arm...she is moaning and crying softly..." please give me something....PLEASE!!!! You've got to call the doctor and give me more medicine..." She is agitated. She is unconsolable, crying and saying " you don't know what its like to be damaged...". She takes her meds sceduled at this time after much consoling from this nurse. She is allowed to walk with her 1:1 escort ( in front of the other patients) to the cafeteria, where she goes in the front of the line and picks out what her dinner is, then returns to the unit to eat. A She complained of chest pain about 1 hour ago, vs were WNL and PA was notified and EKG was done and transmitted....sinus tachycardia with HR 103.  A Pt has not attended her groups today. Has no insight into her problems and demands and wants immediate relief when she experiences pain, either physical and / or emotional .  R Safety is maintaeidn and POC includes fostering therapeutic relationship already established PD RN [facility]

## 2011-06-28 NOTE — Progress Notes (Signed)
[facility] Group Notes:  (Counselor/Nursing/MHT/Case Management/Adjunct)  06/28/2011 3:39 PM  Type of Therapy:  Group Therapy  Participation Level:  Did Not Attend  Vanetta Mulders, LPCA    Purcell Nails 06/28/2011, 3:39 PM

## 2011-06-28 NOTE — Progress Notes (Signed)
06/28/2011  Nursing 1:1  1350  D Pt is sleeping at this time...she has been up ( after showering and dressing, fixing her hair and applying  her makeup ) she has been interacting with staff and the other pts appropriately. She remains tearful...talking about her aunt's death...needing to process and to be heard about the terrible pain that she feels. A She sleeps  Intermittently and when she awakens she immediately requests drugs. R Safety is maintaiend and POC includes  1:1 to help pt stay safe. PD RN [facility]

## 2011-06-29 LAB — GLUCOSE, CAPILLARY: Glucose-Capillary: 142 mg/dL — ABNORMAL HIGH (ref 70–99)

## 2011-06-29 MED ORDER — QUETIAPINE FUMARATE 100 MG PO TABS
100.0000 mg | ORAL_TABLET | Freq: Every morning | ORAL | Status: DC
  Administered 2011-06-29 – 2011-06-30 (×2): 100 mg via ORAL
  Filled 2011-06-29: qty 1

## 2011-06-29 MED ORDER — QUETIAPINE FUMARATE 300 MG PO TABS
300.0000 mg | ORAL_TABLET | Freq: Every day | ORAL | Status: DC
  Administered 2011-06-29 – 2011-06-30 (×2): 300 mg via ORAL
  Filled 2011-06-29 (×3): qty 1

## 2011-06-29 NOTE — Progress Notes (Signed)
Pt's pulse very high at time of VS earlier in shift when Pt upset by the time this writer got to her room Pt was sleeping.  Pt did have EKG done earlier on 23rd due to tachycardia.  Pt did not have any complaints during shift, and did receive Ativan as ordered as well as ordered Seroquel 200 mg and Trazodone 50 mg at that time.  Pt then went to bed and went to sleep.  No acute distress noted.   Pt has order for VS every four hours, did confirm that this is while awake.  Will continue to monitor.

## 2011-06-29 NOTE — Progress Notes (Signed)
06/29/2011  Nursing 1:1 D Pt is seen standing at group room doorway.Marland Kitchenadressingn pts sitting in room. She takes her right hand, clenched in a fist and yells " Attention everyone...who stole my slippers? Who took them and I want them back now!!!". This nurse sat with pt in her room and processed. She demonstrates little insight into developing healthier coping mechanisms. She acknwoledges that she is in pain...that she has suffered many atrocities and traumas throughout her life...that she goes to drugs to deaden her feelings. She requests to be off her 1:1. In the same minute, she sobs" I am so sad..ibuprofen wake up every day and I am very suicidal. I want to die every day". She agrees that her pain feels intolerable. She does not want to process how to develop coping mechanisms. She wants drugs and she wants relief from her pain....NOW, A She takes her AM meds per ordered. R Safety is maintaiend and POC includes maintaining pt on 1:1 per MD order PD RN [facility]

## 2011-06-29 NOTE — Progress Notes (Signed)
See 1:1 flowsheet 

## 2011-06-29 NOTE — Progress Notes (Addendum)
Patient ID: Kathleen [name], female   DOB: January 20, 1966, 46 y.o.   MRN: [id number]  Diagnosis:   PTSD (post-traumatic stress disorder)   Homeless   Cocaine abuse with cocaine-induced psychotic disorder   Cocaine abuse   Bipolar 1 disorder, manic, mild    Subjective: Prudence continues to be labile and demanding.  She is tearful one minute and cooperative and pleasant the next.  Today she is asking for Ambien again, or "something to Knock me out." She states she needs the strongest medication we have and that what she has isn't working.   Vital Signs:Blood pressure 130/78, pulse 121, temperature 97 F (36.1 C), temperature source Oral, resp. rate 24, height 5\' 3"  (1.6 m), weight 72.122 kg (159 lb), last menstrual period 06/01/2011.  Objective: [name] is wearing dirty clothes today, no makeup and looks disheveled. She is goal directed in her speech and is advocating for more medication. Her mood continues to be labile and her behavior is erratic.  She can be quite volatile and escalate quickly.  Insight is poor and judgement is poor.   Sleep:  Number of Hours: 6.25  Appetite: "ok"  Lab Results: None pending.   Medications Scheduled:     . ciprofloxacin  500 mg Oral BID  . insulin aspart  0-9 Units Subcutaneous TID WC  . LORazepam  1 mg Oral Q8H  . metFORMIN  500 mg Oral Q breakfast  . mulitivitamin with minerals  1 tablet Oral Daily  . nicotine  21 mg Transdermal Q0600  . OLANZapine  5 mg Oral BID  . QUEtiapine  100 mg Oral BID  . QUEtiapine  200 mg Oral QHS  . thiamine  100 mg Oral Daily  . traZODone  50 mg Oral QHS  . DISCONTD: OLANZapine  5 mg Oral QHS  PRN Meds acetaminophen, alum & mag hydroxide-simeth, magnesium hydroxide, QUEtiapine  Assessment/Plan: Discontinuing one of the antipsychotics would be optimal, but her behavior would indicate to decrease the seroquel to a lower dose slowly, or to give the majority of it at hs. For this reason will stop the 100mg  BID, and change  to 100 AM, and 300hs. To see if this helps. Her zyprexa was increased to 5mg  BID as she stated it worked much better, but she is not where she needs to be yet in terms of thought process and behaviors. Will make those changes as stated. Due to her volatility 1:1 will be continued.  [name]. [name] [facility] 06/29/2011 10:46 AM

## 2011-06-29 NOTE — Progress Notes (Signed)
06/29/2011  Nursing 1:1 Kathleen [name] is lying down...resting after eating her dinner and taking her 1400 dose of ativan 1 mg late, due to sleeping VERY soundly . Her affect and mood remain quite labile. She escalates very quickly, especially  If she doesn't get what she's asking for IMMEDIATELY. She  Is seen standing at the med window...taking her medications, wearing a t shirt that has crumbs and  Half-eaten dinner on it. Her eyes are half-closed and  She closes them as she stands to speak with this nurse. She says ,over and over, " I just want to die..I'm just so sad.."  A She is medicated per MD order. R Safety is maintaiend as 1:1 stays in place. PD RN [facility]

## 2011-06-29 NOTE — Progress Notes (Addendum)
06/29/2011 Nursing 1:1  1430 for 1400  D Adea has been sleeping and got up around 1415. SHe presented to the nurses' station and requested to speak with this nurse. She is lethargic, she requires assistance to walk back to her bedroom, weaving from side to side on the way...she verbalizes that she is " unstable" on her feet and that she doesn't want  " to fall".Marland KitchenMarland KitchenShe shares her feelings of deep sorrow, sobbing and weeping as she  Talks...stating " I am very suicidal..I need to be here". A  She states the extra dose of zyprexa she received has " helped me a lot.I like it". R Safety is maintained and POC includes  Fostering therapeutic relationship and keeping pt safe. PD RN [facility]

## 2011-06-30 LAB — GLUCOSE, CAPILLARY
Glucose-Capillary: 123 mg/dL — ABNORMAL HIGH (ref 70–99)
Glucose-Capillary: 137 mg/dL — ABNORMAL HIGH (ref 70–99)
Glucose-Capillary: 145 mg/dL — ABNORMAL HIGH (ref 70–99)

## 2011-06-30 MED ORDER — OLANZAPINE 2.5 MG PO TABS
2.5000 mg | ORAL_TABLET | Freq: Two times a day (BID) | ORAL | Status: DC
  Administered 2011-06-30 – 2011-07-03 (×6): 2.5 mg via ORAL
  Filled 2011-06-30 (×10): qty 1

## 2011-06-30 MED ORDER — CHLORDIAZEPOXIDE HCL 25 MG PO CAPS
25.0000 mg | ORAL_CAPSULE | Freq: Once | ORAL | Status: AC
  Administered 2011-06-30: 25 mg via ORAL
  Filled 2011-06-30: qty 1

## 2011-06-30 MED ORDER — CITALOPRAM HYDROBROMIDE 20 MG PO TABS
20.0000 mg | ORAL_TABLET | Freq: Every day | ORAL | Status: DC
  Administered 2011-06-30 – 2011-07-03 (×4): 20 mg via ORAL
  Filled 2011-06-30 (×6): qty 1

## 2011-06-30 MED ORDER — LORAZEPAM 1 MG PO TABS
1.0000 mg | ORAL_TABLET | Freq: Two times a day (BID) | ORAL | Status: DC
  Administered 2011-07-01: 1 mg via ORAL
  Filled 2011-06-30 (×2): qty 1

## 2011-06-30 MED ORDER — QUETIAPINE FUMARATE 100 MG PO TABS
100.0000 mg | ORAL_TABLET | Freq: Two times a day (BID) | ORAL | Status: DC
  Administered 2011-06-30 – 2011-07-02 (×4): 100 mg via ORAL
  Filled 2011-06-30 (×7): qty 1

## 2011-06-30 NOTE — Progress Notes (Signed)
Patient ID: Kathleen [name], female   DOB: 1965-06-28, 46 y.o.   MRN: [id number] Pt denies SI/HI/AVH but is confused at times.  One-to-one sitter remains for safety reasons and to prevent falls.  Pt has been compliant with her medications and was in the group room this am but went back to bed to sleep afterwards.  Kathleen [name] denies pain and voices no complaints.

## 2011-06-30 NOTE — Progress Notes (Signed)
Pt attended discharge planning group and minimally participated.  Pt states that she didn't feel like talking in group today.  SW met with pt individually at this time.  Pt presents with flat affect and depressed mood.  Pt continues to endorse SI, stating if she was d/c she would kill herself to be with her aunt.  Pt states that she still can't believe her aunt is gone.  SW explained to pt that SW made a referral to [facility] today.  Pt states that she has been there 4-5 times in the past, with the last time being in 2000.  Pt states that they were able to help her.  SW will continue to monitor appropriate referrals.  Safety planning and suicide prevention discussed.    Kathleen [name], [place] 06/30/2011  2:37 PM

## 2011-06-30 NOTE — Progress Notes (Signed)
Pt denies SI/HI/AVH.  She wants to go home because she does not like the medications that were ordered for her.  Kathleen [name] was placed on the [facility] wait list for long term rehabilitation.  She denies physical pain and has slept most of the day except for meals and a few phone calls.  After talking with her MD and she is suppose to program on the 500 hall to motivate her to participate in groups, a grief and loss session is scheduled for tomorrow that she could benefit from.

## 2011-06-30 NOTE — Progress Notes (Signed)
[facility] Group Notes:  (Counselor/Nursing/MHT/Case Management/Adjunct)  06/30/2011 6:40 PM  Type of Therapy:  Group Therapy at 11:00AM and 1:15 PM  Participation Level:  Did Not Attend either session   Clide Dales 06/30/2011, 6:40 PM

## 2011-06-30 NOTE — Progress Notes (Signed)
[facility] MD Progress Note  06/30/2011 2:12 PM  S/O: Pt seen and evaluated, chart reviewed.  Reviewed short term and long term goals, medications, current treatment and acute/chronic safety. Pt still significantly depressed and still unable to contract for safety.  Sig mood lability, c/o AH when depressed in past and difficulty sleeping.  Continued SI, yet crying spells and agitation slightly improved. Sig anxiety noted and pt not wanting to titrate off benzodiazepines.  Discussed at length difficulty of using medications with addictive properties in light of her SA issues.  Staff concerned about malingering in light of trying to get disability at this time.    Sleep:  Number of Hours: 5.5    Vital Signs:Blood pressure 106/63, pulse 121, temperature 97.8 F (36.6 C), temperature source Oral, resp. rate 18, height 5\' 3"  (1.6 m), weight 72.122 kg (159 lb), last menstrual period 06/01/2011, SpO2 96.00%.  Repeat vitals: sitting 126/77 HR 112; standing 139/86 HR 113.  Physical Findings: CIWA:  CIWA-Ar Total: 0   A/P: Cocaine Dependence; Benzodiazepine W/D; PTSD; DM; Mood Disorder NOS; r/o BPD; r/o Malingering (malingering intensity and chronicity of valid s/s)  Treatment goals and medication management reviewed with patient.  Continue current treatment plan and medications with the following changes:  -Decreased Ativan from q8hrs to q12hrs.  -Decreased olanzapine, while increasing Seroquel (titration in process). Pt ambivalent about which one has been more helpful, will further discuss with pt and limit polypharmacy. -Initiated citalopram for PTSD and anxiety s/s -Wrote for one time librium dose in light of benzo w/d and continued tachycardia.  Will follow.  Considering librium taper if necessary.  No SEs reported at current dosages.  Cont 1:1 for safety and continued SI. Pt agreeable with plan, but still requesting prn benzos.  Further collateral and medication adjustments pending.  Discussed with  team.  Meds:    . ciprofloxacin  500 mg Oral BID  . citalopram  20 mg Oral Daily  . insulin aspart  0-9 Units Subcutaneous TID WC  . LORazepam  1 mg Oral Q12H  . metFORMIN  500 mg Oral Q breakfast  . mulitivitamin with minerals  1 tablet Oral Daily  . nicotine  21 mg Transdermal Q0600  . OLANZapine  2.5 mg Oral BID  . QUEtiapine  100 mg Oral BID  . QUEtiapine  300 mg Oral QHS  . thiamine  100 mg Oral Daily  . traZODone  50 mg Oral QHS  . DISCONTD: LORazepam  1 mg Oral Q8H  . DISCONTD: OLANZapine  5 mg Oral BID  . DISCONTD: QUEtiapine  100 mg Oral q morning - 10a    Kuroski-Mazzei, Carlis Blanchard 06/30/2011, 2:12 PM

## 2011-06-30 NOTE — Progress Notes (Signed)
Met with patient in her room at her request.  Patient states she would like to attend groups but no one ever wakes her up. Counselor agreed to wake her tomorrow at 11 and 1:15 for group.  Further conversation directed at patient's rumination of painful memories, patient willing to try redirection of thoughts after allowing herself to think of something for 10 to 15 minutes.  Patient given journal to record her process.  Clide Dales 06/30/2011 6:40 PM

## 2011-06-30 NOTE — Tx Team (Signed)
Interdisciplinary Treatment Plan Update (Adult)  Date:  06/30/2011  Time Reviewed:  10:18 AM   Progress in Treatment: Attending groups: Yes Participating in groups:  Yes Taking medication as prescribed: Yes Tolerating medication:  Yes Family/Significant othe contact made:  Yes Patient understands diagnosis:  Yes Discussing patient identified problems/goals with staff:  Yes Medical problems stabilized or resolved:  Yes Denies suicidal/homicidal ideation: No Issues/concerns per patient self-inventory:  None identified Other: N/A  New problem(s) identified: None Identified  Reason for Continuation of Hospitalization: Anxiety Depression Medication stabilization Suicidal ideation  Interventions implemented related to continuation of hospitalization: mood stabilization, medication monitoring and adjustment, group therapy and psycho education, safety checks q 15 mins  Additional comments: N/A  Estimated length of stay: unknown  Discharge Plan: Pt continues to be severely depressed and unable to discuss a d/c plan.  SW will refer pt to [facility] today.   New goal(s): N/A  Review of initial/current patient goals per problem list:    1.  Goal(s): Address substance use  Met:  No  Target date: by discharge  As evidenced by: completing detox protocol and refer to appropriate treatment  2.  Goal (s): Reduce depressive and anxiety symptoms  Met:  No  Target date: by discharge  As evidenced by: Reducing depression from a 10 to a 3 as reported by pt.    3.  Goal(s): Eliminate SI  Met:  No  Target date: by discharge  As evidenced by: Pt denying SI.    Attendees: Patient:     Family:     Physician:  Lupe Carney, DO 06/30/2011 10:18 AM   Nursing: Carolynn Comment, RN 06/30/2011 10:18 AM   Case Manager:  Reyes Ivan, LCSWA 06/30/2011  10:18 AM   Counselor:  Ronda Fairly, LCSWA 06/30/2011  10:18 AM   Other:  Richelle Ito, LCSW 06/30/2011 10:18  AM   Other:  Magdalen Spatz, counseling intern 06/30/2011 10:20 AM   Other:     Other:      Scribe for Treatment Team:   Reyes Ivan 06/30/2011 10:18 AM

## 2011-07-01 LAB — GLUCOSE, CAPILLARY
Glucose-Capillary: 100 mg/dL — ABNORMAL HIGH (ref 70–99)
Glucose-Capillary: 100 mg/dL — ABNORMAL HIGH (ref 70–99)

## 2011-07-01 MED ORDER — HYDROXYZINE HCL 50 MG PO TABS
50.0000 mg | ORAL_TABLET | Freq: Four times a day (QID) | ORAL | Status: DC | PRN
  Administered 2011-07-01 – 2011-07-03 (×3): 50 mg via ORAL

## 2011-07-01 MED ORDER — QUETIAPINE FUMARATE 200 MG PO TABS
200.0000 mg | ORAL_TABLET | Freq: Every day | ORAL | Status: DC
  Administered 2011-07-01: 200 mg via ORAL
  Filled 2011-07-01 (×2): qty 1

## 2011-07-01 MED ORDER — CHLORDIAZEPOXIDE HCL 25 MG PO CAPS
25.0000 mg | ORAL_CAPSULE | ORAL | Status: DC
  Filled 2011-07-01: qty 1

## 2011-07-01 MED ORDER — CHLORDIAZEPOXIDE HCL 25 MG PO CAPS: 25.0000 mg | ORAL_CAPSULE | Freq: Every day | ORAL | Status: DC

## 2011-07-01 MED ORDER — CHLORDIAZEPOXIDE HCL 25 MG PO CAPS
25.0000 mg | ORAL_CAPSULE | Freq: Four times a day (QID) | ORAL | Status: AC
  Administered 2011-07-01 (×3): 25 mg via ORAL
  Filled 2011-07-01 (×3): qty 1

## 2011-07-01 MED ORDER — CHLORDIAZEPOXIDE HCL 25 MG PO CAPS
25.0000 mg | ORAL_CAPSULE | Freq: Three times a day (TID) | ORAL | Status: AC
  Administered 2011-07-02: 25 mg via ORAL
  Filled 2011-07-01 (×2): qty 1

## 2011-07-01 NOTE — Progress Notes (Signed)
Pt did not attend d/c planning group on this date.  SW met with pt individually at this time.  Pt presents with flat affect and depressed mood.  Pt was excited to tell SW she was now off of 1:1.  SW asked pt what changed.  Pt explained that she prayed and thought about the fact that if God got her this far, she should still be here.  Pt states that she thought that she should stop feeling sorry for herself and expecting pity from others.  Pt states that she wants to continue treatment after d/c from here but doesn't want to go to the state hospital.  SW discussed substance abuse treatment options, as there aren't any long term treatment options for mental health.  Pt was open to getting further treatment for her cocaine dependence.  SW discussed [facility] as her best option and pt agreed to this referral.  Pt states that she wants to plan to d/c on Saturday, if she is still feeling well then.  Pt states that she can live with an aunt in [place] until she goes to [facility].  Pt states that she is able to ride the bus for transportation.  SW referred pt to [facility] and pt is able to go on 3/12.  SW also referred pt to [facility] the [place] for medication management and therapy for follow up until she goes for treatment.  Pt denies SI today.  Pt ranks depression and anxiety at an 8-9 today.  SW notes that pt appears more hopeful during this interaction.    Reyes Ivan, LCSWA 07/01/2011  1:45 PM

## 2011-07-01 NOTE — Progress Notes (Signed)
1000 1:1 NOTE. Patient has been in bed sleeping all morning, did get up to take her medications, did not speak or utter a word about anything this morning, when receiving her meds she kept her eyes closed and sat down while meds were prepared for her, is being treated for UTI and diabetes w/CBG followup, taking meds as ordered by MD, eats meals in her room, according to report is still +SI and depressed d/t aunts death and other personal issues she has, stated to nurse last nite that if she is discharged she will find a way to "kill myself". No attending group. q20min safety checks continue and support offered Safety maintained and 1:1 continues

## 2011-07-01 NOTE — Progress Notes (Signed)
07/01/2011         Time: 1415      Group Topic/Focus: The focus of this group is on discussing various styles of communication and communicating assertively using 'I' (feeling) statements.   Participation Level: Minimal  Participation Quality: Drowsy  Affect: Blunted  Cognitive: Oriented   Additional Comments: Patient participated briefly before falling asleep and staying that way for the remainder of group.  Delonda Coley 07/01/2011 3:44 PM

## 2011-07-01 NOTE — Progress Notes (Signed)
[facility] MD Progress Note  07/01/2011 12:22 PM  S/O: Pt seen and evaluated, chart reviewed.  Reviewed short term and long term goals, medications, current treatment and acute/chronic safety. Patient stated that her mood was "better". Her affect was mood congruent and happier. She denied any current thoughts of self injurious behavior, suicidal ideation or homicidal ideation. There were no auditory or visual hallucinations, paranoia, delusional thought processes, or mania noted.  Thought process was linear and goal directed.  No psychomotor agitation or retardation was noted. Speech was normal rate, tone and volume. Eye contact was good. Judgment and insight are limited.  Patient went to programming on [address] and agreed to get out of bed and eat in the cafeteria.     Sleep:  Number of Hours: 6.5    Vital Signs:Blood pressure 112/73, pulse 112, temperature 97.4 F (36.3 C), temperature source Oral, resp. rate 24, height 5\' 3"  (1.6 m), weight 72.122 kg (159 lb), last menstrual period 06/01/2011, SpO2 99.00%.  A/P: Cocaine Dependence; Benzodiazepine W/D; PTSD; DM; Mood Disorder NOS; r/o BPD; r/o Malingering (malingering intensity and chronicity of valid s/s)  Treatment goals and medication management reviewed with patient.  Continue current treatment plan and medications with the following changes:  -Librium taper to better protect from Ativan/Xanax w/d. EKG, as well, in light of continued tachycardia.  -Decreased Seroquel (titration in process). Will further discuss with pt and limit polypharmacy. -Initiated citalopram for PTSD and anxiety s/s yesterday, pt tolerating it well.  No SEs reported at current dosages.  Can discontinue 1:1 and continue q15 minute checks per unit protocol.  No clinical indication for one on one level of observation at this time.  Pt contracting for safety.  Mental health treatment, medication management and continued sobriety will mitigate against the increased risk of harm to  self and/or others.  Discussed the importance of recovery with pt, as well as, tools to move forward in a healthy & safe manner.  Pt agreeable with the plan.  Discussed with the team.   Lupe Carney 07/01/2011, 12:22 PM

## 2011-07-01 NOTE — Progress Notes (Signed)
Pt remains on 1:1 for unpredictable behaviors and inability to contract for safety.  Pt has been asking for "breathing treatments" and says she had one yesterday.  There are no orders for breathing treatments, and pt's O2 was at 99% when assessed.  MD is aware of pt's requests.  Pt has been asking the MHTs, but she did not ask this Clinical research associate about one.  Pt continues labile/irritable.  Meds given as ordered.  CBGs have been mostly running in the low 100s.   Pt is taking Cipro for a UTI.  Pt is no longer exhibiting withdrawal symptoms.  Safety maintained with 1:1 observation.

## 2011-07-02 ENCOUNTER — Other Ambulatory Visit: Payer: Self-pay

## 2011-07-02 ENCOUNTER — Encounter (HOSPITAL_COMMUNITY): Payer: Self-pay | Admitting: Emergency Medicine

## 2011-07-02 LAB — GLUCOSE, CAPILLARY: Glucose-Capillary: 78 mg/dL (ref 70–99)

## 2011-07-02 MED ORDER — QUETIAPINE FUMARATE 100 MG PO TABS
100.0000 mg | ORAL_TABLET | Freq: Three times a day (TID) | ORAL | Status: DC
  Administered 2011-07-03: 100 mg via ORAL
  Filled 2011-07-02 (×8): qty 1

## 2011-07-02 MED ORDER — CARBAMIDE PEROXIDE 6.5 % OT SOLN
5.0000 [drp] | Freq: Two times a day (BID) | OTIC | Status: DC
  Administered 2011-07-03: 5 [drp] via OTIC
  Filled 2011-07-02 (×2): qty 15

## 2011-07-02 NOTE — Progress Notes (Signed)
Pt is in the dayroom watching TV.  She came off the 1:1 this morning.  She denies SI/HI at this time, and can contract for safety.  She reports he day has been good and hopes she can be discharged tomorrow.  She reports a little anxiety, but no withdrawal symptoms.  She voices no needs/concerns at this time.  Safety maintained with q15 minute checks.

## 2011-07-02 NOTE — Progress Notes (Signed)
Pt attended discharge planning group and actively participated.  Pt presents with bright affect and mood.  Pt denies depression and anxiety symptoms.  Pt denies SI.  Pt reports feeling stable to d/c today.  Pt states that she has arranged to live with her aunt in [place] and they are going to [place] today; pt states she wants to go with them today.  Pt states that she has been using crack for 25 years and is going to die if she uses again.  Pt states that her daughter also said she wont see pt or let pt see her granddaughter if pt uses again.  Pt states that this motivates her to stay clean and well.  Pt is excited about going to [facility] and expressed knowledge of how to get there on 2/12.  Pt is also referred to [facility] the [place] for medication management and therapy.  SW notes concern that pt was just taken off a 1:1 and is suddenly doing better today and wanting to d/c today.  Safety planning and suicide prevention discussed.     Reyes Ivan, [place] 07/02/2011  9:47 AM

## 2011-07-02 NOTE — Progress Notes (Addendum)
[facility] Group Notes:  (Counselor/Nursing/MHT/Case Management/Adjunct)  07/02/2011 9:21 AM  Type of Therapy:  Group Therapy at 11:00  Participation Level:  Did Not Attend    Clide Dales 07/02/2011, 9:21 AM  [facility] Group Notes:  (Counselor/Nursing/MHT/Case Management/Adjunct)  07/02/2011 4:52 PM  Type of Therapy:  Group Therapy  Participation Level:  Minimal  Participation Quality:  Sharing  Affect:  Blunted  Cognitive:  Oriented  Insight:  Limited  Engagement in Group:  Limited  Engagement in Therapy:  None  Modes of Intervention:  Clarification, Socialization and Support  Summary of Progress/Problems:  Patient was welcomed to the group as she has not attended until now. Patient shared that her thinking is on a positive plane the majority of the time and she is ready to discharge.Patient left group room following her "anouncement" (It is noted that patient report to counselor outside of group discussion has not been positive in nature, patient usually speaks of her self as a victim.)      Clide Dales 07/02/2011, 4:52 PM

## 2011-07-02 NOTE — Progress Notes (Signed)
[facility] Adult Inpatient Family/Significant Other Suicide Prevention Education  Suicide Prevention Education:  Education Completed  w Mother, Ms. Brandt Loosen [phone]  has been identified by the patient as the family member/significant other with whom the patient will be residing, and identified as the person(s) who will aid the patient in the event of a mental health crisis (suicidal ideations/suicide attempt).  With written consent from the patient, the family member/significant other has been provided the following suicide prevention education, prior to the and/or following the discharge of the patient.  The suicide prevention education provided includes the following:  Suicide risk factors  Suicide prevention and interventions  National Suicide Hotline telephone number  [facility] assessment telephone number  [facility] Emergency Assistance 911  [facility] and/or Residential Mobile Crisis Unit telephone number  Request made of family/significant other to:  Remove weapons (e.g., guns, rifles, knives), all items previously/currently identified as safety concern.    Remove drugs/medications (over-the-counter, prescriptions, illicit drugs), all items previously/currently identified as a safety concern.  The family member/significant other verbalizes understanding of the suicide prevention education information provided.  The family member/significant other agrees to remove the items of safety concern listed above.  Clide Dales 07/02/2011, 4:48 PM

## 2011-07-02 NOTE — ED Notes (Signed)
Pt from [facility], states she has had SOB for months but it has gotten worse. Pt states she received 1 breathing tx while admitted to [facility] which helped her a lot but they haven't given her any since.

## 2011-07-02 NOTE — Progress Notes (Signed)
Pt is pleasant. Pt has slept most of the day. Pt has not attended groups. Pt woke up and felt short of breath. Pt was seen by NP and is being sent to the ER. Pt was offered support and encouragement. Pt safety maintained on unit.

## 2011-07-02 NOTE — Progress Notes (Addendum)
Patient ID: Kathleen [name], female   DOB: [birth year], 46 y.o.   MRN: [id number] Kathleen [name] has asked to see me today because of ear pain and being short of breath. Was unable to sleep last night lying flat and tried to sleep in an upright position. She is admitted for Bipolar Disorder and cocaine abuse.  She was using cocaine heavily  (smoking rock) for about three weeks and was having concurrent episodes of chest pain while using.  Last episode of chest pain was mild-substernal chest pain yesterday without radiation.  She complains of feeling short of breath, can't get her breath, and is 'wheezing' at times.  Also complains of R ear pain and sounds being muffled.   She denies suicidal thoughts today.  Complains of significant problems sleeping and I have advised her that we will not increase sedating medications at night, until we have a better idea of what is causing her shortness of breath.    PMH: negative for myocardial infarction, seizures or asthma. Positive for diabetes controlled with oral agents.  PE: Adequately nourished and hydrated female in mild distress from walking up the hallway. Afebrile. OP non-injected; Both nares swollen and red with some yellow discharge. EAC's are blocked by cerumin bilaterally, and ac nodes are palpable. Respiratory rate 36 with ambulation 50' and pulse ox 97%. Lung sounds are difficult to hear at the bases. No wheezes heard but some scattered moist rales. No cough in my presence.  Apical pulse is 103/min with no extra sounds.    A: She does appear short-of breath and reports repeated episodes of chest pain in the setting of recent cocaine abuse, so will ask ED to evaluate and r/o cardiac issue vs. Bronchitis. I am unable to get stat labs or a chest x-ray in this hospital.    Will give her some Debrox for now for her ears, and await diagnostic testing.

## 2011-07-03 ENCOUNTER — Inpatient Hospital Stay (HOSPITAL_COMMUNITY): Payer: PRIVATE HEALTH INSURANCE

## 2011-07-03 LAB — GLUCOSE, CAPILLARY: Glucose-Capillary: 125 mg/dL — ABNORMAL HIGH (ref 70–99)

## 2011-07-03 MED ORDER — ALBUTEROL SULFATE HFA 108 (90 BASE) MCG/ACT IN AERS
2.0000 | INHALATION_SPRAY | RESPIRATORY_TRACT | Status: DC | PRN
  Administered 2011-07-03 (×2): 2 via RESPIRATORY_TRACT
  Filled 2011-07-03: qty 6.7

## 2011-07-03 MED ORDER — CITALOPRAM HYDROBROMIDE 20 MG PO TABS: 20.0000 mg | ORAL_TABLET | Freq: Every day | ORAL | Status: DC

## 2011-07-03 MED ORDER — CARBAMIDE PEROXIDE 6.5 % OT SOLN: 5.0000 [drp] | Freq: Two times a day (BID) | OTIC | Status: AC

## 2011-07-03 MED ORDER — TRAZODONE HCL 50 MG PO TABS: 50.0000 mg | ORAL_TABLET | Freq: Every day | ORAL | Status: DC

## 2011-07-03 MED ORDER — OLANZAPINE 2.5 MG PO TABS: ORAL_TABLET | ORAL | Status: DC

## 2011-07-03 MED ORDER — INSULIN GLARGINE 100 UNIT/ML ~~LOC~~ SOLN: 1.0000 [IU] | Freq: Every day | SUBCUTANEOUS | Status: DC

## 2011-07-03 MED ORDER — ALBUTEROL SULFATE (5 MG/ML) 0.5% IN NEBU
5.0000 mg | INHALATION_SOLUTION | Freq: Once | RESPIRATORY_TRACT | Status: AC
  Administered 2011-07-03: 5 mg via RESPIRATORY_TRACT
  Filled 2011-07-03: qty 1

## 2011-07-03 MED ORDER — ALBUTEROL SULFATE HFA 108 (90 BASE) MCG/ACT IN AERS: 2.0000 | INHALATION_SPRAY | RESPIRATORY_TRACT | Status: DC | PRN

## 2011-07-03 MED ORDER — METFORMIN HCL 500 MG PO TABS: 500.0000 mg | ORAL_TABLET | Freq: Every day | ORAL | Status: DC

## 2011-07-03 MED ORDER — QUETIAPINE FUMARATE 100 MG PO TABS: 100.0000 mg | ORAL_TABLET | Freq: Three times a day (TID) | ORAL | Status: DC

## 2011-07-03 MED ORDER — LORAZEPAM 1 MG PO TABS
ORAL_TABLET | ORAL | Status: AC
  Administered 2011-07-03: 1 mg
  Filled 2011-07-03: qty 1

## 2011-07-03 MED ORDER — CHLORDIAZEPOXIDE HCL 25 MG PO CAPS: ORAL_CAPSULE | ORAL | Status: DC

## 2011-07-03 MED ORDER — ADULT MULTIVITAMIN W/MINERALS CH: 1.0000 | ORAL_TABLET | Freq: Every day | ORAL | Status: DC

## 2011-07-03 MED ORDER — THIAMINE HCL 100 MG PO TABS: 100.0000 mg | ORAL_TABLET | Freq: Every day | ORAL | Status: DC

## 2011-07-03 NOTE — Progress Notes (Signed)
[facility] Case Management Discharge Plan:  Will you be returning to the same living situation after discharge: No. At discharge, do you have transportation home?:Yes,  pt's mother to pick pt up Do you have the ability to pay for your medications:Yes,  access to meds  Release of information consent forms completed and in the chart;  Patient's signature needed at discharge.  Patient to Follow up at:  Follow-up Information    Follow up with [facility]  on 07/15/2011. (Arrive there at 8:00 AM (If catching the bus, take the bus to [place] on [address].  Call [facility] when you arrive and they will pick you up from there))    Contact information:   [address] [place], [place] [address] [phone]      Follow up with [facility] the [place] on 07/07/2011. (Walk in clinic Monday-Friday 8-12 pm)    Contact information:   [address], [place] [address] [phone]      Follow up with [facility] (for medical follow up). (Call to make an appointment!)    Contact information:   [address] [place]. [place], [place] [address] [phone]         Patient denies SI/HI:   Yes,  pt denies SI/HI    Safety Planning and Suicide Prevention discussed:  Yes,  discussed with pt  Barrier to discharge identified:No.  Summary and Recommendations: Pt attended discharge planning group and actively participated.  Pt presents with bright affect and mood.  Pt reports feeling stable to d/c today.  Pt denies depression and reports mild anxiety.  Pt states that she reconciled with her mother and will be residing with her upon d/c.  Pt states that she is excited about her future and about getting further treatment at [facility].  No recommendations from SW.  No further needs voiced by pt.  Pt stable to discharge.     Carmina Miller 07/03/2011, 9:21 AM

## 2011-07-03 NOTE — Tx Team (Signed)
Interdisciplinary Treatment Plan Update (Adult)  Date:  07/03/2011  Time Reviewed:  10:03 AM   Progress in Treatment: Attending groups: Yes Participating in groups:  Yes Taking medication as prescribed: Yes Tolerating medication:  Yes Family/Significant othe contact made:  Yes - contact made with mother Patient understands diagnosis:  Yes Discussing patient identified problems/goals with staff:  Yes Medical problems stabilized or resolved:  Yes Denies suicidal/homicidal ideation: Yes Issues/concerns per patient self-inventory:  None identified Other: N/A  New problem(s) identified: None Identified  Reason for Continuation of Hospitalization: Stable to d/c  Interventions implemented related to continuation of hospitalization: Stable to d/c  Additional comments: N/A  Estimated length of stay: D/C today  Discharge Plan: Pt will follow up at [facility] and [facility] for [facility] and therapy.   New goal(s): N/A  Review of initial/current patient goals per problem list:    1.  Goal(s): Address substance use  Met:  Yes  Target date: by discharge  As evidenced by: completed detox protocol and referred to appropriate treatment  2.  Goal (s): Reduce depressive and anxiety symptoms  Met:  Yes  Target date: by discharge  As evidenced by: Reducing depression from a 10 to a 3 as reported by pt.  Pt denies depression and anxiety today.   3.  Goal(s): Eliminate SI  Met:  Yes  Target date: by discharge  As evidenced by: Pt denies SI.    Attendees: Patient:  Kathleen [name]  07/03/2011 10:05 AM   Family:     Physician:  Lupe Carney, DO 07/03/2011 10:03 AM   Nursing: Carolynn Comment, RN 07/03/2011 10:03 AM   Case Manager:  Reyes Ivan, LCSWA 07/03/2011  10:03 AM   Counselor:  Ronda Fairly, LCSWA 07/03/2011  10:03 AM   Other:  Richelle Ito, LCSW 07/03/2011 10:03 AM   Other:  Tanya Nones, SW intern 07/03/2011  10:05 AM   Other:     Other:      Scribe for Treatment Team:   Reyes Ivan 07/03/2011 10:03 AM
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
## 2011-07-03 NOTE — ED Provider Notes (Signed)
History     CSN: [id number]  Arrival date & time 07/02/11  1730   First MD Initiated Contact with Patient 07/02/11 2358      Chief Complaint  Patient presents with  . Shortness of Breath    (Consider location/radiation/quality/duration/timing/severity/associated sxs/prior treatment) HPI Comments: 46 year old female who presents with the complaint of shortness of breath. According to the medical record she is a long-time cocaine abuser and has a child with asthma. She states over the last 3 months she has had gradual onset, persistent shortness of breath with wheezing. Some days this is more significant than others, and over the last few days while she has been at behavioral health center for her cocaine abuse she has felt more wheezing and shortness of breath. She was given an albuterol treatment at one time which helped significantly but that has not had a repeat treatment and is felt more wheezing over the last few days. She admits to a cough which is productive of phlegm but denies swelling in the legs, chest pain, back pain, travel, immobilization, recent surgery, estrogen use.  Patient is a 46 y.o. female presenting with shortness of breath. The history is provided by the patient and medical records.  Shortness of Breath  Associated symptoms include shortness of breath.    Past Medical History  Diagnosis Date  . Anxiety   . Post traumatic stress disorder   . Diabetes mellitus     History reviewed. No pertinent past surgical history.  History reviewed. No pertinent family history.  History  Substance Use Topics  . Smoking status: Not on file  . Smokeless tobacco: Not on file  . Alcohol Use: No    OB History    Grav Para Term Preterm Abortions TAB SAB Ect Mult Living                  Review of Systems  Respiratory: Positive for shortness of breath.   All other systems reviewed and are negative.    Allergies  Review of patient's allergies indicates no known  allergies.  Home Medications   Current Outpatient Rx  Name Route Sig Dispense Refill  . INSULIN ASPART 100 UNIT/ML [employer] SOLN Subcutaneous Inject 1 Units into the skin 2 (two) times daily with a meal.    . INSULIN GLARGINE 100 UNIT/ML [employer] SOLN Subcutaneous Inject 1 Units into the skin at bedtime.    . ALBUTEROL SULFATE HFA 108 (90 BASE) MCG/ACT IN AERS Inhalation Inhale 2 puffs into the lungs every 4 (four) hours as needed for wheezing or shortness of breath. 1 Inhaler 3  . ALPRAZOLAM 1 MG PO TABS Oral Take 1 mg by mouth 4 (four) times daily.     . QUETIAPINE FUMARATE 50 MG PO TABS Oral Take 50 mg by mouth 2 (two) times daily.       BP 109/63  Pulse 91  Temp(Src) 98.1 F (36.7 C) (Oral)  Resp 22  Ht 5\' 3"  (1.6 m)  Wt 159 lb (72.122 kg)  BMI 28.17 kg/m2  SpO2 97%  LMP 06/01/2011  Physical Exam  Nursing note and vitals reviewed. Constitutional: She appears well-developed and well-nourished. No distress.  HENT:  Head: Normocephalic and atraumatic.  Mouth/Throat: Oropharynx is clear and moist. No oropharyngeal exudate.  Eyes: Conjunctivae and EOM are normal. Pupils are equal, round, and reactive to light. Right eye exhibits no discharge. Left eye exhibits no discharge. No scleral icterus.  Neck: Normal range of motion. Neck supple. No JVD present. No  thyromegaly present.  Cardiovascular: Normal rate, regular rhythm, normal heart sounds and intact distal pulses.  Exam reveals no gallop and no friction rub.   No murmur heard. Pulmonary/Chest: Effort normal. No respiratory distress. She has wheezes ( slight exp wheezing, no distress). She has no rales.  Abdominal: Soft. Bowel sounds are normal. She exhibits no distension and no mass. There is no tenderness.  Musculoskeletal: Normal range of motion. She exhibits no edema and no tenderness.  Lymphadenopathy:    She has no cervical adenopathy.  Neurological: She is alert. Coordination normal.  Skin: Skin is warm and dry. No rash noted. No  erythema.  Psychiatric: She has a normal mood and affect. Her behavior is normal.    ED Course  Procedures (including critical care time)  Labs Reviewed  GLUCOSE, CAPILLARY - Abnormal; Notable for the following:    Glucose-Capillary 152 (*)    All other components within normal limits  HEMOGLOBIN A1C - Abnormal; Notable for the following:    Hemoglobin A1C 5.7 (*)    Mean Plasma Glucose 117 (*)    All other components within normal limits  GLUCOSE, CAPILLARY - Abnormal; Notable for the following:    Glucose-Capillary 136 (*)    All other components within normal limits  GLUCOSE, CAPILLARY - Abnormal; Notable for the following:    Glucose-Capillary 104 (*)    All other components within normal limits  GLUCOSE, CAPILLARY - Abnormal; Notable for the following:    Glucose-Capillary 103 (*)    All other components within normal limits  GLUCOSE, CAPILLARY - Abnormal; Notable for the following:    Glucose-Capillary 114 (*)    All other components within normal limits  GLUCOSE, CAPILLARY - Abnormal; Notable for the following:    Glucose-Capillary 122 (*)    All other components within normal limits  GLUCOSE, CAPILLARY - Abnormal; Notable for the following:    Glucose-Capillary 175 (*)    All other components within normal limits  GLUCOSE, CAPILLARY - Abnormal; Notable for the following:    Glucose-Capillary 110 (*)    All other components within normal limits  GLUCOSE, CAPILLARY - Abnormal; Notable for the following:    Glucose-Capillary 100 (*)    All other components within normal limits  GLUCOSE, CAPILLARY - Abnormal; Notable for the following:    Glucose-Capillary 140 (*)    All other components within normal limits  GLUCOSE, CAPILLARY - Abnormal; Notable for the following:    Glucose-Capillary 110 (*)    All other components within normal limits  GLUCOSE, CAPILLARY - Abnormal; Notable for the following:    Glucose-Capillary 117 (*)    All other components within normal  limits  GLUCOSE, CAPILLARY - Abnormal; Notable for the following:    Glucose-Capillary 154 (*)    All other components within normal limits  GLUCOSE, CAPILLARY - Abnormal; Notable for the following:    Glucose-Capillary 119 (*)    All other components within normal limits  GLUCOSE, CAPILLARY - Abnormal; Notable for the following:    Glucose-Capillary 104 (*)    All other components within normal limits  GLUCOSE, CAPILLARY - Abnormal; Notable for the following:    Glucose-Capillary 164 (*)    All other components within normal limits  GLUCOSE, CAPILLARY - Abnormal; Notable for the following:    Glucose-Capillary 184 (*)    All other components within normal limits  GLUCOSE, CAPILLARY - Abnormal; Notable for the following:    Glucose-Capillary 149 (*)    All other components within  normal limits  GLUCOSE, CAPILLARY - Abnormal; Notable for the following:    Glucose-Capillary 153 (*)    All other components within normal limits  GLUCOSE, CAPILLARY - Abnormal; Notable for the following:    Glucose-Capillary 142 (*)    All other components within normal limits  GLUCOSE, CAPILLARY - Abnormal; Notable for the following:    Glucose-Capillary 111 (*)    All other components within normal limits  GLUCOSE, CAPILLARY - Abnormal; Notable for the following:    Glucose-Capillary 110 (*)    All other components within normal limits  GLUCOSE, CAPILLARY - Abnormal; Notable for the following:    Glucose-Capillary 182 (*)    All other components within normal limits  GLUCOSE, CAPILLARY - Abnormal; Notable for the following:    Glucose-Capillary 123 (*)    All other components within normal limits  GLUCOSE, CAPILLARY - Abnormal; Notable for the following:    Glucose-Capillary 108 (*)    All other components within normal limits  GLUCOSE, CAPILLARY - Abnormal; Notable for the following:    Glucose-Capillary 137 (*)    All other components within normal limits  GLUCOSE, CAPILLARY - Abnormal;  Notable for the following:    Glucose-Capillary 145 (*)    All other components within normal limits  GLUCOSE, CAPILLARY - Abnormal; Notable for the following:    Glucose-Capillary 100 (*)    All other components within normal limits  GLUCOSE, CAPILLARY - Abnormal; Notable for the following:    Glucose-Capillary 100 (*)    All other components within normal limits  GLUCOSE, CAPILLARY - Abnormal; Notable for the following:    Glucose-Capillary 104 (*)    All other components within normal limits  GLUCOSE, CAPILLARY - Abnormal; Notable for the following:    Glucose-Capillary 100 (*)    All other components within normal limits  GLUCOSE, CAPILLARY - Abnormal; Notable for the following:    Glucose-Capillary 111 (*)    All other components within normal limits  GLUCOSE, CAPILLARY  GLUCOSE, CAPILLARY  GLUCOSE, CAPILLARY  GLUCOSE, CAPILLARY  GLUCOSE, CAPILLARY   Dg Chest 2 View  07/03/2011  *RADIOLOGY REPORT*  Clinical Data: Shortness of breath.  CHEST - 2 VIEW  Comparison: 02/23/2010  Findings: Two views of the chest were obtained.  The lungs are clear without airspace disease or effusions.  Heart size is within normal limits.  Trachea is midline.  IMPRESSION: No acute chest findings.  Original Report Authenticated By: Richarda Overlie, M.D.     1. Bipolar 1 disorder, manic, mild   2. Cocaine abuse   3. PTSD (post-traumatic stress disorder)   4. Shortness of breath       MDM  Well appearing with subacute symptoms of months of shortness of breath and intermittent wheezing. Currently her oxygen saturations are 99% on room air, has minimal expiratory wheezing and is able to speak in full sentences without accessory muscle use. We'll proceed with chest x-ray an albuterol treatment. She may need ongoing treatment with albuterol MDI intermittently while at behavioral health. Doubt pneumonia, pneumothorax, congestive heart failure or other sources of shortness of breath and presentation and  physical exam.   Lungs essentially clear at this time, chest x-ray according to my interpretation and the radiologist read shows no acute findings. Oxygenation is normal, will discharge back to behavioral health with an MDI to be used as needed.  Vida Roller, MD 07/03/11 [phone]

## 2011-07-03 NOTE — Progress Notes (Signed)
Pt D/C home with a bus pass. Pt rx and follow-up visit were discussed. Pt denies SI/HI. Pt belongings were returned.

## 2011-07-03 NOTE — Progress Notes (Signed)
Pt returned to the unit from the ED.  Was sent over d/t SOB and also told staff that she was not able to hear clearly.   ED RN reported that pt had been given a breathing treatment, but her symptoms appeared to be related to her Mood d/o.  Her HS meds were given at the ED.  On arrival back to the unit, pt asked for her HS meds.  Told her she had been given those at the ED.  Had to show the pt documentation where she had received the meds.  Still wanted another Trazodone.  Told her she could get Vistaril 50mg .  Also called MD on call to activate the order for albuterol the pt brought from the ED.  Pt was given the Vistaril and inhaler.  Was also given a box meal.  Pt then went to her room.  Safety maintained with q15 minute checks.

## 2011-07-03 NOTE — ED Notes (Signed)
Pt transported to [facility] by [employer].

## 2011-07-03 NOTE — Discharge Instructions (Signed)
Your x-ray showed no signs of pneumonia. Please take the albuterol inhaler 2 puffs every 4 hours as needed for shortness of breath or wheezing. If you should develop severe pain, difficulty breathing or high fevers return to the emergency department immediately.

## 2011-07-08 NOTE — Discharge Summary (Signed)
Physician Discharge Summary Note  Patient:  Kathleen [name] is an 46 y.o., female MRN:  [id number] DOB:  01-Nov-1965 Patient phone:  [phone] (home)  Patient address:   [address] [place] [address],   Date of Admission:  06/23/2011 Date of Discharge: 07/03/2011  Discharge Diagnoses: Principal Problem:  *Cocaine abuse Active Problems:  Bipolar 1 disorder, manic, mild  PTSD (post-traumatic stress disorder)  Homeless  Cocaine abuse with cocaine-induced psychotic disorder   Axis Diagnosis:   AXIS I:  Substance Induced Psychosis; PTSD AXIS II:  deferred AXIS III:  Bilateral Cerumin Impaction; Asthma; diabetes II, controlled Past Medical History  Diagnosis Date  . Anxiety   . Post traumatic stress disorder   . Diabetes mellitus    AXIS IV:  deferred AXIS V:  61-70 mild symptoms  Level of Care:  OP  Hospital Course:  Kathleen [name] presented with an exacerbation of depression, agitation and some auditory hallucinations, she was suicidal with plan to cut her throat with a kitchen knife. She admitted that she had been using cocaine. She was admitted to our detox program and her Seroquel was restarted, which she had not taken for some time. This was used in order to address her agitation and psychotic symptoms. She was quite agitated for the first few days, with irritable mood, and could not promise safety. She was on one-to-one observation until was felt she could be safe here on the unit.  She eventually responded well to the medications. And olanzapine was added to improve sleep and stability. Her blood sugar remained stable while here. She was seen in the emergency room for exacerbation of her asthma symptoms and stabilized with albuterol. She was also found to have a significant cerumen impaction both ears and was started on Debrox drops for this. She tolerated medications well and was stable for discharge by February 28. Affect stable, insight much improved, and ready to pursue  outpatient treatment.  Consults:  Seen in ED for asthma symptoms, Albuterol Prescribed  Significant Diagnostic Studies:  Normal CBC and Metabolic Panel.  Urinalysis positive for 11-20 WBC/hpf; Urine Drug Screen positive for cocaine.   Discharge Vitals:   Blood pressure 124/73, pulse 101, temperature 99.3 F (37.4 C), temperature source Oral, resp. rate 17, height 5\' 3"  (1.6 m), weight 72.122 kg (159 lb), last menstrual period 06/01/2011, SpO2 97.00%.  Mental Status Exam: See Mental Status Examination and Suicide Risk Assessment completed by Attending Physician prior to discharge.  Discharge destination:  Home  Is patient on multiple antipsychotic therapies at discharge: yes Has Patient had three or more failed trials of antipsychotic monotherapy by history: not known, patient is poor historian.  Recommended Plan for Multiple Antipsychotic Therapies: Consider eliminating/tapering the zyprexa after 30 days.    Medication List  As of 07/08/2011 11:42 AM   STOP taking these medications         ALPRAZolam 1 MG tablet      insulin aspart 100 UNIT/ML injection         TAKE these medications      Indication    albuterol 108 (90 BASE) MCG/ACT inhaler   Commonly known as: PROVENTIL HFA;VENTOLIN HFA   Inhale 2 puffs into the lungs every 4 (four) hours as needed for wheezing or shortness of breath.       carbamide peroxide 6.5 % otic solution   Commonly known as: DEBROX   Place 5 drops into both ears 2 (two) times daily. To dissolve ear wax.  chlordiazePOXIDE 25 MG capsule   Commonly known as: LIBRIUM   Take one cap at bedtime for the next three nights and you are finished with detox. For detox from xanax.       citalopram 20 MG tablet   Commonly known as: CELEXA   Take 1 tablet (20 mg total) by mouth daily.       insulin glargine 100 UNIT/ML injection   Commonly known as: LANTUS   Inject 1 Units into the skin at bedtime. For diabetes       metFORMIN 500 MG tablet    Commonly known as: GLUCOPHAGE   Take 1 tablet (500 mg total) by mouth daily with breakfast. For diabetes.       mulitivitamin with minerals Tabs   Take 1 tablet by mouth daily. Vitamin supplement for health.       OLANZapine 2.5 MG tablet   Commonly known as: ZYPREXA   Take one tab (2.5mg ) daily in the morning and at bedtime for clear thoughts.       QUEtiapine 100 MG tablet   Commonly known as: SEROQUEL   Take 1 tablet (100 mg total) by mouth 3 (three) times daily. For mood stability.       thiamine 100 MG tablet   Take 1 tablet (100 mg total) by mouth daily. Vit B1 supplement.       traZODone 50 MG tablet   Commonly known as: DESYREL   Take 1 tablet (50 mg total) by mouth at bedtime. For sleep.            Follow-up Information    Follow up with [facility]  on 07/15/2011. (Arrive there at 8:00 AM (If catching the bus, take the bus to [place] on [address] stop.  Call [facility] when you arrive and they will pick you up from there))    Contact information:   [address] [place]. [place], [place] [address] [phone]      Follow up with [facility] [place] on 07/07/2011. (Walk in clinic Monday-Friday 8-12 pm)    Contact information:   [address], [place] [address] [phone]      Follow up with [facility] (for medical follow up). (Call to make an appointment!)    Contact information:   [address] [place], [place] [address] [phone]         Follow-up recommendations:  Activity:  unremarkable Diet:  regular  Valita Righter A 07/08/2011, 11:42 AM

## 2011-07-09 NOTE — Progress Notes (Signed)
Patient Discharge Instructions:  Psychiatric Admission Assessment Note Faxed,  07/08/2011 Discharge Summary Note Faxed,   07/08/2011 After Visit Summary (AVS) Faxed,  07/08/2011 Face Sheet Faxed, 07/08/2011 Faxed to the Next Level Care provider:  07/08/2011  Faxed to [facility] of the [facility] @ [phone] And to [facility] @ [phone]  Wandra Scot, 07/09/2011, 12:12 PM

## 2011-07-26 ENCOUNTER — Emergency Department (HOSPITAL_BASED_OUTPATIENT_CLINIC_OR_DEPARTMENT_OTHER)
Admission: EM | Admit: 2011-07-26 | Discharge: 2011-07-26 | Disposition: A | Discharge status: Home / Self Care | Payer: Medicaid Other | Attending: Emergency Medicine | Admitting: Emergency Medicine

## 2011-07-26 ENCOUNTER — Encounter (HOSPITAL_BASED_OUTPATIENT_CLINIC_OR_DEPARTMENT_OTHER): Payer: Self-pay | Admitting: *Deleted

## 2011-07-26 DIAGNOSIS — F209 Schizophrenia, unspecified: Secondary | ICD-10-CM | POA: Insufficient documentation

## 2011-07-26 DIAGNOSIS — F410 Panic disorder [episodic paroxysmal anxiety] without agoraphobia: Secondary | ICD-10-CM | POA: Insufficient documentation

## 2011-07-26 DIAGNOSIS — F319 Bipolar disorder, unspecified: Secondary | ICD-10-CM | POA: Insufficient documentation

## 2011-07-26 DIAGNOSIS — E119 Type 2 diabetes mellitus without complications: Secondary | ICD-10-CM | POA: Insufficient documentation

## 2011-07-26 DIAGNOSIS — N390 Urinary tract infection, site not specified: Secondary | ICD-10-CM

## 2011-07-26 HISTORY — DX: Schizoaffective disorder, bipolar type: F25.0

## 2011-07-26 LAB — URINALYSIS, ROUTINE W REFLEX MICROSCOPIC
Protein, ur: NEGATIVE mg/dL
Urobilinogen, UA: 1 mg/dL (ref 0.0–1.0)

## 2011-07-26 LAB — PREGNANCY, URINE: Preg Test, Ur: NEGATIVE

## 2011-07-26 MED ORDER — OLANZAPINE 2.5 MG PO TABS: 2.5000 mg | ORAL_TABLET | Freq: Every day | ORAL | Status: DC

## 2011-07-26 MED ORDER — SULFAMETHOXAZOLE-TRIMETHOPRIM 800-160 MG PO TABS: 1.0000 | ORAL_TABLET | Freq: Two times a day (BID) | ORAL | Status: AC

## 2011-07-26 NOTE — Discharge Instructions (Signed)

## 2011-07-26 NOTE — ED Notes (Signed)
Pt has extensive psych history with recent stay in [facility].  Pt has pack of papers from [facility] with current meds.  Pt was discharged with no rx given and [facility] will not fill meds without rx.  Pt has not had meds for 4 days.  Asked pt if she was currently SI and she said "no just a little testy because I have not had my meds"  Pt is very cooperative at present

## 2011-07-26 NOTE — ED Notes (Signed)
Pt presents to ED today with c/o UTI and panic attackes.  Pt is followed at [facility] and states that they stopped her Zyprexa because there was no "doctors order"

## 2011-07-26 NOTE — ED Provider Notes (Signed)
History     CSN: [id number]  Arrival date & time 07/26/11  1914   First MD Initiated Contact with Patient 07/26/11 1952      Chief Complaint  Patient presents with  . Urinary Tract Infection  . Panic Attack    (Consider location/radiation/quality/duration/timing/severity/associated sxs/prior treatment) HPI Comments: Pt states that her urine has an odor of ammonia and that is why she thinks that she has a uti:pt states that she has not had her zyprexa recently and because she is at [facility] she needs a script for them to start giving it to her:pt denies si/hi  Patient is a 46 y.o. female presenting with urinary tract infection. The history is provided by the patient.  Urinary Tract Infection This is a new problem. The current episode started in the past 7 days. The problem occurs constantly. The problem has been unchanged. Pertinent negatives include no abdominal pain, fever or vomiting. The symptoms are aggravated by nothing. She has tried nothing for the symptoms.    Past Medical History  Diagnosis Date  . Anxiety   . Post traumatic stress disorder   . Diabetes mellitus   . Schizoaffective disorder, bipolar type     History reviewed. No pertinent past surgical history.  History reviewed. No pertinent family history.  History  Substance Use Topics  . Smoking status: Not on file  . Smokeless tobacco: Not on file  . Alcohol Use: No    OB History    Grav Para Term Preterm Abortions TAB SAB Ect Mult Living                  Review of Systems  Constitutional: Negative for fever.  Respiratory: Negative.   Cardiovascular: Negative.   Gastrointestinal: Negative for vomiting and abdominal pain.  Genitourinary: Negative for vaginal discharge.  Musculoskeletal: Negative.   Neurological: Negative.     Allergies  Review of patient's allergies indicates no known allergies.  Home Medications   Current Outpatient Rx  Name Route Sig Dispense Refill  . ALBUTEROL SULFATE  HFA 108 (90 BASE) MCG/ACT IN AERS Inhalation Inhale 2 puffs into the lungs every 4 (four) hours as needed for wheezing or shortness of breath. 1 Inhaler 3  . CITALOPRAM HYDROBROMIDE 20 MG PO TABS Oral Take 1 tablet (20 mg total) by mouth daily. 30 tablet 0  . HYDROXYZINE PAMOATE 50 MG PO CAPS Oral Take 50 mg by mouth 3 (three) times daily.    . QUETIAPINE FUMARATE 300 MG PO TABS Oral Take 300 mg by mouth at bedtime.      BP 103/78  Pulse 101  Temp 98.3 F (36.8 C)  Resp 20  SpO2 98%  Physical Exam  Nursing note and vitals reviewed. Constitutional: She appears well-developed and well-nourished.  HENT:  Head: Normocephalic and atraumatic.  Eyes: EOM are normal.  Cardiovascular: Normal rate and regular rhythm.   Pulmonary/Chest: Effort normal and breath sounds normal.  Musculoskeletal: Normal range of motion.  Neurological: She is alert.  Skin: Skin is warm and dry.  Psychiatric:       Pt is anxious    ED Course  Procedures (including critical care time)  Labs Reviewed  URINALYSIS, ROUTINE W REFLEX MICROSCOPIC - Abnormal; Notable for the following:    Leukocytes, UA SMALL (*)    All other components within normal limits  URINE MICROSCOPIC-ADD ON - Abnormal; Notable for the following:    Squamous Epithelial / LPF MANY (*)    Bacteria, UA MANY (*)  All other components within normal limits  PREGNANCY, URINE   No results found.   1. UTI (lower urinary tract infection)   2. Bipolar 1 disorder       MDM  Will treat for ZOX:WRUEAV at discharge from [facility] an they noted they were going to try and get her off her zyprexa:discussed with pt only giving it once a day instead of twice:pt states that she is not tolerating being off it        Teressa Lower, NP 07/26/11 2037

## 2011-07-27 NOTE — ED Provider Notes (Signed)
Medical screening examination/treatment/procedure(s) were performed by non-physician practitioner and as supervising physician I was immediately available for consultation/collaboration.  Malayna Noori T Corwin Kuiken, MD 07/27/11 2333 

## 2011-07-28 NOTE — ED Notes (Signed)
Counselor Victorino Dike at [facility] called to confirm that this patient did receive a written prescription for zyprexa 2.5 mg to take once per day at bedtime. This was verified, therefore  The reason for accessing this chart is verification of prescribed meds

## 2011-11-22 ENCOUNTER — Emergency Department (HOSPITAL_COMMUNITY)
Admission: EM | Admit: 2011-11-22 | Discharge: 2011-11-24 | Disposition: A | Discharge status: Other Acute Inpatient Hospital | Payer: Self-pay | Attending: Emergency Medicine | Admitting: Emergency Medicine

## 2011-11-22 ENCOUNTER — Encounter (HOSPITAL_COMMUNITY): Payer: Self-pay | Admitting: Emergency Medicine

## 2011-11-22 DIAGNOSIS — E119 Type 2 diabetes mellitus without complications: Secondary | ICD-10-CM | POA: Insufficient documentation

## 2011-11-22 DIAGNOSIS — F1410 Cocaine abuse, uncomplicated: Secondary | ICD-10-CM | POA: Insufficient documentation

## 2011-11-22 DIAGNOSIS — F411 Generalized anxiety disorder: Secondary | ICD-10-CM | POA: Insufficient documentation

## 2011-11-22 DIAGNOSIS — Z87891 Personal history of nicotine dependence: Secondary | ICD-10-CM | POA: Insufficient documentation

## 2011-11-22 DIAGNOSIS — F329 Major depressive disorder, single episode, unspecified: Secondary | ICD-10-CM | POA: Insufficient documentation

## 2011-11-22 DIAGNOSIS — Z23 Encounter for immunization: Secondary | ICD-10-CM | POA: Insufficient documentation

## 2011-11-22 DIAGNOSIS — Z794 Long term (current) use of insulin: Secondary | ICD-10-CM | POA: Insufficient documentation

## 2011-11-22 DIAGNOSIS — F3289 Other specified depressive episodes: Secondary | ICD-10-CM | POA: Insufficient documentation

## 2011-11-22 DIAGNOSIS — R45851 Suicidal ideations: Secondary | ICD-10-CM | POA: Insufficient documentation

## 2011-11-22 HISTORY — DX: Unspecified asthma, uncomplicated: J45.909

## 2011-11-22 LAB — RAPID URINE DRUG SCREEN, HOSP PERFORMED
Amphetamines: NOT DETECTED
Barbiturates: NOT DETECTED
Cocaine: POSITIVE — AB
Tetrahydrocannabinol: NOT DETECTED

## 2011-11-22 LAB — CBC
HCT: 39.7 % (ref 36.0–46.0)
MCHC: 35.3 g/dL (ref 30.0–36.0)
MCV: 72.1 fL — ABNORMAL LOW (ref 78.0–100.0)
RDW: 15.4 % (ref 11.5–15.5)

## 2011-11-22 LAB — BASIC METABOLIC PANEL
BUN: 16 mg/dL (ref 6–23)
Chloride: 99 mEq/L (ref 96–112)
Creatinine, Ser: 0.95 mg/dL (ref 0.50–1.10)
GFR calc Af Amer: 83 mL/min — ABNORMAL LOW (ref >90)
GFR calc non Af Amer: 71 mL/min — ABNORMAL LOW (ref >90)
Glucose, Bld: 85 mg/dL (ref 70–99)

## 2011-11-22 LAB — GLUCOSE, CAPILLARY
Glucose-Capillary: 97 mg/dL (ref 70–99)
Glucose-Capillary: 99 mg/dL (ref 70–99)

## 2011-11-22 LAB — POCT PREGNANCY, URINE: Preg Test, Ur: NEGATIVE

## 2011-11-22 MED ORDER — ALBUTEROL SULFATE HFA 108 (90 BASE) MCG/ACT IN AERS: 2.0000 | INHALATION_SPRAY | RESPIRATORY_TRACT | Status: DC | PRN

## 2011-11-22 MED ORDER — LORAZEPAM 1 MG PO TABS
1.0000 mg | ORAL_TABLET | Freq: Once | ORAL | Status: AC
Start: 2011-11-22 — End: 2011-11-22
  Administered 2011-11-22: 1 mg via ORAL
  Filled 2011-11-22: qty 1

## 2011-11-22 MED ORDER — OLANZAPINE 2.5 MG PO TABS
2.5000 mg | ORAL_TABLET | Freq: Every day | ORAL | Status: DC
  Administered 2011-11-22 – 2011-11-23 (×2): 2.5 mg via ORAL
  Filled 2011-11-22 (×2): qty 1

## 2011-11-22 MED ORDER — QUETIAPINE FUMARATE 300 MG PO TABS
300.0000 mg | ORAL_TABLET | Freq: Every day | ORAL | Status: DC
  Administered 2011-11-22 – 2011-11-23 (×2): 300 mg via ORAL
  Filled 2011-11-22 (×2): qty 1

## 2011-11-22 MED ORDER — CITALOPRAM HYDROBROMIDE 20 MG PO TABS
20.0000 mg | ORAL_TABLET | Freq: Every day | ORAL | Status: DC
  Administered 2011-11-22 – 2011-11-24 (×3): 20 mg via ORAL
  Filled 2011-11-22 (×3): qty 1

## 2011-11-22 MED ORDER — LORAZEPAM 1 MG PO TABS
1.0000 mg | ORAL_TABLET | Freq: Three times a day (TID) | ORAL | Status: DC | PRN
  Administered 2011-11-22: 1 mg via ORAL
  Filled 2011-11-22: qty 1

## 2011-11-22 MED ORDER — ALPRAZOLAM 0.5 MG PO TABS
0.5000 mg | ORAL_TABLET | Freq: Four times a day (QID) | ORAL | Status: DC
  Administered 2011-11-22 (×2): 0.5 mg via ORAL
  Filled 2011-11-22 (×4): qty 1
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
## 2011-11-22 NOTE — ED Notes (Signed)
ACT into see 
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
## 2011-11-23 LAB — GLUCOSE, CAPILLARY
Glucose-Capillary: 98 mg/dL (ref 70–99)
Glucose-Capillary: 118 mg/dL — ABNORMAL HIGH (ref 70–99)

## 2011-11-23 NOTE — ED Notes (Signed)
Aura Camps RN in pt room doing telepsych

## 2011-11-23 NOTE — ED Provider Notes (Addendum)
Pt resting quietly. Nad. Vitals normal. Discussed w act team - awaiting placement.   Suzi Roots, MD 11/23/11 0940  Pt alert, content. No distress. Awaiting psych placement.   Suzi Roots, MD 11/24/11 [phone]  Act team indicates pt accepted to [facility], Dr Allena Katz, bed ready.   Suzi Roots, MD 11/24/11 1401

## 2011-11-23 NOTE — ED Notes (Signed)
Pt refused finger stick blood sugar check

## 2011-11-24 ENCOUNTER — Inpatient Hospital Stay (HOSPITAL_COMMUNITY)
Admission: AD | Admit: 2011-11-24 | Discharge: 2011-11-29 | DRG: 897 | Disposition: A | Discharge status: Home / Self Care | Payer: Federal, State, Local not specified - Other | Source: Ambulatory Visit | Attending: Psychiatry | Admitting: Psychiatry

## 2011-11-24 ENCOUNTER — Encounter (HOSPITAL_COMMUNITY): Payer: Self-pay

## 2011-11-24 DIAGNOSIS — F14159 Cocaine abuse with cocaine-induced psychotic disorder, unspecified: Secondary | ICD-10-CM

## 2011-11-24 DIAGNOSIS — F3160 Bipolar disorder, current episode mixed, unspecified: Secondary | ICD-10-CM | POA: Diagnosis present

## 2011-11-24 DIAGNOSIS — F1999 Other psychoactive substance use, unspecified with unspecified psychoactive substance-induced disorder: Principal | ICD-10-CM | POA: Diagnosis present

## 2011-11-24 DIAGNOSIS — Z5900 Homelessness unspecified: Secondary | ICD-10-CM

## 2011-11-24 DIAGNOSIS — Z79899 Other long term (current) drug therapy: Secondary | ICD-10-CM

## 2011-11-24 DIAGNOSIS — E119 Type 2 diabetes mellitus without complications: Secondary | ICD-10-CM | POA: Diagnosis present

## 2011-11-24 DIAGNOSIS — J45909 Unspecified asthma, uncomplicated: Secondary | ICD-10-CM | POA: Diagnosis present

## 2011-11-24 DIAGNOSIS — F3111 Bipolar disorder, current episode manic without psychotic features, mild: Secondary | ICD-10-CM

## 2011-11-24 DIAGNOSIS — F1410 Cocaine abuse, uncomplicated: Secondary | ICD-10-CM | POA: Diagnosis present

## 2011-11-24 DIAGNOSIS — F4310 Post-traumatic stress disorder, unspecified: Secondary | ICD-10-CM | POA: Diagnosis present

## 2011-11-24 LAB — GLUCOSE, CAPILLARY
Glucose-Capillary: 109 mg/dL — ABNORMAL HIGH (ref 70–99)
Glucose-Capillary: 118 mg/dL — ABNORMAL HIGH (ref 70–99)
Glucose-Capillary: 103 mg/dL — ABNORMAL HIGH (ref 70–99)

## 2011-11-24 MED ORDER — BUSPIRONE HCL 10 MG PO TABS
10.0000 mg | ORAL_TABLET | Freq: Three times a day (TID) | ORAL | Status: DC
  Administered 2011-11-24 – 2011-11-29 (×14): 10 mg via ORAL
  Filled 2011-11-24: qty 42
  Filled 2011-11-24 (×12): qty 1
  Filled 2011-11-24: qty 42
  Filled 2011-11-24 (×7): qty 1
  Filled 2011-11-24: qty 42

## 2011-11-24 MED ORDER — LORAZEPAM 1 MG PO TABS
1.0000 mg | ORAL_TABLET | Freq: Four times a day (QID) | ORAL | Status: DC | PRN
  Administered 2011-11-24: 1 mg via ORAL
  Filled 2011-11-24: qty 1

## 2011-11-24 MED ORDER — ALUM & MAG HYDROXIDE-SIMETH 200-200-20 MG/5ML PO SUSP: 30.0000 mL | ORAL | Status: DC | PRN

## 2011-11-24 MED ORDER — TRAZODONE HCL 100 MG PO TABS: 100.0000 mg | ORAL_TABLET | Freq: Every evening | ORAL | Status: DC | PRN

## 2011-11-24 MED ORDER — BUSPIRONE HCL 10 MG PO TABS
ORAL_TABLET | ORAL | Status: AC
  Filled 2011-11-24: qty 1

## 2011-11-24 MED ORDER — HYDROXYZINE HCL 25 MG PO TABS
25.0000 mg | ORAL_TABLET | Freq: Two times a day (BID) | ORAL | Status: DC | PRN
  Administered 2011-11-25: 25 mg via ORAL

## 2011-11-24 MED ORDER — MAGNESIUM HYDROXIDE 400 MG/5ML PO SUSP: 30.0000 mL | Freq: Every day | ORAL | Status: DC | PRN

## 2011-11-24 MED ORDER — ACETAMINOPHEN 325 MG PO TABS: 650.0000 mg | ORAL_TABLET | Freq: Four times a day (QID) | ORAL | Status: DC | PRN

## 2011-11-24 MED ORDER — OLANZAPINE 5 MG PO TABS: 10.0000 mg | ORAL_TABLET | Freq: Every day | ORAL | Status: DC

## 2011-11-24 MED FILL — Citalopram Hydrobromide Tab 20 MG (Base Equiv): ORAL | Qty: 1 | Status: AC

## 2011-11-24 MED FILL — Olanzapine Tab 10 MG: ORAL | Qty: 1 | Status: AC

## 2011-11-24 MED FILL — Lorazepam Tab 1 MG: ORAL | Qty: 1 | Status: AC

## 2011-11-24 NOTE — ED Notes (Signed)
Report to donna-transfer to [facility] [phone]

## 2011-11-24 NOTE — Progress Notes (Signed)
PT complaining of anxiety. Called MD and obtained Vistaril order. Pt refused to take medication stating that it causes her to crave crack. Pt states that at ED she received Xanax and Ativan and wanted to see about getting those. Called MD back to get another medication ordered for anxiety. Buspar 10 mg was ordered. Pt hesitant to take medication. She wanted to know if it was the strongest dose. Pt states that she does not understand why she cannot have Xanax and Ativan. Advised her to speak with MD tomorrow.

## 2011-11-24 NOTE — ED Notes (Signed)
Report received-airway intact-no s/s's of distress-resting quietly, will continue to monitor 

## 2011-11-24 NOTE — Progress Notes (Signed)
Psychoeducational Group Note  Date:  11/24/2011 Time:  2000  Group Topic/Focus:  Wrap-Up Group:   The focus of this group is to help patients review their daily goal of treatment and discuss progress on daily workbooks.  Participation Level:  Did Not Attend  Participation Quality:    Affect:    Cognitive:   Insight:    Engagement in Group:    Additional Comments:  pt was sleep. Pt just got here a few hours before.  Isla Pence M 11/24/2011, 9:22 PM
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
## 2011-11-24 NOTE — Tx Team (Signed)
Initial Interdisciplinary Treatment Plan  PATIENT STRENGTHS: (choose at least two) Active sense of humor Physical Health Religious Affiliation  PATIENT STRESSORS: Financial difficulties Marital or family conflict Medication change or noncompliance   PROBLEM LIST: Problem List/Patient Goals Date to be addressed Date deferred Reason deferred Estimated date of resolution  Anxiety  11/24/2011    Discharge  Suicidal Ideation 11/24/2011    Discharge  Depression 11/24/2011    Discharge                                       DISCHARGE CRITERIA:  Ability to meet basic life and health needs Adequate post-discharge living arrangements Improved stabilization in mood, thinking, and/or behavior Motivation to continue treatment in a less acute level of care Need for constant or close observation no longer present Safe-care adequate arrangements made Verbal commitment to aftercare and medication compliance  PRELIMINARY DISCHARGE PLAN: Outpatient therapy Placement in alternative living arrangements  PATIENT/FAMIILY INVOLVEMENT: This treatment plan has been presented to and reviewed with the patient. The patient and family have been given the opportunity to ask questions and make suggestions.  Kathleen [name] 11/24/2011, 4:12 PM

## 2011-11-24 NOTE — [facility] Counselor (Signed)
Patient accepted by Serena Colonel, PA to Dr Harvie Heck. [place]. Room 407-2. EDP Dr. Wyman Songster made aware of patient's disposition and agrees to discharge patient accordingly. Patient's nurse made aware of patient's disposition as well. Writer will complete patient's support paperwork and fax to [facility].
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
## 2011-11-24 NOTE — ED Notes (Signed)
Patient states she is use to "blue Xanax"-states strength given here not working-not controlling her anxiety-will notify MD
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
## 2011-11-25 DIAGNOSIS — F1999 Other psychoactive substance use, unspecified with unspecified psychoactive substance-induced disorder: Principal | ICD-10-CM

## 2011-11-25 DIAGNOSIS — F1410 Cocaine abuse, uncomplicated: Secondary | ICD-10-CM

## 2011-11-25 DIAGNOSIS — F3160 Bipolar disorder, current episode mixed, unspecified: Secondary | ICD-10-CM

## 2011-11-25 MED ORDER — ALBUTEROL SULFATE HFA 108 (90 BASE) MCG/ACT IN AERS
2.0000 | INHALATION_SPRAY | RESPIRATORY_TRACT | Status: DC | PRN
  Administered 2011-11-27: 2 via RESPIRATORY_TRACT
  Filled 2011-11-25: qty 6.7

## 2011-11-25 MED ORDER — INSULIN ASPART 100 UNIT/ML ~~LOC~~ SOLN
2.0000 [IU] | Freq: Every day | SUBCUTANEOUS | Status: DC
  Administered 2011-11-25 – 2011-11-29 (×5): 2 [IU] via SUBCUTANEOUS

## 2011-11-25 MED ORDER — QUETIAPINE FUMARATE 300 MG PO TABS
300.0000 mg | ORAL_TABLET | Freq: Every day | ORAL | Status: DC
  Administered 2011-11-25 – 2011-11-26 (×2): 300 mg via ORAL
  Filled 2011-11-25 (×4): qty 1

## 2011-11-25 MED ORDER — CITALOPRAM HYDROBROMIDE 20 MG PO TABS
20.0000 mg | ORAL_TABLET | Freq: Every day | ORAL | Status: DC
  Administered 2011-11-25 – 2011-11-29 (×5): 20 mg via ORAL
  Filled 2011-11-25 (×8): qty 1
  Filled 2011-11-25: qty 14

## 2011-11-25 MED ORDER — CHLORDIAZEPOXIDE HCL 25 MG PO CAPS
25.0000 mg | ORAL_CAPSULE | Freq: Three times a day (TID) | ORAL | Status: DC | PRN
  Administered 2011-11-25 – 2011-11-29 (×9): 25 mg via ORAL
  Filled 2011-11-25 (×9): qty 1
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
## 2011-11-25 NOTE — Tx Team (Addendum)
Interdisciplinary Treatment Plan Update (Adult)  Date:  11/25/2011  Time Reviewed:  10:24 AM   Progress in Treatment: Attending groups:   Yes   Participating in groups:  Yes Taking medication as prescribed:  Yes Tolerating medication:  Yes Family/Significant othe contact made: Needed still Patient understands diagnosis:  Yes Discussing patient identified problems/goals with staff: Yes Medical problems stabilized or resolved: Yes Denies suicidal/homicidal ideation:Yes Issues/concerns per patient self-inventory: Denies using cocaine in the last week, although UDS is positive for cocaine Other:  New problem(s) identified:  Patient is angry, verbally escalating because she wants to be back on Xanax and will not be prescribed that here.  She started then insisting on immediate discharge, refused to try to let the medications work a few days, refused to make a list of problems she is facing, refused to consider cooperating.  Insists she needs to "go home" today, despite telling staff this morning that she does not have a home to go to.  Reason for Continuation of Hospitalization: Aggression Anxiety Depression Hallucinations Medication stabilization Suicidal ideation Other; describe substance abuse issues - in denial  Interventions implemented related to continuation of hospitalization:  Medication Management; safety checks q 15 mins  Additional comments:  Estimated length of stay:  3-4 days  Discharge Plan:  Unknown at this time, will be explored in the next day or so  New goal(s):  Review of initial/current patient goals per problem list:   1.  Goal(s):Eliminate SI/other thoughts of self harm   Met:  No  Target date:  As evidenced by:  Expresses suicidal thoughts today, but can contract for safety  2.  Goal (s): Eliminate Psychosis  Met:  No  Target date:  As evidenced by:  Still endorses voices  3.  Goal(s):  .stabilize on meds   Met:  No  Target date:  As  evidenced by:  Ongoing medication adjustments needed, patient is resistant to all suggestions made of various kinds of medication  4.  Goal(s):  Reduce anxiety no more than 3 at discharge.  Met:  No  Target date:  As evidenced by:  High anxiety today, complains of panic attacks as symptoms that she experiences  Attendees: Patient:  Kathleen [name] 11/25/2011 10:27 AM  Family:     Physician:  Nelly Rout, MD 11/25/2011 10:24 AM   Nursing:   Neill Loft & Joslyn Devon, RN 11/25/2011 10:24 AM   CaseManager:  Juline Patch, LCSW 11/25/2011 10:24 AM   Counselor:  Angus Palms, LCSW 11/25/2011 10:24 AM   Other:  Ambrose Mantle     11/25/2011 .10:26 AM  Scribe:  Ambrose Mantle, LCSW 11/25/2011, 11:29 AM
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
## 2011-11-25 NOTE — Progress Notes (Addendum)
Patient ID: Kathleen [name], female   DOB: [birth year], 46 y.o.   MRN: [id number] D-Pt anxious this am and asking when she will be restarted on her medications.  She attended am groups and attended treatment team.  When told that xanax would not be restarted she became upset and asked to be discharged. She refused cbg at noon.  A- Pt reminded that she had endorsed suicidal thoughts earlier, but firmly stating she wanted to leave since she wasn't being helped here.  Pt talked with Shaun T, asst-director and Bernerd Limbo,, PA and mood was more receptive to being here and getting help. R- Pt accepted ordered buspar that she had earlier refused.
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
## 2011-11-25 NOTE — Progress Notes (Signed)
D: Has been in bed most of shift to time of writing. Approached writer asking for her HS meds. Appears blunted and depressed. No acute distress noted. States she wants her regular medications back or she is going home in the AM. Refused initial offer of prn because, "It gives me crack cravings.". Denies SI/HI/AVH and contracts for safety.  A: Support and encouragement provided. Safety has been maintained with Q15 minute observation. Reviewed POC and medications for the shift and understanding verbalized. Explained that it is not uncommon for the admitting MD to not order all previous meds. Explained that sometimes the original meds and dosages could be contributing to decompensation and her ateending MD will address that.. Reassured her she would be seen in AM and she can discuss her wishes with her MD. Also explained that even though the prn offered may make her crave crack (by her own proclamation), she is in a place where she would have no access.  R: Pt remains safe. She eventually accepted prn and returned to bed. Will continue Q15 minute observation and continue current POC.

## 2011-11-25 NOTE — Progress Notes (Signed)
Psychoeducational Group Note  Date:  11/25/2011 Time:  2030  Group Topic/Focus:  Wrap-Up Group:   The focus of this group is to help patients review their daily goal of treatment and discuss progress on daily workbooks.  Participation Level:  Active  Participation Quality:  Monopolizing, Redirectable and Sharing  Affect:  Anxious  Cognitive:  Alert  Insight:  Limited  Engagement in Group:  Good  Additional Comments:  Patient attended and participated in wrap-up group this evening. Patient was sharing that today was not a great day and that she was frustrated for doctors switching her medication.  Laytoya Ion, Newton Pigg 11/25/2011, 9:05 PM

## 2011-11-25 NOTE — Progress Notes (Signed)
Psychoeducational Group Note  Date:  11/25/2011 Time:  1100  Group Topic/Focus:  Recovery Goals:   The focus of this group is to identify appropriate goals for recovery and establish a plan to achieve them.  Participation Level:  Active  Participation Quality:  Attentive  Affect:  Appropriate  Cognitive:  Appropriate  Insight:  Good  Engagement in Group:  Good  Additional Comments:   Pt attended group and was assisted in translation of information due to issues with hearing. Pt explained what recovery was and what areas in her life she could make changes towards recovery. Pt set a goal on a way to improve during recovery. Pt also participated in an activity related to writing goals for recovery.  Karleen Hampshire Brittini 11/25/2011, 2:39 PM

## 2011-11-25 NOTE — Progress Notes (Signed)
Patient met with patient to assess for discharge planning needs.  She reports she has been living with her daughter but will live with a friend at discharge.  She is currently denying SI/HI but endorses off/on A/VH.  Patient reports she recently relocated to [facility] from [place] and has had a break up with her husband.  She rates depression at three, anxiety at ten, and hopelessness/helplessness at five. She also endorses paranoia.  Patient will need assist with medications and transportation.

## 2011-11-26 LAB — GLUCOSE, CAPILLARY
Glucose-Capillary: 127 mg/dL — ABNORMAL HIGH (ref 70–99)
Glucose-Capillary: 132 mg/dL — ABNORMAL HIGH (ref 70–99)

## 2011-11-26 LAB — HEMOGLOBIN A1C: Hgb A1c MFr Bld: 5.3 % (ref <5.7)

## 2011-11-26 MED ORDER — TRAZODONE HCL 50 MG PO TABS: 50.0000 mg | ORAL_TABLET | Freq: Every evening | ORAL | Status: DC | PRN

## 2011-11-26 MED ORDER — QUETIAPINE FUMARATE 50 MG PO TABS
350.0000 mg | ORAL_TABLET | Freq: Every day | ORAL | Status: DC
  Filled 2011-11-26: qty 1

## 2011-11-26 MED ORDER — QUETIAPINE FUMARATE 50 MG PO TABS
50.0000 mg | ORAL_TABLET | ORAL | Status: DC
  Administered 2011-11-27: 50 mg via ORAL
  Filled 2011-11-26 (×4): qty 1

## 2011-11-26 NOTE — Progress Notes (Signed)
Patient ID: Kathleen [name], female   DOB: [birth year], 46 y.o.   MRN: [id number] Kathleen [name]  46 y.o.  [id number] 04-26-1966  11/26/2011   Diagnosis:  Cocaine abuse                      Bipolar disorder mixed                      PTSD  Subjective:  Met with patient to discuss her symptoms.  She notes that the librium she is on for potential withdrawal from benzos, is "not enough, I"m too anxious, and can't seem to calm down." She is requesting an increase in the librium or would like to try Haldol as that has worked in the past.  She notes that she did try to sleep last night and does feel better today, but is still anxious.  Vital Signs:Blood pressure 124/73, pulse 97, temperature 97.1 F (36.2 C), temperature source Oral, resp. rate 18, height 5\' 3"  (1.6 m), weight 80.74 kg (178 lb), last menstrual period 11/03/2011.   Objective:   Patient is alert and oriented x 3. Voices symptoms of anxiety as noted above. States she is glad that she is here, as "if I had gone home I would have taken every pill in my house to die."  Her speech is clear and goal directed. There is no AH/VH. She is in full touch with reality. Her mood is depressed and her affect is congruent. She denies SI/HI but complains of increased anxiety and irritability. "Im not myself."  Assessment: Dx unchanged  Medications Scheduled:  . busPIRone  10 mg Oral TID  . citalopram  20 mg Oral Daily  . insulin aspart  2 Units Subcutaneous Daily  . QUEtiapine  350 mg Oral QHS  . QUEtiapine  50 mg Oral [id number]  . DISCONTD: QUEtiapine  300 mg Oral QHS     PRN Meds acetaminophen, albuterol, alum & mag hydroxide-simeth, chlordiazePOXIDE, hydrOXYzine, magnesium hydroxide, traZODone, DISCONTD: traZODone  Plan: 1. Discussed treatment plan with patient and offered options for her:     Increase seroquel in total dose with small doses through the day to decrease anxiety.     Switch her to Haldol as she requested.     Increase her  Buspar. 2. After much discussion the patient elected to go with increased seroquel as noted. 3. She will continue on the [address] until she is more stable. 4. She will be able to program with AA on [address] until moved over to the [address]. [name]. Avonell Lenig PAC            Manisha Cancel T. Aimie Wagman [facility] 11/26/2011 11:19 PM

## 2011-11-26 NOTE — Progress Notes (Signed)
Psychoeducational Group Note  Date:  11/26/2011 Time:  1100  Group Topic/Focus:  Crisis Planning:   The purpose of this group is to help patients create a crisis plan for use upon discharge or in the future, as needed.  Participation Level:  Active  Participation Quality:  Appropriate, Sharing and Supportive  Affect:  Appropriate  Cognitive:  Appropriate  Insight:  Good  Engagement in Group:  Good  Additional Comments: none  Waleed Dettman M 11/26/2011, 1:49 PM

## 2011-11-26 NOTE — Progress Notes (Signed)
D: Patient denies SI but says she is currently experiencing auditory hallucinations. Patient seems to have a anxious mood and affect as evidenced by her restless motor activity and verbal complaints (patient rates anxiety a 9 out of 10; 1 low/10high). Patient reports sleeping fairly and having a normal appetite and energy level. A: Patient given emotional support from RN. Patient encouraged to come to staff with concerns and/or questions. Patient's medication routine continued. Patient's orders and plan of care reviewed. RN notified MD of patient's anxiety and pt's request for a new anxiety medication.  R: Patient remains appropriate and cooperative. Patient is still anxious and is pacing intermittently in her room. No new orders were given from MD. RN and MD will meet with patient later today about patient's anxiety. Will continue to monitor patient q15 minutes for safety.

## 2011-11-26 NOTE — Progress Notes (Signed)
D.  Pt. Has a flat affect, anxious mood.  Requested bed time medications and information on what time her medications would be given. A.  Pt. Told what time her bed time  medications could be given  And what they were. Support given. R.  Pt.  Aware of what time bed time medications are given.  Pt. Receptive to information.

## 2011-11-26 NOTE — Progress Notes (Signed)
11/26/2011         Time: 0930      Group Topic/Focus: The focus of the group is on enhancing the patients' ability to utilize positive relaxation strategies by practicing several that can be used at discharge.  Participation Level: Did not attend  Participation Quality: Not Applicable  Affect: Not Applicable  Cognitive: Not Applicable   Additional Comments: Patient asleep.   Tali Coster 11/26/2011 11:51 AM

## 2011-11-26 NOTE — Progress Notes (Signed)
Psychoeducational Group Note  Date:  11/26/2011 Time:  2000  Group Topic/Focus:  Wrap-Up Group:   The focus of this group is to help patients review their daily goal of treatment and discuss progress on daily workbooks.  Participation Level: Did Not Attend  Participation Quality:  Not Applicable  Affect:  Not Applicable  Cognitive:  Not Applicable  Insight:  Not Applicable  Engagement in Group: Not Applicable  Additional Comments:  Pt was upset because she could not get a snack before wrap-up group, and stated that she was not coming to group because of that.  Kaleen Odea R 11/26/2011, 9:22 PM

## 2011-11-26 NOTE — Progress Notes (Signed)
[facility] Group Notes:  (Counselor/Nursing/MHT/Case Management/Adjunct)  11/26/2011 5:14 PM  Type of Therapy:  Group Therapy 1:15 to 2:30 PM  Participation Level:  Minimal  Participation Quality:  Intrusive, Redirectable and Sharing  Affect:  Appropriate and later Blunted; noted by others in group Cognitive:  Oriented  Insight:  Limited  Engagement in Group:  Limited  Engagement in Therapy:  None  Modes of Intervention:  Clarification, Limit-setting and Socialization  Summary of Progress/Problems:  Sandi joined from another hall to participate in 1:15 group shared with others that she relapsed within 30 minutes of return to [place] on her drug of choice after disagreement with family member out of town. Amaani's other sharing was intrusive and not related to focus of this group session, which was to process how we deal with difficult emotions. Jenice was responsive to redirection yet there was a definite shift in mood and affect from open to closed.      Clide Dales 11/26/2011, 5:21 PM

## 2011-11-26 NOTE — Discharge Planning (Signed)
Patient only seen briefly in dayroom today, as she did not attend Aftercare Planning Group.  She was on the telephone with her attorney, apparently, and asked Case Manager's last name and phone number, and said her attorney will be calling.  No other known case management needs today.  Ambrose Mantle, LCSW 11/26/2011, 1:25 PM

## 2011-11-26 NOTE — Progress Notes (Signed)
[facility] Group Notes:  (Counselor/Nursing/MHT/Case Management/Adjunct)  11/26/2011 9:51 AM  Type of Therapy:  Group Therapy  11/25/11  Participation Level:  Active  Participation Quality:  Attentive and Supportive  Affect:  Anxious  Cognitive:  Oriented  Insight:  Good  Engagement in Group:  Good  Engagement in Therapy:  Good  Modes of Intervention:  Clarification and Education, support   Summary of Progress/Problems: Patient was a good resource for other peers in suggesting that peer consult with his housing case manager about staying in [address] but requesting it not be around drugs. Patient also gave feedback to another peer about [facility]. Patient talked negatively about the program stating that she stayed 21 days but left due to it being co-ed, they didn't want to work with her diabetes, etc.    Dallis Darden, Aram Beecham 11/26/2011, 9:51 AM

## 2011-11-26 NOTE — H&P (Signed)
Agree with the above

## 2011-11-27 DIAGNOSIS — F4310 Post-traumatic stress disorder, unspecified: Secondary | ICD-10-CM

## 2011-11-27 LAB — GLUCOSE, CAPILLARY
Glucose-Capillary: 135 mg/dL — ABNORMAL HIGH (ref 70–99)
Glucose-Capillary: 155 mg/dL — ABNORMAL HIGH (ref 70–99)

## 2011-11-27 MED ORDER — HALOPERIDOL 5 MG PO TABS
10.0000 mg | ORAL_TABLET | Freq: Every day | ORAL | Status: DC
  Administered 2011-11-27 – 2011-11-28 (×2): 10 mg via ORAL
  Filled 2011-11-27: qty 28
  Filled 2011-11-27 (×3): qty 2

## 2011-11-27 NOTE — Progress Notes (Signed)
Currently resting quietly in bed with eyes closed. Respirations are even and unlabored. No acute distress noted. Safety has been maintained with Q15 minute observation. Will continue to current POC. 

## 2011-11-27 NOTE — Progress Notes (Signed)
  D) Patient pleasant and cooperative upon my assessment. Patient states slept "well," and  appetite is "good." Patient rates depression as   3/10, patient rates hopeless feelings as 3 /10. Patient denies SI/HI, denies A/V hallucinations.   A) Patient offered support and encouragement, patient encouraged to discuss feelings/concerns with staff. Patient verbalized understanding. Patient monitored Q15 minutes for safety. Patient met with MD to discuss today's goals and plan of care.  R) Patient active on unit, attending groups in day room and meals in dining room. Patient has a plan to "go back to work," and take her mind off "bad things"once she is discharged from [facility].  Patient taking medications as ordered. Will continue to monitor.

## 2011-11-27 NOTE — Discharge Planning (Signed)
Aftercare Planning Group  Date:  11/27/2011  Time:  8:30AM  Narrative:  Met with patient in Aftercare Planning Group and provided today's workbook based on theme of the day.  Patient stated she is ready to discharge today.  Later in the morning she spoke with the doctor and her medications will be changed due to affordability, thus she will stay 2 more days, and she told Case Manager she is fine with this.  Patient states she will stay with a friend and go to [place].  She would like a taxi voucher to take her home.  Case Manager believes the bus will go where she will be living, will research this.    Affect:  Broad  Insight:  Fair  Judgment:  Good  Problems identified:  Needs more affordable meds.  Wants a taxi to go home.  Needs a [facility] appointment.  Request(s) made to Case Manager:   Wants a taxi voucher.  Needs a [facility] appointment.  Still needed to finalize discharge plans:  Figure out whether can go home by bus, and if not, how else can she go home.  Set a [facility] appointment.   Ambrose Mantle, LCSW 11/27/2011 1:11 PM

## 2011-11-27 NOTE — Progress Notes (Signed)
Patient ID: Kathleen [name], female   DOB: 08-14-1965, 46 y.o.   MRN: [id number]  11/27/2011   Diagnosis:  Cocaine abuse                      Bipolar disorder mixed                      PTSD  Subjective:  Met with patient , patient reports improvement in her mood but adds that she cannot afford the Seroquel. Discussed changing him to Haldol from Seroquel and see how she does in regards to her mood and psychosis. Patient denies any hallucinations, paranoia. She adds that her mood is much better and she feels that she should be able to go home in the next day or 2  Vital Signs:Blood pressure 131/77, pulse 108, temperature 98 F (36.7 C), temperature source Oral, resp. rate 16, height 5\' 3"  (1.6 m), weight 178 lb (80.74 kg), last menstrual period 11/03/2011.   Objective: Patient is alert and oriented x3. She feels that being here has helped her mood. She rates her depression as a 1/10 and anxiety is 2/10    . busPIRone  10 mg Oral TID  . citalopram  20 mg Oral Daily  . haloperidol  10 mg Oral QHS  . insulin aspart  2 Units Subcutaneous Daily  . DISCONTD: QUEtiapine  300 mg Oral QHS  . DISCONTD: QUEtiapine  350 mg Oral QHS  . DISCONTD: QUEtiapine  50 mg Oral [id number]      PRN Meds acetaminophen, albuterol, alum & mag hydroxide-simeth, chlordiazePOXIDE, hydrOXYzine, magnesium hydroxide, traZODone, DISCONTD: traZODone  Review of systems: Patient denies any difficulty with gait or gaze. She denies any shortness of breath, palpitation, chest pain. She denies any abdominal pain, nausea, vomiting or diarrhea. She denies any dysuria  Plan: Continue Celexa 20 mg 1 in the morning for depression Discontinue Seroquel as patient cannot afford it To start Haldol 10 mg one pill at bedtime for mood stabilization and psychosis. The risks and benefits along with the side effects were discussed with the patient and she was agreeable with this plan Continue BuSpar 10 mg 3 times a day for anxiety Continue  Vistaril,Trazadone as needed Patient to participate in groups Discharge planning is for  this weekend if patient does well on the Haldol.             11/27/2011 1:04 PM

## 2011-11-27 NOTE — Progress Notes (Signed)
[facility] Group Notes:  (Counselor/Nursing/MHT/Case Management/Adjunct)  11/27/2011 2:22 PM  Type of Therapy:  Group Therapy 9:30am  Participation Level:  Did Not Attend   Summary of Progress/Problems: Kathleen [name] did not attend group.   Clarice Pole C 11/27/2011, 2:22 PM

## 2011-11-28 DIAGNOSIS — F319 Bipolar disorder, unspecified: Secondary | ICD-10-CM

## 2011-11-28 LAB — GLUCOSE, CAPILLARY: Glucose-Capillary: 134 mg/dL — ABNORMAL HIGH (ref 70–99)

## 2011-11-28 NOTE — Progress Notes (Signed)
11/28/2011      Time: 0930      Group Topic/Focus: The focus of this group is on discussing various styles of communication and communicating assertively using 'I' (feeling) statements.  Participation Level: Active  Participation Quality: Attentive  Affect: Appropriate  Cognitive: Alert   Additional Comments: Patient bright, reports "I know I am ready" for discharge today.  Nena Hampe 11/28/2011 10:12 AM

## 2011-11-28 NOTE — Tx Team (Signed)
Interdisciplinary Treatment Plan Update (Adult)  Date:  11/28/2011  Time Reviewed:  10:15AM-11:15AM  Progress in Treatment: Attending groups:  Yes Participating in groups:    Yes Taking medication as prescribed:    Yes Tolerating medication:   Yes Family/Significant other contact made:  No, refuses Patient understands diagnosis:   Yes Discussing patient identified problems/goals with staff:   Yes Medical problems stabilized or resolved:   Yes Denies suicidal/homicidal ideation:  Yes Issues/concerns per patient self-inventory:   None Other:    New problem(s) identified: No, Describe:    Reason for Continuation of Hospitalization: Medication stabilization  Interventions implemented related to continuation of hospitalization:  Medication monitoring and adjustment, safety checks Q15 min., suicide risk assessment, group therapy, psychoeducation, collateral contact, aftercare planning, ongoing physician assessments, medication education  Additional comments:  Not applicable  Estimated length of stay:  1 day  Discharge Plan:  Return to live with friend, follow up with [facility].  New goal(s):  Not applicable  Review of initial/current patient goals per problem list:   1.  Goal(s):  Eliminate SI/other thoughts of self harm  Met:  Yes  Target date:  By Discharge   As evidenced by:  Has been denying  2.  Goal(s):  Eliminate Psychosis  Met:  Yes  Target date:  By Discharge   As evidenced by:  Denies voices  3.  Goal(s):  Medication stabilization  Met:  No  Target date:  By Discharge   As evidenced by:  Just started Haldol yesterday  4.  Goal(s):  Reduce anxiety no more than 3 at discharge.  Met:  Yes  Target date:  By Discharge   As evidenced by:  Stable for discharge     Attendees: Patient:  Did not attend   Family:     Physician:   11/28/2011 10:15AM-11:15AM  Nursing:   Nestor Ramp, RN 11/28/2011 10:15AM -11:15AM   Case Manager:  Ambrose Mantle, LCSW  11/28/2011 10:15AM-11:15AM  Counselor:  Veto Kemps, MT-BC 11/28/2011 10:15AM-11:15AM  Other:   Verne Spurr, PA 11/28/2011   Other:      Other:      Other:       Scribe for Treatment Team:   Sarina Ser, 11/28/2011, 10:15AM-11:15AM

## 2011-11-28 NOTE — Progress Notes (Signed)
[facility] Group Notes:  (Counselor/Nursing/MHT/Case Management/Adjunct)  11/28/2011 9:32 PM  Type of Therapy:  Wrap up  Participation Level:  Active  Participation Quality:  Appropriate  Affect:  Appropriate  Cognitive:  Appropriate  Insight:  Good  Engagement in Group:  Good  Engagement in Therapy:  Good  Modes of Intervention:  Education and Support  Summary of Progress/Problems: Kripa was active in group tonight. Lorean stated that her goal tomorrow is to find out what meds are going home with her. Sarabella said that she really likes the Lithium and would like it to go home with her. She said her day has been good and that she has been laughing all day due to another patient. She said her goal for the day was to stay awake and positive which she felt like she accomplished.   Nichola Sizer 11/28/2011, 9:32 PMThe focus of this group is to help patients review their daily goal of treatment and discuss progress on daily workbooks.

## 2011-11-28 NOTE — Progress Notes (Signed)
Inpatient Diabetes Program Recommendations  AACE/ADA: New Consensus Statement on Inpatient Glycemic Control (2009)  Target Ranges:  Prepandial:   less than 140 mg/dL      Peak postprandial:   less than 180 mg/dL (1-2 hours)      Critically ill patients:  140 - 180 mg/dL   Reason for Visit: Hyperglycemia  This is a voluntary admission for [name] who reported to the [facility] reporting increasing depression with suicidal ideation after having been off her medication for 3 days and relapsing on crack cocaine 2 days prior. Kathleen [name] is well known  as she was last with [place] in February of this year. The patient reports she was doing well after her discharge in February. 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 She was living with her daughter who had a very active party lifestyle the 2 had a falling out and care left to return to [place].   Results for BRYNA, RAZAVI (MRN [id number]) as of 11/28/2011 16:27  Ref. Range 11/27/2011 06:38 11/27/2011 12:14 11/27/2011 16:28 11/28/2011 06:16 11/28/2011 12:11  Glucose-Capillary Latest Range: 70-99 mg/dL 469 (H) 629 (H) 528 (H) 114 (H) 127 (H)   Results for MANSI, TOKAR (MRN [id number]) as of 11/28/2011 16:27  Ref. Range 11/25/2011 15:53  Hemoglobin A1C Latest Range: <5.7 % 5.3   Inpatient Diabetes Program Recommendations Correction (SSI): Add Novolog moderate tidwc  Note: On meal coverage insulin at home.  Needs correction insulin along with ordered meal coverage.  Will follow.

## 2011-11-28 NOTE — Progress Notes (Signed)
D) pt. Talking loudly on phone and pleasant when interacting with staff.  Appropriately asked questions about d/c.  Denied A/V hallucinations and denied SI/HI.  No c/o pain.  A) nutritional guidelines offered re snack choices due to high CBG.  Support offered re: d/c plans.  R) pt. Receptive to treatment and cooperative with restrictions of sweet snack foods. Showed concern about diabetes.

## 2011-11-28 NOTE — Discharge Planning (Signed)
Aftercare Planning Group   -  11/28/2011 @ 8:30AM  Narrative:  Met with patient in Aftercare Planning Group and provided today's workbook based on theme of the day.  Patient stated she knows that she is supposed to stay until Saturday, but "I feel great, and I need to meet with my attorney, and I need to go today."  Case Manager reminded her of why we wanted to keep her two days for observation after starting a new med, and she was adamant that she would be fine and needs to go home.   Case Manager set appointment for [facility] follow-up on 7/30 at 3:15pm with Clerance Lav.  Affect:   Broad                  Insight:    Fair                 Judgment:  Fair  Problems identified:  Patient insistent on immediate discharge  Request(s) made to Case Manager:  Discharge  Still needed to finalize discharge plans:  Sign Release to [facility].  Ambrose Mantle, LCSW 11/28/2011 1:05 PM

## 2011-11-28 NOTE — Progress Notes (Signed)
[facility] Group Notes:  (Counselor/Nursing/MHT/Case Management/Adjunct)  11/28/2011 3:17 PM  Type of Therapy:  Group Therapy  Participation Level:  Did Not Attend   Veto Kemps 11/28/2011, 3:17 PM

## 2011-11-28 NOTE — Progress Notes (Signed)
[facility] Group Notes:  (Counselor/Nursing/MHT/Case Management/Adjunct)  11/28/2011 3:15 PM  Type of Therapy:  Group Therapy 11/27/11  Participation Level:  Did Not Attend   Kathleen [name] 11/28/2011, 3:15 PM

## 2011-11-28 NOTE — Progress Notes (Signed)
D: Patient denies SI/HI and auditory/visual hallucinations. Patient has an anxious mood with an appropriate affect. Patient states she is nervous because she has wants to be discharged in order to "meet with her lawyer" about some social security issues. Mercy also become irritable when told that she probably won't be discharged today and said she's "not getting anything anymore from being here". Patient rates her depression and hopelessness a 1 out of 10 (1 low/10 high). Patient reports sleeping well and having a good appetite and energy level.  A: Patient given emotional support from RN and given education regarding discharge protocol. Patient encouraged to come to staff with concerns and/or questions. Patient's medication routine continued. Patient's orders and plan of care reviewed. Case management is aware of patient's legal issues.  R: Patient remains appropriate and cooperative but somewhat irritable. Patient continues to want to be discharged and feels like staff are treating her "like a kid" because of the structured times for unit activities. Will continue to monitor patient q15 minutes for safety.

## 2011-11-28 NOTE — Progress Notes (Signed)
D   Pt complained of anxiety and irritability   She reports being upset over being taken off the xanax   She attended group and is cooperative  With treatment   She can be loud and intrusive at times but responds well to redirection  A   Discussed her medications with pt and informed her the doctor was taking her off xanax and was giving he librium to prevent any withdrawal   Verbal support given   Medications administered and effectiveness monitored  Q 15 min checks R   Pt safe at present time

## 2011-11-28 NOTE — Progress Notes (Signed)
Psychoeducational Group Note  Date:  11/27/2011 Time:  1100  Group Topic/Focus:  Overcoming Stress:   The focus of this group is to define stress and help patients assess their triggers.  Participation Level: Did Not Attend  Participation Quality:  Not Applicable  Affect:  Not Applicable  Cognitive:  Not Applicable  Insight:  Not Applicable  Engagement in Group: Not Applicable  Additional Comments:  Pt did not attend group as instructed. In bed asleep.   Slyvester Latona A 11/28/2011, 1:27 AM

## 2011-11-29 LAB — GLUCOSE, CAPILLARY
Glucose-Capillary: 102 mg/dL — ABNORMAL HIGH (ref 70–99)
Glucose-Capillary: 85 mg/dL (ref 70–99)

## 2011-11-29 MED ORDER — INSULIN ASPART 100 UNIT/ML ~~LOC~~ SOLN: 2.0000 [IU] | Freq: Every day | SUBCUTANEOUS | Status: DC

## 2011-11-29 MED ORDER — BUSPIRONE HCL 10 MG PO TABS: 10.0000 mg | ORAL_TABLET | Freq: Three times a day (TID) | ORAL | Status: DC

## 2011-11-29 MED ORDER — CITALOPRAM HYDROBROMIDE 20 MG PO TABS: 20.0000 mg | ORAL_TABLET | Freq: Every day | ORAL | Status: DC

## 2011-11-29 MED ORDER — HALOPERIDOL 10 MG PO TABS: 10.0000 mg | ORAL_TABLET | Freq: Every day | ORAL | Status: DC

## 2011-11-29 NOTE — Progress Notes (Signed)
Psychoeducational Group Note  Date:  11/29/2011 Time:  0945 am  Group Topic/Focus:  Identifying Needs:   The focus of this group is to help patients identify their personal needs that have been historically problematic and identify healthy behaviors to address their needs.  Participation Level:  Active  Participation Quality:  Appropriate  Affect:  Appropriate  Cognitive:  Alert  Insight:  Good  Engagement in Group:  Good  Additional Comments:    Andrena Mews 11/29/2011,10:41 AM
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
## 2011-11-29 NOTE — Progress Notes (Signed)
[facility] Adult Inpatient Family/Significant Other Suicide Prevention Education  Suicide Prevention Education:  Patient Refusal for Family/Significant Other Suicide Prevention Education: The patient Kathleen [name] has refused to provide written consent for family/significant other to be provided Family/Significant Other Suicide Prevention Education during admission and/or prior to discharge.  Physician notified.  Lamar Blinks [place] 11/29/2011, 12:33 PM

## 2011-11-29 NOTE — Progress Notes (Signed)
[facility] Case Management Discharge Plan:  Will you be returning to the same living situation after discharge: No. Pt. Will be living with a friend At discharge, do you have transportation home?:Yes,  Bus pass and map in chart Do you have the ability to pay for your medications: yes  Interagency Information:     Release of information consent forms completed and in the chart;  Patient's signature needed at discharge.  Patient to Follow up at:  Follow-up Information    Follow up with Clerance Lav, PA on 12/02/2011. (3:15 PM appointment; please arrive at 3:00 PM to do paperwork)    Contact information:   [facility] [address] [place] Telephone:  [phone]         Patient denies SI/HI:   Yes,      Safety Planning and Suicide Prevention discussed:  Yes,    Barrier to discharge identified:No.  Summary and Recommendations: Pt. Will follow up with appointments. Pt. Will attend AA meetings and was given [facility] Prevention information and agreed to use it if needed. Pt. Will share SI information with boyfriend and refused contact for him.   Lamar Blinks [place] 11/29/2011, 11:35 AM

## 2011-11-29 NOTE — Progress Notes (Signed)
Patient ID: Kathleen [name], female   DOB: 29-Jan-1966, 46 y.o.   MRN: [id number] 11-29-11 @ 1252. Pt stated she was ready for d/c. She got her letter, sample medication and d/c instructions. She stated she understood her d/c instructions. She denied any si/hi/av. Pt was escorted to the lobby. Her mother was waiting in the lobby to transport the pt home.

## 2011-11-29 NOTE — Progress Notes (Signed)
Patient ID: Kathleen [name], female   DOB: [birth year], 46 y.o.   MRN: [id number] Pt. Did not attend. In room sleeping

## 2011-11-29 NOTE — Progress Notes (Signed)
[facility] MD Progress Note  11/29/2011 1:33 AM  Diagnosis:  Bipolar disorder                      Cocaine abuse                     PTSD ADL's:  Intact  Sleep: Good  Appetite:  Good  Suicidal Ideation: Pt. Denies SI, plan, intent or means Homicidal Ideation: Pt. Denies HI, plan, intent or means  Subjective: With patient to discuss her symptoms. She reports she is anxious to discharge, slept like a baby, and that her appetite is good. Patient states she has no depression, no suicidal ideation, no homicidal ideation, no auditory or visual hallucinations. She states she is anxious and yet happy to be leaving in the morning. She notes that the change from Seroquel to Haldol did well for her and she reports no side effects.  Mental Status Examination/Evaluation: Objective:  Appearance: Casual  Eye Contact::  Good  Speech:  Clear and Coherent and Normal Rate  Volume:  Normal  Mood:  Euthymic and Irritable  Affect:  Congruent  Thought Process:  Coherent  Orientation:  Full  Thought Content:  WDL  Suicidal Thoughts:  No  Homicidal Thoughts:  No  Memory:  Immediate;   Good Recent;   Good Remote;   Good  Judgement:  Intact  Insight:  Present  Psychomotor Activity:  Normal  Concentration:  Good  Recall:  Good  Akathisia:  No  Handed:  Right  AIMS (if indicated):     Assets:  Communication Skills  Sleep:  Number of Hours: 6.5    Vital Signs:Blood pressure 121/83, pulse 108, temperature 98.5 F (36.9 C), temperature source Oral, resp. rate 16, height 5\' 3"  (1.6 m), weight 80.74 kg (178 lb), last menstrual period 11/03/2011. Current Medications: Current Facility-Administered Medications  Medication Dose Route Frequency Provider Last Rate Last Dose  . acetaminophen (TYLENOL) tablet 650 mg  650 mg Oral Q6H PRN Mickeal Skinner, MD      . albuterol (PROVENTIL HFA;VENTOLIN HFA) 108 (90 BASE) MCG/ACT inhaler 2 puff  2 puff Inhalation Q4H PRN Verne Spurr, PA-C   2 puff at 11/27/11 0941  .  alum & mag hydroxide-simeth (MAALOX/MYLANTA) 200-200-20 MG/5ML suspension 30 mL  30 mL Oral Q4H PRN Mickeal Skinner, MD      . busPIRone (BUSPAR) tablet 10 mg  10 mg Oral TID Mickeal Skinner, MD   10 mg at 11/28/11 1724  . chlordiazePOXIDE (LIBRIUM) capsule 25 mg  25 mg Oral TID PRN Verne Spurr, PA-C   25 mg at 11/28/11 2144  . citalopram (CELEXA) tablet 20 mg  20 mg Oral Daily Verne Spurr, PA-C   20 mg at 11/28/11 0804  . haloperidol (HALDOL) tablet 10 mg  10 mg Oral QHS Nelly Rout, MD   10 mg at 11/28/11 2142  . hydrOXYzine (ATARAX/VISTARIL) tablet 25 mg  25 mg Oral BID PRN Mickeal Skinner, MD   25 mg at 11/25/11 0055  . insulin aspart (novoLOG) injection 2 Units  2 Units Subcutaneous Daily Verne Spurr, PA-C   2 Units at 11/28/11 0804  . magnesium hydroxide (MILK OF MAGNESIA) suspension 30 mL  30 mL Oral Daily PRN Mickeal Skinner, MD      . traZODone (DESYREL) tablet 50 mg  50 mg Oral QHS PRN Verne Spurr, PA-C        Lab Results:  Results for orders placed during the hospital encounter  of 11/24/11 (from the past 48 hour(s))  GLUCOSE, CAPILLARY     Status: Abnormal   Collection Time   11/27/11  6:38 AM      Component Value Range Comment   Glucose-Capillary 135 (*) 70 - 99 mg/dL   GLUCOSE, CAPILLARY     Status: Abnormal   Collection Time   11/27/11 12:14 PM      Component Value Range Comment   Glucose-Capillary 155 (*) 70 - 99 mg/dL    Comment 1 Notify RN     GLUCOSE, CAPILLARY     Status: Abnormal   Collection Time   11/27/11  4:28 PM      Component Value Range Comment   Glucose-Capillary 228 (*) 70 - 99 mg/dL   GLUCOSE, CAPILLARY     Status: Abnormal   Collection Time   11/27/11  6:21 PM      Component Value Range Comment   Glucose-Capillary 228 (*) 70 - 99 mg/dL   GLUCOSE, CAPILLARY     Status: Abnormal   Collection Time   11/28/11  6:16 AM      Component Value Range Comment   Glucose-Capillary 114 (*) 70 - 99 mg/dL   GLUCOSE, CAPILLARY     Status: Abnormal   Collection  Time   11/28/11 12:11 PM      Component Value Range Comment   Glucose-Capillary 127 (*) 70 - 99 mg/dL    Comment 1 Notify RN     GLUCOSE, CAPILLARY     Status: Abnormal   Collection Time   11/28/11  4:54 PM      Component Value Range Comment   Glucose-Capillary 134 (*) 70 - 99 mg/dL   GLUCOSE, CAPILLARY     Status: Abnormal   Collection Time   11/28/11  8:49 PM      Component Value Range Comment   Glucose-Capillary 147 (*) 70 - 99 mg/dL     Physical Findings: AIMS: Facial and Oral Movements Muscles of Facial Expression: None, normal Lips and Perioral Area: None, normal Jaw: None, normal Tongue: None, normal,Extremity Movements Upper (arms, wrists, hands, fingers): None, normal Lower (legs, knees, ankles, toes): None, normal, Trunk Movements Neck, shoulders, hips: None, normal, Overall Severity Severity of abnormal movements (highest score from questions above): None, normal Incapacitation due to abnormal movements: None, normal Patient's awareness of abnormal movements (rate only patient's report): No Awareness, Dental Status Current problems with teeth and/or dentures?: No Does patient usually wear dentures?: No  CIWA:  CIWA-Ar Total: 1  COWS:  COWS Total Score: 0   Treatment Plan Summary: Daily contact with patient to assess and evaluate symptoms and progress in treatment Medication management  Plan: 1. Patient will plan to discharge and out in the morning. 2. Continue current plan of action without changes.  Berdella Bacot 11/29/2011, 1:33 AM

## 2011-11-29 NOTE — Progress Notes (Signed)
Patient ID: Kathleen [name], female   DOB: 1965-11-16, 46 y.o.   MRN: [id number] Pt. Denies lethality and A/V/H's or other issues tonight.  Pt's mood and affect are congruent and fairly bright and animated. Pt. Attended AA group and is appropriate with staff and peers. 07:00--Pt. went to breakfast after VS taken.

## 2011-12-01 NOTE — Progress Notes (Signed)
Patient Discharge Instructions:  After Visit Summary (AVS):   Faxed to:  12/01/2011 Psychiatric Admission Assessment Note:   Faxed to:  12/01/2011 Suicide Risk Assessment - Discharge Assessment:   Faxed to:  12/01/2011 Faxed/Sent to the Next Level Care provider:  12/01/2011  Faxed to [facility] @ [phone]  Wandra Scot, 12/01/2011, 6:09 PM
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
## 2012-01-29 ENCOUNTER — Encounter (HOSPITAL_COMMUNITY): Payer: Self-pay | Admitting: Emergency Medicine

## 2012-01-29 ENCOUNTER — Emergency Department (HOSPITAL_COMMUNITY)
Admission: EM | Admit: 2012-01-29 | Discharge: 2012-01-30 | Disposition: A | Discharge status: Home / Self Care | Payer: Self-pay | Attending: Emergency Medicine | Admitting: Emergency Medicine

## 2012-01-29 DIAGNOSIS — R45851 Suicidal ideations: Secondary | ICD-10-CM | POA: Insufficient documentation

## 2012-01-29 DIAGNOSIS — F3289 Other specified depressive episodes: Secondary | ICD-10-CM | POA: Insufficient documentation

## 2012-01-29 DIAGNOSIS — F329 Major depressive disorder, single episode, unspecified: Secondary | ICD-10-CM

## 2012-01-29 DIAGNOSIS — F1999 Other psychoactive substance use, unspecified with unspecified psychoactive substance-induced disorder: Secondary | ICD-10-CM

## 2012-01-29 DIAGNOSIS — F3160 Bipolar disorder, current episode mixed, unspecified: Secondary | ICD-10-CM

## 2012-01-29 DIAGNOSIS — R443 Hallucinations, unspecified: Secondary | ICD-10-CM | POA: Insufficient documentation

## 2012-01-29 DIAGNOSIS — Z8659 Personal history of other mental and behavioral disorders: Secondary | ICD-10-CM | POA: Insufficient documentation

## 2012-01-29 DIAGNOSIS — F32A Depression, unspecified: Secondary | ICD-10-CM

## 2012-01-29 DIAGNOSIS — Z794 Long term (current) use of insulin: Secondary | ICD-10-CM | POA: Insufficient documentation

## 2012-01-29 DIAGNOSIS — E119 Type 2 diabetes mellitus without complications: Secondary | ICD-10-CM | POA: Insufficient documentation

## 2012-01-29 LAB — CBC WITH DIFFERENTIAL/PLATELET
Basophils Absolute: 0.1 10*3/uL (ref 0.0–0.1)
Basophils Relative: 1 % (ref 0–1)
Eosinophils Absolute: 0.1 K/uL (ref 0.0–0.7)
Eosinophils Relative: 1 % (ref 0–5)
HCT: 38.2 % (ref 36.0–46.0)
Hemoglobin: 13.7 g/dL (ref 12.0–15.0)
Lymphocytes Relative: 20 % (ref 12–46)
Lymphs Abs: 1.5 K/uL (ref 0.7–4.0)
MCH: 25.4 pg — ABNORMAL LOW (ref 26.0–34.0)
MCHC: 35.9 g/dL (ref 30.0–36.0)
MCV: 70.7 fL — ABNORMAL LOW (ref 78.0–100.0)
Monocytes Absolute: 0.6 10*3/uL (ref 0.1–1.0)
Monocytes Relative: 7 % (ref 3–12)
Neutro Abs: 5.5 10*3/uL (ref 1.7–7.7)
Neutrophils Relative %: 71 % (ref 43–77)
Platelets: 238 K/uL (ref 150–400)
RBC: 5.4 MIL/uL — ABNORMAL HIGH (ref 3.87–5.11)
RDW: 15.4 % (ref 11.5–15.5)
WBC: 7.7 10*3/uL (ref 4.0–10.5)

## 2012-01-29 LAB — ACETAMINOPHEN LEVEL: Acetaminophen (Tylenol), Serum: <15 ug/mL (ref 10–30)

## 2012-01-29 LAB — COMPREHENSIVE METABOLIC PANEL
ALT: 13 U/L (ref 0–35)
AST: 14 U/L (ref 0–37)
Alkaline Phosphatase: 55 U/L (ref 39–117)
CO2: 24 mEq/L (ref 19–32)
Chloride: 99 mEq/L (ref 96–112)
Creatinine, Ser: 0.91 mg/dL (ref 0.50–1.10)
GFR calc non Af Amer: 75 mL/min — ABNORMAL LOW (ref >90)
Potassium: 3.6 mEq/L (ref 3.5–5.1)
Total Bilirubin: 0.4 mg/dL (ref 0.3–1.2)

## 2012-01-29 LAB — COMPREHENSIVE METABOLIC PANEL WITH GFR
Albumin: 4 g/dL (ref 3.5–5.2)
BUN: 12 mg/dL (ref 6–23)
Calcium: 9.5 mg/dL (ref 8.4–10.5)
GFR calc Af Amer: 87 mL/min — ABNORMAL LOW (ref >90)
Glucose, Bld: 94 mg/dL (ref 70–99)
Sodium: 133 meq/L — ABNORMAL LOW (ref 135–145)
Total Protein: 7.6 g/dL (ref 6.0–8.3)

## 2012-01-29 LAB — SALICYLATE LEVEL: Salicylate Lvl: <2 mg/dL — ABNORMAL LOW (ref 2.8–20.0)

## 2012-01-29 LAB — ETHANOL: Alcohol, Ethyl (B): <11 mg/dL (ref 0–11)

## 2012-01-29 MED ORDER — BUSPIRONE HCL 10 MG PO TABS: 10.0000 mg | ORAL_TABLET | Freq: Two times a day (BID) | ORAL | Status: DC

## 2012-01-29 MED ORDER — BUSPIRONE HCL 10 MG PO TABS: 10.0000 mg | ORAL_TABLET | Freq: Three times a day (TID) | ORAL | Status: DC

## 2012-01-29 MED ORDER — QUETIAPINE FUMARATE ER 200 MG PO TB24
200.0000 mg | ORAL_TABLET | Freq: Every day | ORAL | Status: DC
  Administered 2012-01-29: 200 mg via ORAL
  Filled 2012-01-29 (×3): qty 1

## 2012-01-29 MED ORDER — ONDANSETRON HCL 4 MG PO TABS: 4.0000 mg | ORAL_TABLET | Freq: Three times a day (TID) | ORAL | Status: DC | PRN

## 2012-01-29 MED ORDER — IBUPROFEN 600 MG PO TABS: 600.0000 mg | ORAL_TABLET | Freq: Three times a day (TID) | ORAL | Status: DC | PRN

## 2012-01-29 MED ORDER — CITALOPRAM HYDROBROMIDE 20 MG PO TABS
20.0000 mg | ORAL_TABLET | Freq: Every day | ORAL | Status: DC
  Administered 2012-01-29 – 2012-01-30 (×2): 20 mg via ORAL
  Filled 2012-01-29 (×2): qty 1

## 2012-01-29 MED ORDER — ACETAMINOPHEN 325 MG PO TABS: 650.0000 mg | ORAL_TABLET | ORAL | Status: DC | PRN

## 2012-01-29 MED ORDER — CITALOPRAM HYDROBROMIDE 10 MG PO TABS: 10.0000 mg | ORAL_TABLET | Freq: Every day | ORAL | Status: DC

## 2012-01-29 MED ORDER — LORAZEPAM 1 MG PO TABS
1.0000 mg | ORAL_TABLET | Freq: Three times a day (TID) | ORAL | Status: DC | PRN
  Administered 2012-01-29 (×2): 1 mg via ORAL
  Filled 2012-01-29 (×2): qty 1

## 2012-01-29 MED ORDER — ALUM & MAG HYDROXIDE-SIMETH 200-200-20 MG/5ML PO SUSP: 30.0000 mL | ORAL | Status: DC | PRN

## 2012-01-29 NOTE — ED Notes (Signed)
Dr Juleen China made aware that pt was seen by Dr Shela Commons. Verbal order to d/c telepsych order.

## 2012-01-29 NOTE — ED Notes (Signed)
Report given to [place], Charity fundraiser. Pt moved to Psych ED room 43. Pt aware and agreeable.

## 2012-01-29 NOTE — ED Notes (Signed)
Report called to [facility]. Charged nurse I. Little notified and aware of the need for a sitter. Pt waiting to [facility] to [facility]
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
## 2012-01-29 NOTE — ED Notes (Signed)
Pt states she could not give urine sample at this time.
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
## 2012-01-29 NOTE — ED Notes (Signed)
Pt requesting "something else for my nerves" and requesting another psychiatrist. Dr Shela Commons aware and states he has ordered Seroquel for pt at [facility] and will not order anything further until pt gives UA. Pt made aware. Pt requesting to see Dr Oletta Lamas. Made aware that Dr Oletta Lamas has left for the day and Dr Juleen China is covering. Dr Marylen Ponto line is currently engaged. Will attempt to call again shortly. Pt aware.

## 2012-01-29 NOTE — ED Notes (Signed)
MD at bedside.  Dr Elsie Saas at bedside w/ pt.

## 2012-01-30 ENCOUNTER — Emergency Department (HOSPITAL_COMMUNITY)
Admission: EM | Admit: 2012-01-30 | Discharge: 2012-01-30 | Disposition: A | Discharge status: Home / Self Care | Payer: Medicaid Other | Attending: Emergency Medicine | Admitting: Emergency Medicine

## 2012-01-30 ENCOUNTER — Encounter (HOSPITAL_COMMUNITY): Payer: Self-pay | Admitting: *Deleted

## 2012-01-30 DIAGNOSIS — F3111 Bipolar disorder, current episode manic without psychotic features, mild: Secondary | ICD-10-CM

## 2012-01-30 DIAGNOSIS — F259 Schizoaffective disorder, unspecified: Secondary | ICD-10-CM | POA: Insufficient documentation

## 2012-01-30 DIAGNOSIS — E119 Type 2 diabetes mellitus without complications: Secondary | ICD-10-CM | POA: Insufficient documentation

## 2012-01-30 DIAGNOSIS — Z590 Homelessness: Secondary | ICD-10-CM

## 2012-01-30 DIAGNOSIS — F1410 Cocaine abuse, uncomplicated: Secondary | ICD-10-CM

## 2012-01-30 DIAGNOSIS — Z87891 Personal history of nicotine dependence: Secondary | ICD-10-CM | POA: Insufficient documentation

## 2012-01-30 DIAGNOSIS — F4310 Post-traumatic stress disorder, unspecified: Secondary | ICD-10-CM | POA: Insufficient documentation

## 2012-01-30 DIAGNOSIS — Z79899 Other long term (current) drug therapy: Secondary | ICD-10-CM | POA: Insufficient documentation

## 2012-01-30 DIAGNOSIS — Z794 Long term (current) use of insulin: Secondary | ICD-10-CM | POA: Insufficient documentation

## 2012-01-30 DIAGNOSIS — J45909 Unspecified asthma, uncomplicated: Secondary | ICD-10-CM | POA: Insufficient documentation

## 2012-01-30 DIAGNOSIS — Z008 Encounter for other general examination: Secondary | ICD-10-CM | POA: Insufficient documentation

## 2012-01-30 LAB — POCT PREGNANCY, URINE: Preg Test, Ur: NEGATIVE

## 2012-01-30 LAB — RAPID URINE DRUG SCREEN, HOSP PERFORMED
Amphetamines: NOT DETECTED
Barbiturates: NOT DETECTED
Benzodiazepines: NOT DETECTED
Cocaine: POSITIVE — AB
Opiates: NOT DETECTED
Tetrahydrocannabinol: NOT DETECTED

## 2012-01-30 LAB — GLUCOSE, CAPILLARY
Glucose-Capillary: 105 mg/dL — ABNORMAL HIGH (ref 70–99)
Glucose-Capillary: 129 mg/dL — ABNORMAL HIGH (ref 70–99)
Glucose-Capillary: 152 mg/dL — ABNORMAL HIGH (ref 70–99)

## 2012-01-30 MED ORDER — HALOPERIDOL 10 MG PO TABS: 10.0000 mg | ORAL_TABLET | Freq: Every day | ORAL | Status: DC

## 2012-01-30 MED ORDER — BUSPIRONE HCL 10 MG PO TABS: 10.0000 mg | ORAL_TABLET | Freq: Three times a day (TID) | ORAL | Status: DC

## 2012-01-30 MED ORDER — CITALOPRAM HYDROBROMIDE 20 MG PO TABS: 20.0000 mg | ORAL_TABLET | Freq: Every day | ORAL | Status: DC

## 2012-01-30 MED ORDER — HALOPERIDOL 5 MG PO TABS
10.0000 mg | ORAL_TABLET | Freq: Once | ORAL | Status: DC
  Filled 2012-01-30 (×2): qty 1

## 2012-01-30 MED ORDER — INSULIN ASPART 100 UNIT/ML ~~LOC~~ SOLN: 2.0000 [IU] | Freq: Every day | SUBCUTANEOUS | Status: DC

## 2012-01-30 MED ORDER — BUSPIRONE HCL 10 MG PO TABS
10.0000 mg | ORAL_TABLET | Freq: Once | ORAL | Status: DC
  Filled 2012-01-30: qty 1

## 2012-01-30 MED ORDER — CITALOPRAM HYDROBROMIDE 20 MG PO TABS
20.0000 mg | ORAL_TABLET | Freq: Every day | ORAL | Status: DC
  Filled 2012-01-30: qty 1
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
## 2012-01-30 NOTE — ED Notes (Signed)
The patient reported to me that they we were trying to discharge her. Then she told me that her voices are getting worse and hear them loudly. If she gets discharge she will kill herself.She is very suicidal  her words

## 2012-01-30 NOTE — ED Notes (Signed)
Belongings returned to pt after leaving the unit.   Pt ambulatory w/o difficulty.

## 2012-01-30 NOTE — ED Notes (Signed)
Campos aware that the pt is having suicidal and homicidal thoughts.

## 2012-01-30 NOTE — ED Notes (Signed)
Pt was able to void and did not need to be cath, eating breakfast

## 2012-01-30 NOTE — ED Notes (Addendum)
Dc instructions given to pt, pt declined to review them.  Pt tearful/angry and reports that she is alone and has not place to go, and is angry that she is not being admitted.   Pt also reports that she is still hearing voices and feels suicidal.  Pt requesting to speak with a dr. And aware that she talked with the psychatrist today.  Pt requesting to talk w/ another MD-Dr Patria Mane aware.

## 2012-01-30 NOTE — ED Notes (Signed)
The patient stated she is tried of pulling her hair out and the voices are getting stronger and louder

## 2012-01-30 NOTE — ED Provider Notes (Addendum)
8:21 AM Filed Vitals:   01/30/12 0515  BP: 107/70  Pulse: 80  Temp: 98.2 F (36.8 C)  Resp: 18   No complaints today. Awaiting [facility] at this time. Dr Shela Commons will be asked to evaluate today for placement needs  Lyanne Co, MD 01/30/12 1610  11:05 AM Seen by psychiatry, Dr Shela Commons, who believes pt is stable for dc from ER. Not a threat to herself or others. Outpatient resources given. Please see consult note for full details    Lyanne Co, MD 01/30/12 1106
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
## 2012-01-30 NOTE — ED Notes (Signed)
Dr Patria Mane aware that pt has not voided-if still un able to void-insert foley to drain bladder and for residual. VORB dr Patria Mane

## 2012-01-30 NOTE — [facility] Counselor (Signed)
Pt was evaluated by psychiatrist who cleared for discharge. Pt was positive for cocaine in UDS. Psychiatrist recommended d/c. Relayed info to EDP who was agreeable with disposition.

## 2012-01-30 NOTE — ED Notes (Addendum)
Dr Elsie Saas aware that pt is still having suicidal and homicidal thoughts and  she will harm herself if she leaves here,  pt ok to be dc'd per dr Elsie Saas.

## 2012-01-30 NOTE — ED Notes (Signed)
Dr jonnalagadda into see 

## 2012-01-30 NOTE — Discharge Instructions (Signed)
Manic Depression (Bipolar Disorder)  Bipolar disorder is also known as manic depressive illness. It is when the brain does not function properly and causes shifts in a person's moods, energy and ability to function in everyday life. These shifts are different from the normal ups and downs that everyone experiences. Instead the shifts are severe. If this goes untreated, the person's life becomes more and more disorderly. People with this disorder can be treated can lead full and productive lives. This disorder must be managed throughout life.   SYMPTOMS    Bipolar disorder causes dramatic mood swings. These mood swings go in cycles. They cycle from extreme "highs" and irritable to deep "lows" of sadness and hopelessness.   Between the extreme moods, there are usually periods of normal mood.   Along with the mood shifts, the person will have severe changes in energy and behavior. The periods of "highs" and "lows" are called episodes of mania and depression.  Signs of mania:   Lots of energy, activity and restlessness.   Extreme "high" or good mood.   Extreme irritability.   Racing thoughts and talking very fast.   Jumping from one idea to another.   Not able to focus, easily distracted.   Little need to sleep.   Grand beliefs in one's abilities and powers.   Spending sprees.   Increased sexual drive. This can result in many sexual partners.   Poor judgment.   Abuse of drugs, particularly cocaine, alcohol, and sleeping medication.   Aggressive or provocative behavior.   A lasting period of behavior that is different from usual.   Denial that anything is wrong.  *A manic episode is identified if a "high" mood happens with three or more of the other symptoms lasting most of the day, nearly everyday for a week or longer. If the mood is more irritable in nature, four additional symptoms must be present.  Signs of depression:   Lasting feelings of sadness, anxiety, or empty mood.   Feelings of  hopelessness with negative thoughts.   Feelings of guilt, worthlessness, or helplessness.   Loss of interest or pleasure in activities once enjoyed, including sex.   Feelings of fatigue or having less energy.   Trouble focusing, making decisions, remembering.   Feeling restless or irritable.   Sleeping too little or too much.   Change in eating with possible weight gain or loss.   Feeling ongoing pain that is not caused by physical illness or injury.   Thoughts of death or suicide or suicide attempts.  *A depressive episode is identified as having five or more of the above symptoms that last most of the day, nearly everyday for two weeks or longer.  CAUSES    Research shows that there is no single cause for the disorder. Many factors act together to produce the illness.   This can be passed down from family (hereditary).   Environment may play a part.  TREATMENT    Long-term treatment is strongly recommended because bipolar disorder is a repeated illness. This disorder is better controlled if treatment is ongoing than if it is off and on.   A combination of medication and talk therapy is best for managing the disorder over time.   Medication.   Medication can be prescribed by a doctor that is an expert in treating mental disorders (psychiatrists). Medications known as "mood stabilizers" are usually prescribed to help control the illness. Other medications can be added when needed. These medicines usually treat episodes   of mania or depression that break through despite the mood stabilizer.   Talk Therapy.   Along with medication, some forms of talk therapy are helpful in providing support, education and guidance to people with the illness and their families. Studies show that this type of treatment increases mood stability, decreases need for hospitalization and improves how they function society.   Electroconvulsive Therapy (ECT).   In extreme situations where the above treatments do not work or  work too slowly to relieve severe symptoms, ECT may be considered.  Document Released: 07/28/2000 Document Revised: 04/10/2011 Document Reviewed: 03/19/2007  Exit[employer] Patient Information 2012 [employer].

## 2012-01-30 NOTE — ED Provider Notes (Signed)
History     CSN: [id number]  Arrival date & time 01/30/12  1429   None     No chief complaint on file.    HPI Just discharged with same complaint. Pt tearful/angry and reports that she is alone and has not place to go, and is angry that she is not being admitted. Pt also reports that she is still hearing voices and feels suicidal. Pt requesting to speak with a dr. And aware that she talked with the psychatrist today. Numerous visits in past for same.  Past Medical History  Diagnosis Date  . Anxiety   . Post traumatic stress disorder   . Diabetes mellitus   . Schizoaffective disorder, bipolar type   . Asthma     No past surgical history on file.  No family history on file.  History  Substance Use Topics  . Smoking status: Former Smoker -- 2.0 packs/day for 30 years    Types: Cigarettes  . Smokeless tobacco: Not on file  . Alcohol Use: No     Pt denies    OB History    Grav Para Term Preterm Abortions TAB SAB Ect Mult Living                  Review of Systems  All other systems reviewed and are negative.    Allergies  Review of patient's allergies indicates no known allergies.  Home Medications   Current Outpatient Rx  Name Route Sig Dispense Refill  . ALBUTEROL SULFATE HFA 108 (90 BASE) MCG/ACT IN AERS Inhalation Inhale 2 puffs into the lungs every 4 (four) hours as needed for wheezing or shortness of breath. 1 Inhaler 3  . BUSPIRONE HCL 10 MG PO TABS Oral Take 1 tablet (10 mg total) by mouth 3 (three) times daily. For anxiety. 90 tablet 1  . CITALOPRAM HYDROBROMIDE 20 MG PO TABS Oral Take 1 tablet (20 mg total) by mouth daily. For anxiety and depression. 30 tablet 1  . HALOPERIDOL 10 MG PO TABS Oral Take 10 mg by mouth at bedtime.    . INSULIN ASPART 100 UNIT/ML [place] SOLN Subcutaneous Inject 2 Units into the skin daily. For glycemic control 1 vial 1    LMP 12/29/2011  Physical Exam  Nursing note and vitals reviewed. Constitutional: She is oriented to  person, place, and time. She appears well-developed. No distress.  HENT:  Head: Normocephalic and atraumatic.  Eyes: Pupils are equal, round, and reactive to light.  Neck: Normal range of motion.  Cardiovascular: Normal rate and intact distal pulses.   Pulmonary/Chest: No respiratory distress.  Abdominal: Normal appearance. She exhibits no distension.  Musculoskeletal: Normal range of motion.  Neurological: She is alert and oriented to person, place, and time. No cranial nerve deficit.  Skin: Skin is warm and dry. No rash noted.  Psychiatric: Her behavior is normal. Judgment normal. Her affect is angry. Cognition and memory are normal. She expresses no suicidal plans.    ED Course  Procedures (including critical care time)  Labs Reviewed - No data to display No results found.   No diagnosis found.    MDM   Seen and evaluated by psychiatrist this a.m.  Outpatient psychiatric followup recommended.  No other changes have occurred since she was discharged.  We'll encourage her to followup as directed.      Nelia Shi, MD 01/31/12 [phone]

## 2012-01-30 NOTE — ED Notes (Signed)
Resting quietly, pt reports that the voices a bad, resting quietly

## 2012-01-30 NOTE — ED Notes (Signed)
Pt sts she is having a headache and would like a refill of her medication. Will check with her MD to see if he can refill this medication.

## 2012-01-30 NOTE — ED Notes (Addendum)
Dr Elsie Saas contacted and is aware that the pt is hearing voices and having suicidal and homicidal thoughts.  Pt referred to OP for follow up, no change in dc instructions, Pt ok to dc home per MD (dr Elsie Saas)

## 2012-01-30 NOTE — ED Notes (Signed)
I gave the patient a cup of coffee and graham crackers

## 2012-02-04 ENCOUNTER — Encounter (HOSPITAL_COMMUNITY): Payer: Self-pay | Admitting: Emergency Medicine

## 2012-02-04 ENCOUNTER — Emergency Department (HOSPITAL_COMMUNITY)
Admission: EM | Admit: 2012-02-04 | Discharge: 2012-02-04 | Disposition: A | Discharge status: Home / Self Care | Payer: Self-pay | Attending: Emergency Medicine | Admitting: Emergency Medicine

## 2012-02-04 DIAGNOSIS — F1410 Cocaine abuse, uncomplicated: Secondary | ICD-10-CM

## 2012-02-04 DIAGNOSIS — F259 Schizoaffective disorder, unspecified: Secondary | ICD-10-CM | POA: Insufficient documentation

## 2012-02-04 DIAGNOSIS — R45851 Suicidal ideations: Secondary | ICD-10-CM | POA: Insufficient documentation

## 2012-02-04 DIAGNOSIS — F4310 Post-traumatic stress disorder, unspecified: Secondary | ICD-10-CM | POA: Insufficient documentation

## 2012-02-04 DIAGNOSIS — Z046 Encounter for general psychiatric examination, requested by authority: Secondary | ICD-10-CM | POA: Insufficient documentation

## 2012-02-04 DIAGNOSIS — F1910 Other psychoactive substance abuse, uncomplicated: Secondary | ICD-10-CM | POA: Insufficient documentation

## 2012-02-04 LAB — COMPREHENSIVE METABOLIC PANEL
ALT: 5 U/L (ref 0–35)
Alkaline Phosphatase: 57 U/L (ref 39–117)
BUN: 14 mg/dL (ref 6–23)
CO2: 25 mEq/L (ref 19–32)
Calcium: 9.1 mg/dL (ref 8.4–10.5)
GFR calc Af Amer: >90 mL/min (ref >90)
GFR calc non Af Amer: 82 mL/min — ABNORMAL LOW (ref >90)
Glucose, Bld: 112 mg/dL — ABNORMAL HIGH (ref 70–99)
Potassium: 3.9 mEq/L (ref 3.5–5.1)
Sodium: 137 mEq/L (ref 135–145)

## 2012-02-04 LAB — GLUCOSE, CAPILLARY: Glucose-Capillary: 117 mg/dL — ABNORMAL HIGH (ref 70–99)

## 2012-02-04 LAB — CBC
HCT: 35.3 % — ABNORMAL LOW (ref 36.0–46.0)
Hemoglobin: 12.5 g/dL (ref 12.0–15.0)
MCH: 25.1 pg — ABNORMAL LOW (ref 26.0–34.0)
RBC: 4.99 MIL/uL (ref 3.87–5.11)

## 2012-02-04 LAB — RAPID URINE DRUG SCREEN, HOSP PERFORMED: Barbiturates: NOT DETECTED

## 2012-02-04 MED ORDER — IBUPROFEN 600 MG PO TABS: 600.0000 mg | ORAL_TABLET | Freq: Three times a day (TID) | ORAL | Status: DC | PRN

## 2012-02-04 MED ORDER — QUETIAPINE FUMARATE 200 MG PO TABS: 200.0000 mg | ORAL_TABLET | Freq: Every day | ORAL | Status: DC

## 2012-02-04 MED ORDER — ONDANSETRON HCL 4 MG PO TABS: 4.0000 mg | ORAL_TABLET | Freq: Three times a day (TID) | ORAL | Status: DC | PRN

## 2012-02-04 MED ORDER — LORAZEPAM 1 MG PO TABS
1.0000 mg | ORAL_TABLET | Freq: Three times a day (TID) | ORAL | Status: DC | PRN
  Administered 2012-02-04 (×2): 1 mg via ORAL
  Filled 2012-02-04 (×3): qty 1

## 2012-02-04 MED ORDER — ALUM & MAG HYDROXIDE-SIMETH 200-200-20 MG/5ML PO SUSP
30.0000 mL | ORAL | Status: DC | PRN
Start: 2012-02-04 — End: 2012-02-04

## 2012-02-04 NOTE — ED Notes (Signed)
To Room 43 from Triage-oriented to unit.  Pt states she has been here in the past and has been to [facility].  Pt states she has been "hearing voices" telling her to "kill myself".  States she has been very anxious and having "panic attacks".  Pt states her symptoms have worsened since she has been on her Buspar.  Pt states she wants to go back on here Seroquel twice a day.  Warm blankets and Ativan as requested for anxiousness.  No further needs at this time.

## 2012-02-04 NOTE — ED Notes (Signed)
Belongings locked in [place] [facility] per [employer]

## 2012-02-04 NOTE — ED Provider Notes (Signed)
History     CSN: [id number]  Arrival date & time 02/04/12  0111   First MD Initiated Contact with Patient 02/04/12 [phone]      Chief Complaint  Patient presents with  . Medical Clearance    (Consider location/radiation/quality/duration/timing/severity/associated sxs/prior treatment) HPI Patient since emergency department with suicidal ideation and auditory hallucinations since earlier today.  Patient's issues had a conversation with her boyfriend when she got upset and started having these thoughts.  Patient denies nausea, vomiting, fever, headache, blurred vision, chest pain, or shortness of breath.  Patient, states, that she spends beyond antidepressant medications, along with anxiety medication, but has not taken it in several months.  Patient, states she's tried to attempt suicide in the past cutting her arm. Past Medical History  Diagnosis Date  . Anxiety   . Post traumatic stress disorder   . Diabetes mellitus   . Schizoaffective disorder, bipolar type   . Asthma     History reviewed. No pertinent past surgical history.  No family history on file.  History  Substance Use Topics  . Smoking status: Former Smoker -- 2.0 packs/day for 30 years    Types: Cigarettes  . Smokeless tobacco: Not on file  . Alcohol Use: No     Pt denies    OB History    Grav Para Term Preterm Abortions TAB SAB Ect Mult Living                  Review of Systems All other systems negative except as documented in the HPI. All pertinent positives and negatives as reviewed in the HPI.  Allergies  Review of patient's allergies indicates no known allergies.  Home Medications   Current Outpatient Rx  Name Route Sig Dispense Refill  . ALBUTEROL SULFATE HFA 108 (90 BASE) MCG/ACT IN AERS Inhalation Inhale 2 puffs into the lungs every 4 (four) hours as needed for wheezing or shortness of breath. 1 Inhaler 3  . BUSPIRONE HCL 10 MG PO TABS Oral Take 1 tablet (10 mg total) by mouth 3 (three) times  daily. For anxiety. 30 tablet 1  . CITALOPRAM HYDROBROMIDE 20 MG PO TABS Oral Take 1 tablet (20 mg total) by mouth daily. For anxiety and depression. 30 tablet 1  . HALOPERIDOL 10 MG PO TABS Oral Take 1 tablet (10 mg total) by mouth at bedtime. 30 tablet 0  . INSULIN ASPART 100 UNIT/ML [place] SOLN Subcutaneous Inject 2 Units into the skin daily. For glycemic control 1 vial 1    BP 132/81  Pulse 81  Temp 98.2 F (36.8 C) (Oral)  Resp 18  Ht 5\' 2"  (1.575 m)  Wt 162 lb 2 oz (73.539 kg)  BMI 29.65 kg/m2  SpO2 98%  LMP 12/29/2011  Physical Exam  Nursing note and vitals reviewed. Constitutional: She is oriented to person, place, and time. She appears well-developed and well-nourished.  HENT:  Head: Normocephalic and atraumatic.  Mouth/Throat: Oropharynx is clear and moist.  Eyes: Pupils are equal, round, and reactive to light.  Neck: Normal range of motion. Neck supple.  Cardiovascular: Normal rate, regular rhythm and normal heart sounds.  Exam reveals no gallop and no friction rub.   No murmur heard. Pulmonary/Chest: Effort normal and breath sounds normal. No respiratory distress.  Lymphadenopathy:    She has no cervical adenopathy.  Neurological: She is alert and oriented to person, place, and time.  Skin: Skin is warm and dry.  Psychiatric: Her mood appears anxious. She expresses suicidal ideation.  Ed Course  Procedures (including critical care time)  Labs Reviewed  CBC - Abnormal; Notable for the following:    HCT 35.3 (*)     MCV 70.7 (*)     MCH 25.1 (*)     All other components within normal limits  COMPREHENSIVE METABOLIC PANEL - Abnormal; Notable for the following:    Glucose, Bld 112 (*)     Total Bilirubin 0.2 (*)     GFR calc non Af Amer 82 (*)     All other components within normal limits  URINE RAPID DRUG SCREEN (HOSP PERFORMED) - Abnormal; Notable for the following:    Cocaine POSITIVE (*)     All other components within normal limits  ETHANOL   Patient  be placed in the Psych ED and evaluated by the ACT team.    MDM          Carlyle Dolly, PA-C 02/04/12 [phone]

## 2012-02-04 NOTE — ED Notes (Signed)
Pt alert, arrives from home via [employer], c/o SI, per pt "i am hearing voices", resp even unlabored, skin pwd, denies HI, states "i will cut myself"

## 2012-02-04 NOTE — Progress Notes (Signed)
Received seroquel Rx Processed and tubed to [facility] pharmacy with note to contact psych ED Rn when ready for pick up Noted pt without pcp and insurance coverage Cm spoke with pt to review CM consult to assist with medications, reviewed [facility] medication indigent program (annual assist) and reviewed resources to include discounted pharmacies, DSS, Health Dept and financial resources Pt states she will be going to [facility] on 02/05/12 to get further medication assistance Voice understanding and appreciation of resources and services offered ED RN updated Cm signing off

## 2012-02-04 NOTE — ED Provider Notes (Signed)
Medical screening examination/treatment/procedure(s) were performed by non-physician practitioner and as supervising physician I was immediately available for consultation/collaboration.    Malory Spurr D Tavonna Worthington, MD 02/04/12 0809 

## 2012-02-04 NOTE — Consult Note (Signed)
Reason for Consult: Cocaine intoxication, cocaine abuse and substance induced psychotic symptoms Referring Physician: Dr. Candy Sledge is an 46 y.o. female.  HPI: Patient was seen and chart reviewed. Patient was known to this provider from her previous the [facility] long emergency department visit about 5 days ago. Patient has been using crack cocaine over 25 years and her body demands to use more cocaine. Patient also reported when she used cocaine, she started hearing voices of female people, which are making her irritable, agitated and frustrated. Patient has been staying with her boyfriend of 3 years on and off and getting crack cocaine $5 or $10 worth from the friends and around the community. Patient stated that that her family does not like her, does not support her because of for negative attitude , cocaine addiction and aggression. 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 Patient has previous acute psychiatric hospitalization and medication changes while psychotic with the crack cocaine. Patient was a specifically seeking or requesting medication like Seroquel and Xanax which was initially given by psychiatrist in [facility] recovery services about a year ago [place], [place]. Patient was not supported by her 46 years old daughter where she stayed about 6 months in [place] and did not get along well. Reportedly she stayed with her grand mother and cons, who were aware of her drug of abuse and not supportive to her. Patient has been making statements of suicide ideation, but want to be released to when learned, that she's not going to be offered Xanax. Patient is not reliable or dependable on history secondary to drug seeking behavior. Patient should not be supported on medications which causes addiction potential. Patient does not accept benzodiazepines, other than Xanax and stated that BuSpar does not work. Patient was attributed to her psychotic symptoms and  aggression as a bipolar disorder in the past.  Patient does not appear to be suffering with the psychotic symptoms, hallucinations, delusions, paranoia, or responding to internal stimuli. She has been talking loud, expressed emotional movements, dysphoria, and anger. Patient has no suicidal or homicidal ideation, intentions, or plans.  Past Medical History  Diagnosis Date  . Anxiety   . Post traumatic stress disorder   . Diabetes mellitus   . Schizoaffective disorder, bipolar type   . Asthma     History reviewed. No pertinent past surgical history.  No family history on file.  Social History:  reports that she has quit smoking. Her smoking use included Cigarettes. She has a 60 pack-year smoking history. She does not have any smokeless tobacco history on file. She reports that she uses illicit drugs ("Crack" cocaine). She reports that she does not drink alcohol.  Allergies: No Known Allergies  Medications: I have reviewed the patient's current medications.  Results for orders placed during the hospital encounter of 02/04/12 (from the past 48 hour(s))  CBC     Status: Abnormal   Collection Time   02/04/12  1:28 AM      Component Value Range Comment   WBC 8.5  4.0 - 10.5 K/uL    RBC 4.99  3.87 - 5.11 MIL/uL    Hemoglobin 12.5  12.0 - 15.0 g/dL    HCT 40.9 (*) 81.1 - 46.0 %    MCV 70.7 (*) 78.0 - 100.0 fL    MCH 25.1 (*) 26.0 - 34.0 pg    MCHC 35.4  30.0 - 36.0 g/dL    RDW 91.4  78.2 -  15.5 %    Platelets 281  150 - 400 K/uL   COMPREHENSIVE METABOLIC PANEL     Status: Abnormal   Collection Time   02/04/12  1:28 AM      Component Value Range Comment   Sodium 137  135 - 145 mEq/L    Potassium 3.9  3.5 - 5.1 mEq/L    Chloride 102  96 - 112 mEq/L    CO2 25  19 - 32 mEq/L    Glucose, Bld 112 (*) 70 - 99 mg/dL    BUN 14  6 - 23 mg/dL    Creatinine, Ser 1.61  0.50 - 1.10 mg/dL    Calcium 9.1  8.4 - 09.6 mg/dL    Total Protein 7.0  6.0 - 8.3 g/dL    Albumin 3.7  3.5 - 5.2 g/dL     AST 16  0 - 37 U/L    ALT 5  0 - 35 U/L    Alkaline Phosphatase 57  39 - 117 U/L    Total Bilirubin 0.2 (*) 0.3 - 1.2 mg/dL    GFR calc non Af Amer 82 (*) >90 mL/min    GFR calc Af Amer >90  >90 mL/min   ETHANOL     Status: Normal   Collection Time   02/04/12  1:28 AM      Component Value Range Comment   Alcohol, Ethyl (B) <11  0 - 11 mg/dL   URINE RAPID DRUG SCREEN (HOSP PERFORMED)     Status: Abnormal   Collection Time   02/04/12  1:50 AM      Component Value Range Comment   Opiates NONE DETECTED  NONE DETECTED    Cocaine POSITIVE (*) NONE DETECTED    Benzodiazepines NONE DETECTED  NONE DETECTED    Amphetamines NONE DETECTED  NONE DETECTED    Tetrahydrocannabinol NONE DETECTED  NONE DETECTED    Barbiturates NONE DETECTED  NONE DETECTED   GLUCOSE, CAPILLARY     Status: Normal   Collection Time   02/04/12 10:29 AM      Component Value Range Comment   Glucose-Capillary 96  70 - 99 mg/dL    Comment 1 Documented in Chart      Comment 2 Notify RN     GLUCOSE, CAPILLARY     Status: Abnormal   Collection Time   02/04/12  5:10 PM      Component Value Range Comment   Glucose-Capillary 117 (*) 70 - 99 mg/dL     No results found.  Positive for anxiety, depression, illegal drug usage and sleep disturbance Blood pressure 156/88, pulse 84, temperature 98.2 F (36.8 C), temperature source Oral, resp. rate 18, height 5\' 2"  (1.575 m), weight 162 lb 2 oz (73.539 kg), last menstrual period 12/29/2011, SpO2 94.00%.   Assessment/Plan: Cocaine intoxication and cocaine abuse. Substance-induced psychosis.  Patient does not meet criteria for acute psychiatric hospitalization, and she'll be referred to the outpatient psychiatric services and also may provide prescription for Seroquel 200 mg at bedtime for 3 days. Patient will be receiving local resources for following up with outpatient care  [facility] R. 02/04/2012, 5:51 PM
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
## 2012-02-04 NOTE — Progress Notes (Signed)
[facility] ED CM consulted by ACT member, [place] to provide medication assistance for d/c CM reviewed EPIC notes CM spoke with terrance in [facility] pharmacy to confirm pt is eligible for chs medication assistance.  CM spoke with Dr Fonnie Jarvis to request Rx to be processed in [facility] pharmacy.

## 2012-02-04 NOTE — ED Notes (Signed)
MD at bedside.  Psych MD Jonnalagodda present to evaluate this pt

## 2012-02-04 NOTE — ED Notes (Signed)
Pharmacy tech here to reevaluate this pt medication list

## 2012-02-08 ENCOUNTER — Emergency Department (HOSPITAL_COMMUNITY)
Admission: EM | Admit: 2012-02-08 | Discharge: 2012-02-11 | Disposition: A | Discharge status: Other Acute Inpatient Hospital | Payer: Medicaid Other | Attending: Emergency Medicine | Admitting: Emergency Medicine

## 2012-02-08 ENCOUNTER — Encounter (HOSPITAL_COMMUNITY): Payer: Self-pay | Admitting: Emergency Medicine

## 2012-02-08 DIAGNOSIS — F259 Schizoaffective disorder, unspecified: Secondary | ICD-10-CM | POA: Insufficient documentation

## 2012-02-08 DIAGNOSIS — F4310 Post-traumatic stress disorder, unspecified: Secondary | ICD-10-CM | POA: Insufficient documentation

## 2012-02-08 DIAGNOSIS — K920 Hematemesis: Secondary | ICD-10-CM | POA: Insufficient documentation

## 2012-02-08 DIAGNOSIS — R45851 Suicidal ideations: Secondary | ICD-10-CM

## 2012-02-08 DIAGNOSIS — Z794 Long term (current) use of insulin: Secondary | ICD-10-CM | POA: Insufficient documentation

## 2012-02-08 DIAGNOSIS — F1410 Cocaine abuse, uncomplicated: Secondary | ICD-10-CM | POA: Insufficient documentation

## 2012-02-08 DIAGNOSIS — Z87891 Personal history of nicotine dependence: Secondary | ICD-10-CM | POA: Insufficient documentation

## 2012-02-08 DIAGNOSIS — J45909 Unspecified asthma, uncomplicated: Secondary | ICD-10-CM | POA: Insufficient documentation

## 2012-02-08 DIAGNOSIS — F411 Generalized anxiety disorder: Secondary | ICD-10-CM | POA: Insufficient documentation

## 2012-02-08 DIAGNOSIS — Z79899 Other long term (current) drug therapy: Secondary | ICD-10-CM | POA: Insufficient documentation

## 2012-02-08 DIAGNOSIS — E119 Type 2 diabetes mellitus without complications: Secondary | ICD-10-CM | POA: Insufficient documentation

## 2012-02-08 LAB — COMPREHENSIVE METABOLIC PANEL
ALT: 19 U/L (ref 0–35)
AST: 18 U/L (ref 0–37)
Alkaline Phosphatase: 54 U/L (ref 39–117)
CO2: 23 mEq/L (ref 19–32)
Calcium: 9.2 mg/dL (ref 8.4–10.5)
Chloride: 105 mEq/L (ref 96–112)
GFR calc Af Amer: 77 mL/min — ABNORMAL LOW (ref >90)
GFR calc non Af Amer: 66 mL/min — ABNORMAL LOW (ref >90)
Glucose, Bld: 96 mg/dL (ref 70–99)
Potassium: 4 mEq/L (ref 3.5–5.1)
Sodium: 138 mEq/L (ref 135–145)
Total Bilirubin: 0.2 mg/dL — ABNORMAL LOW (ref 0.3–1.2)

## 2012-02-08 LAB — RAPID URINE DRUG SCREEN, HOSP PERFORMED
Amphetamines: NOT DETECTED
Barbiturates: NOT DETECTED
Opiates: NOT DETECTED
Tetrahydrocannabinol: NOT DETECTED

## 2012-02-08 LAB — ACETAMINOPHEN LEVEL: Acetaminophen (Tylenol), Serum: <15 ug/mL (ref 10–30)

## 2012-02-08 LAB — CBC
Hemoglobin: 12 g/dL (ref 12.0–15.0)
MCH: 25.1 pg — ABNORMAL LOW (ref 26.0–34.0)
Platelets: 251 10*3/uL (ref 150–400)
RBC: 4.78 MIL/uL (ref 3.87–5.11)
WBC: 9.9 10*3/uL (ref 4.0–10.5)

## 2012-02-08 MED ORDER — ALPRAZOLAM 0.25 MG PO TABS
0.5000 mg | ORAL_TABLET | Freq: Once | ORAL | Status: AC
  Administered 2012-02-08: 0.5 mg via ORAL
  Filled 2012-02-08: qty 2

## 2012-02-08 MED ORDER — QUETIAPINE FUMARATE 200 MG PO TABS
200.0000 mg | ORAL_TABLET | Freq: Every day | ORAL | Status: DC
  Administered 2012-02-09 – 2012-02-10 (×3): 200 mg via ORAL
  Filled 2012-02-08 (×3): qty 1

## 2012-02-08 MED ORDER — IBUPROFEN 200 MG PO TABS: 600.0000 mg | ORAL_TABLET | Freq: Three times a day (TID) | ORAL | Status: DC | PRN

## 2012-02-08 MED ORDER — ACETAMINOPHEN 325 MG PO TABS: 650.0000 mg | ORAL_TABLET | ORAL | Status: DC | PRN

## 2012-02-08 MED ORDER — ALPRAZOLAM 0.25 MG PO TABS
1.0000 mg | ORAL_TABLET | Freq: Three times a day (TID) | ORAL | Status: DC
  Administered 2012-02-09 – 2012-02-10 (×6): 1 mg via ORAL
  Filled 2012-02-08 (×6): qty 4
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
## 2012-02-08 NOTE — ED Notes (Signed)
Security has wanded pt ,and reports pt is good to go

## 2012-02-08 NOTE — ED Notes (Signed)
Per EMS, pt called for being suicidal and vomiting blood; 140/70, HR 74, RR 16, CBG 85;

## 2012-02-08 NOTE — ED Notes (Signed)
Sitter arrive to triage--sent to D36 and Medical laboratory scientific officer that sitter is on her way

## 2012-02-08 NOTE — ED Notes (Signed)
MEAL TRAY GIVEN TO PT

## 2012-02-08 NOTE — ED Notes (Signed)
[facility] nurse notified about pt, and security in route to wand

## 2012-02-08 NOTE — ED Notes (Signed)
Pt reports feeling depressed all day, reports took insulin, and 5 minutes afterwards started throwing up blood; reports depressed, suicidal, and anxious; reports plan is with pills, and also reports that she cuts herself

## 2012-02-09 MED ORDER — ALBUTEROL SULFATE HFA 108 (90 BASE) MCG/ACT IN AERS
2.0000 | INHALATION_SPRAY | RESPIRATORY_TRACT | Status: DC | PRN
  Administered 2012-02-09 – 2012-02-10 (×2): 2 via RESPIRATORY_TRACT
  Filled 2012-02-09: qty 6.7

## 2012-02-09 NOTE — ED Notes (Signed)
Mental health at bedside for pt evaluation, sitter remains at bedside.

## 2012-02-09 NOTE — ED Notes (Signed)
Diet tray ordered from [employer]

## 2012-02-09 NOTE — ED Notes (Signed)
Pt resting comfortable with no s/s of any pain or distress. Sitter remains at bedside, snack given to pt and will continue to monitor pt.

## 2012-02-09 NOTE — ED Notes (Signed)
Pt reports anxiety 10/10. 

## 2012-02-09 NOTE — ED Notes (Signed)
Contact # for mother Brandt Loosen [phone] Dwan Bolt [phone]

## 2012-02-09 NOTE — ED Notes (Signed)
Report received, assumed care.  

## 2012-02-09 NOTE — ED Notes (Signed)
Scheduled xanax held due to pt drowsiness, sleeping all morning.
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
## 2012-02-10 ENCOUNTER — Encounter (HOSPITAL_COMMUNITY): Payer: Self-pay

## 2012-02-10 MED ORDER — ZIPRASIDONE MESYLATE 20 MG IM SOLR
10.0000 mg | Freq: Once | INTRAMUSCULAR | Status: AC
  Administered 2012-02-10: 10 mg via INTRAMUSCULAR
  Filled 2012-02-10: qty 20

## 2012-02-10 NOTE — ED Notes (Signed)
A cup of ice water given to pt

## 2012-02-10 NOTE — ED Notes (Signed)
Pt remains asleep. Pt does not appear to be in any acute distress at this time.

## 2012-02-10 NOTE — ED Notes (Signed)
Lunch tray arrived.   

## 2012-02-10 NOTE — Progress Notes (Signed)
Agitated, so given Geodon 10 mg IM.

## 2012-02-10 NOTE — ED Notes (Addendum)
Pt remains asleep. Pt does not appear to be in any acute distress currently.

## 2012-02-10 NOTE — ED Notes (Signed)
Pt remains asleep with sitter at bedside. Pt does not appear to be in any acute distress at this time.

## 2012-02-10 NOTE — ED Notes (Signed)
Fleet Contras from Pharmacy came by pts room and spoke with pt about Seroquel. Fleet Contras stated medication would be fixed in computer for pt to take one dose in morning and one at night.

## 2012-02-10 NOTE — ED Notes (Signed)
Pt states she has been waiting on Seroquel since 1000 this morning and is requesting it now. Pt states "having suicidal thoughts" and needs the medication for her bipolar and schizophrenia.Pt states "I am fixing to walk up out of here."  Pt informed that pharmacy would be called and notified of the situation.

## 2012-02-10 NOTE — ED Notes (Signed)
Pt asked about taking a shower, pt states that she will take one later

## 2012-02-10 NOTE — ED Notes (Signed)
Pt asking for applesauce and graham crackers, Lyn, RN informed

## 2012-02-10 NOTE — ED Notes (Signed)
Baxter Hire spoke with pt about situation and plan of care. Pt is currently in better mood and happy we are looking into her care.

## 2012-02-10 NOTE — ED Notes (Signed)
Spoke with Service Response Darl Pikes and Thayer Ohm) faxed and ordered regular no sharps meal tray

## 2012-02-10 NOTE — ED Notes (Signed)
Pt given milk 

## 2012-02-10 NOTE — ED Notes (Signed)
Spoke with Harriett Sine from [facility] about pt Seroquel. Told that the doctor needed to change the dosage for the pt to receive an AM and PM dose of Seroquel like the pt is requesting. Pt was notified of plan. Lyn, RN speaking with doctor now.

## 2012-02-10 NOTE — ED Notes (Signed)
Sitter has returned 

## 2012-02-10 NOTE — Progress Notes (Signed)
8:19 AM Was asleep on rounds.  Pending bed at [facility].

## 2012-02-10 NOTE — ED Notes (Signed)
FSBS at 120.  Crackers given per request

## 2012-02-10 NOTE — ED Notes (Signed)
Pt woke up and took medications. Pt requested cracker pack for "bad taste in mouth" after taking medications. Pt given pack of crackers.

## 2012-02-10 NOTE — ED Notes (Signed)
Called pharmacy, [facility], and asked about switching 4x0.25mg  Xanax to "one blue pill" per pts request. Pharmacy states that not sure if it will be one blue pill but can send up 1mg  tablet when needed. Pharmacy questioned about pts Seroquel and being able to take one in morning and one at night. Pharmacy states will call pts home pharmacy to see if they can figure out the correct dosage for Seroquel and will fix on medication list.

## 2012-02-10 NOTE — ED Notes (Signed)
Pt resting, sitter at bedside

## 2012-02-10 NOTE — ED Notes (Signed)
Relieving sitter

## 2012-02-10 NOTE — ED Notes (Signed)
Pt awake and eating dinner

## 2012-02-11 ENCOUNTER — Inpatient Hospital Stay (HOSPITAL_COMMUNITY)
Admission: AD | Admit: 2012-02-11 | Discharge: 2012-02-18 | DRG: 885 | Disposition: A | Discharge status: Home / Self Care | Payer: Federal, State, Local not specified - Other | Source: Ambulatory Visit | Attending: Psychiatry | Admitting: Psychiatry

## 2012-02-11 ENCOUNTER — Encounter (HOSPITAL_COMMUNITY): Payer: Self-pay | Admitting: *Deleted

## 2012-02-11 DIAGNOSIS — F3160 Bipolar disorder, current episode mixed, unspecified: Secondary | ICD-10-CM

## 2012-02-11 DIAGNOSIS — Z79899 Other long term (current) drug therapy: Secondary | ICD-10-CM

## 2012-02-11 DIAGNOSIS — IMO0002 Reserved for concepts with insufficient information to code with codable children: Secondary | ICD-10-CM

## 2012-02-11 DIAGNOSIS — F1410 Cocaine abuse, uncomplicated: Secondary | ICD-10-CM | POA: Diagnosis present

## 2012-02-11 DIAGNOSIS — F14159 Cocaine abuse with cocaine-induced psychotic disorder, unspecified: Secondary | ICD-10-CM

## 2012-02-11 DIAGNOSIS — Z590 Homelessness: Secondary | ICD-10-CM

## 2012-02-11 DIAGNOSIS — F39 Unspecified mood [affective] disorder: Principal | ICD-10-CM | POA: Diagnosis present

## 2012-02-11 DIAGNOSIS — F4310 Post-traumatic stress disorder, unspecified: Secondary | ICD-10-CM | POA: Diagnosis present

## 2012-02-11 DIAGNOSIS — E119 Type 2 diabetes mellitus without complications: Secondary | ICD-10-CM | POA: Diagnosis present

## 2012-02-11 DIAGNOSIS — F3111 Bipolar disorder, current episode manic without psychotic features, mild: Secondary | ICD-10-CM

## 2012-02-11 DIAGNOSIS — F411 Generalized anxiety disorder: Secondary | ICD-10-CM | POA: Diagnosis present

## 2012-02-11 DIAGNOSIS — J45909 Unspecified asthma, uncomplicated: Secondary | ICD-10-CM | POA: Diagnosis present

## 2012-02-11 DIAGNOSIS — F1910 Other psychoactive substance abuse, uncomplicated: Secondary | ICD-10-CM

## 2012-02-11 DIAGNOSIS — F17200 Nicotine dependence, unspecified, uncomplicated: Secondary | ICD-10-CM | POA: Diagnosis present

## 2012-02-11 HISTORY — DX: Unspecified convulsions: R56.9

## 2012-02-11 HISTORY — DX: Headache: R51

## 2012-02-11 LAB — GLUCOSE, CAPILLARY: Glucose-Capillary: 83 mg/dL (ref 70–99)

## 2012-02-11 MED ORDER — QUETIAPINE FUMARATE 50 MG PO TABS
50.0000 mg | ORAL_TABLET | Freq: Three times a day (TID) | ORAL | Status: DC
  Administered 2012-02-11 – 2012-02-12 (×2): 50 mg via ORAL
  Filled 2012-02-11 (×7): qty 1

## 2012-02-11 MED ORDER — NICOTINE 21 MG/24HR TD PT24
21.0000 mg | MEDICATED_PATCH | Freq: Every day | TRANSDERMAL | Status: DC
  Administered 2012-02-12 – 2012-02-18 (×7): 21 mg via TRANSDERMAL
  Filled 2012-02-11 (×10): qty 1

## 2012-02-11 MED ORDER — LORAZEPAM 1 MG PO TABS: 1.0000 mg | ORAL_TABLET | Freq: Three times a day (TID) | ORAL | Status: DC

## 2012-02-11 MED ORDER — TRAZODONE HCL 50 MG PO TABS
50.0000 mg | ORAL_TABLET | Freq: Every evening | ORAL | Status: DC | PRN
  Administered 2012-02-11: 50 mg via ORAL
  Filled 2012-02-11 (×6): qty 1

## 2012-02-11 MED ORDER — QUETIAPINE FUMARATE 100 MG PO TABS
100.0000 mg | ORAL_TABLET | Freq: Every day | ORAL | Status: DC
  Administered 2012-02-11: 100 mg via ORAL
  Filled 2012-02-11 (×2): qty 1

## 2012-02-11 MED ORDER — QUETIAPINE FUMARATE 50 MG PO TABS
50.0000 mg | ORAL_TABLET | Freq: Two times a day (BID) | ORAL | Status: DC
  Administered 2012-02-11: 50 mg via ORAL
  Filled 2012-02-11 (×5): qty 1

## 2012-02-11 MED ORDER — LORAZEPAM 1 MG PO TABS: 1.0000 mg | ORAL_TABLET | Freq: Once | ORAL | Status: DC

## 2012-02-11 MED ORDER — INSULIN ASPART 100 UNIT/ML ~~LOC~~ SOLN
0.0000 [IU] | Freq: Every day | SUBCUTANEOUS | Status: DC
  Administered 2012-02-13 – 2012-02-15 (×3): 2 [IU] via SUBCUTANEOUS

## 2012-02-11 MED ORDER — ALBUTEROL SULFATE HFA 108 (90 BASE) MCG/ACT IN AERS
2.0000 | INHALATION_SPRAY | Freq: Four times a day (QID) | RESPIRATORY_TRACT | Status: DC | PRN
  Administered 2012-02-11 – 2012-02-13 (×2): 2 via RESPIRATORY_TRACT
  Filled 2012-02-11 (×2): qty 6.7

## 2012-02-11 MED ORDER — ACETAMINOPHEN 325 MG PO TABS
650.0000 mg | ORAL_TABLET | Freq: Four times a day (QID) | ORAL | Status: DC | PRN
  Administered 2012-02-13: 650 mg via ORAL

## 2012-02-11 MED ORDER — INSULIN ASPART 100 UNIT/ML ~~LOC~~ SOLN
0.0000 [IU] | Freq: Three times a day (TID) | SUBCUTANEOUS | Status: DC
  Administered 2012-02-12 – 2012-02-17 (×5): 2 [IU] via SUBCUTANEOUS

## 2012-02-11 MED ORDER — LORAZEPAM 1 MG PO TABS
1.0000 mg | ORAL_TABLET | Freq: Every day | ORAL | Status: DC
  Filled 2012-02-11: qty 1

## 2012-02-11 MED ORDER — MAGNESIUM HYDROXIDE 400 MG/5ML PO SUSP: 30.0000 mL | Freq: Every day | ORAL | Status: DC | PRN

## 2012-02-11 MED ORDER — ALUM & MAG HYDROXIDE-SIMETH 200-200-20 MG/5ML PO SUSP: 30.0000 mL | ORAL | Status: DC | PRN

## 2012-02-11 MED ORDER — LORAZEPAM 1 MG PO TABS
1.0000 mg | ORAL_TABLET | Freq: Two times a day (BID) | ORAL | Status: DC
  Administered 2012-02-12 (×2): 1 mg via ORAL
  Filled 2012-02-11 (×2): qty 1

## 2012-02-11 MED ORDER — LORAZEPAM 1 MG PO TABS
1.0000 mg | ORAL_TABLET | Freq: Four times a day (QID) | ORAL | Status: DC
  Administered 2012-02-11 (×3): 1 mg via ORAL
  Filled 2012-02-11 (×3): qty 1

## 2012-02-11 MED ORDER — LORAZEPAM 1 MG PO TABS: 1.0000 mg | ORAL_TABLET | Freq: Two times a day (BID) | ORAL | Status: DC
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
## 2012-02-11 NOTE — ED Provider Notes (Signed)
Kathleen [name] is a 46 y.o. female who has been accepted at the behavioral health hospital for treatment of her acute psychiatric illness.  Flint Melter, MD 02/11/12 [phone]

## 2012-02-11 NOTE — Progress Notes (Signed)
Pt admitted voluntary with PTSD, auditory hallucinations and no visual hallucinations. Pt has been to [facility] in the past. Pt reports voices are to hurt herself and her ex boyfriend and mother. Pt denies substance abuse reporting "I am clean, clean clean." The cocaine test came back positive. Pt's mood is unstable and irritable. She refuses treatment from different doctors on the unit. She has a medical hx of diabetes and asthma. Pt reports that she blacks out when she is angry and has had a seizure in the past. She is divorced and has a 51 yr old daughter that lives in [place]. Pt contracts for safety on admission.

## 2012-02-11 NOTE — ED Notes (Signed)
First meeting with patient. Patient sleeping and easily aroused. Sitter at bedside.

## 2012-02-11 NOTE — Progress Notes (Signed)
[facility] Group Notes:  (Counselor/Nursing/MHT/Case Management/Adjunct)  02/11/2012 1:39 PM  Type of Therapy:  Psychoeducational Skills  Participation Level:  Did Not Attend    Veto Kemps, MT-BC  02/11/2012, 1:39 PM
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
## 2012-02-11 NOTE — ED Notes (Signed)
Fingerstick of 103.

## 2012-02-11 NOTE — Tx Team (Signed)
Initial Interdisciplinary Treatment Plan  PATIENT STRENGTHS: (choose at least two) General fund of knowledge  PATIENT STRESSORS: Medication change or noncompliance   PROBLEM LIST: Problem List/Patient Goals Date to be addressed Date deferred Reason deferred Estimated date of resolution  SI 02/11/12     HI 02/11/12     hallucinations 02/11/12                                          DISCHARGE CRITERIA:  Ability to meet basic life and health needs Improved stabilization in mood, thinking, and/or behavior Medical problems require only outpatient monitoring Motivation to continue treatment in a less acute level of care Need for constant or close observation no longer present Reduction of life-threatening or endangering symptoms to within safe limits Verbal commitment to aftercare and medication compliance  PRELIMINARY DISCHARGE PLAN: Outpatient therapy Return to previous living arrangement  PATIENT/FAMIILY INVOLVEMENT: This treatment plan has been presented to and reviewed with the patient, Kathleen [name], and/or family member, .The patient and family have been given the opportunity to ask questions and make suggestions.  Beatrix Shipper 02/11/2012, 10:33 AM

## 2012-02-11 NOTE — Progress Notes (Signed)
Patient ID: Kathleen [name], female   DOB: 1965-06-20, 46 y.o.   MRN: [id number]   D: Patient states not doing that well today. Reports mood up and down. States that she needed to come in to get medications adjusted. Reports still having the auditory hallucinations. Previous shift reports labile mood and angers easily. Refused lab tonight per technician. Will reschedule to see if she will get tomorrow. Wanting a snack saying that she feels like her blood sugar is low.  A: Staff will monitor on q 15 minute checks and follow treatments and give medications as ordered. R: Got snack and was appreciative. No agitation toward undersigned at this time.
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
## 2012-02-11 NOTE — ED Notes (Signed)
Pt given graham crackers and milk.  

## 2012-02-11 NOTE — ED Notes (Signed)
Patient refused to have vitals taken

## 2012-02-11 NOTE — Progress Notes (Signed)
Patient ID: Kathleen [name], female   DOB: 05/16/1965, 46 y.o.   MRN: [id number]   D: Patient upset that she doesn't get 2 units of insulin at bedtime. Patient argumentative when explaining protocol then asked to speak to nursing supervisor. Explained over again by K. Brooks Press photographer. Patient still not happy about it but went to bed. Patient exhibits entitlement behaviors and trying to staff split tonight. Patient did not have this lability until she got off phone with someone earlier that possibly upset her.  A: Staff to follow treatments and give medications as ordered. R: Went to room at this time.

## 2012-02-12 LAB — GLUCOSE, CAPILLARY
Glucose-Capillary: 125 mg/dL — ABNORMAL HIGH (ref 70–99)
Glucose-Capillary: 94 mg/dL (ref 70–99)

## 2012-02-12 MED ORDER — QUETIAPINE FUMARATE 100 MG PO TABS
100.0000 mg | ORAL_TABLET | Freq: Three times a day (TID) | ORAL | Status: DC
  Filled 2012-02-12 (×5): qty 1

## 2012-02-12 MED ORDER — QUETIAPINE FUMARATE 200 MG PO TABS
200.0000 mg | ORAL_TABLET | Freq: Every day | ORAL | Status: DC
  Administered 2012-02-13: 200 mg via ORAL
  Filled 2012-02-12 (×4): qty 1

## 2012-02-12 MED ORDER — HYDROXYZINE HCL 50 MG PO TABS
50.0000 mg | ORAL_TABLET | Freq: Three times a day (TID) | ORAL | Status: DC | PRN
  Administered 2012-02-12 – 2012-02-13 (×6): 50 mg via ORAL
  Filled 2012-02-12: qty 1

## 2012-02-12 MED ORDER — QUETIAPINE FUMARATE 100 MG PO TABS
100.0000 mg | ORAL_TABLET | Freq: Two times a day (BID) | ORAL | Status: DC
  Administered 2012-02-13: 100 mg via ORAL
  Filled 2012-02-12 (×5): qty 1

## 2012-02-12 MED ORDER — QUETIAPINE FUMARATE 100 MG PO TABS
100.0000 mg | ORAL_TABLET | Freq: Three times a day (TID) | ORAL | Status: DC
  Administered 2012-02-12: 100 mg via ORAL
  Filled 2012-02-12 (×6): qty 1

## 2012-02-12 MED ORDER — CITALOPRAM HYDROBROMIDE 20 MG PO TABS
10.0000 mg | ORAL_TABLET | Freq: Every day | ORAL | Status: DC
  Administered 2012-02-12 – 2012-02-14 (×3): 10 mg via ORAL
  Filled 2012-02-12 (×4): qty 0.5
  Filled 2012-02-12: qty 1
  Filled 2012-02-12: qty 0.5

## 2012-02-12 MED ORDER — BUSPIRONE HCL 10 MG PO TABS
10.0000 mg | ORAL_TABLET | Freq: Three times a day (TID) | ORAL | Status: DC
  Administered 2012-02-12 – 2012-02-13 (×2): 10 mg via ORAL
  Filled 2012-02-12 (×10): qty 1

## 2012-02-12 NOTE — Progress Notes (Signed)
Met with pt and RN Rayfield Citizen to discuss pt's concerns regarding her medication regimen.  Pt demanding to be discharged.  Pt agitated about the fact that her seroquel dosage had been lowered at HS and that her ativan taper had ended.  Pt originally wanted klonopin to be started, but after told that that was not an option she wanted to restart her Buspar and for her HS seroquel to be increased to 200mg  so that she could sleep and be less irritable.  Contacted Dr. Lucianne Muss who restarted her buspar 10mg  TID and increased her seroquel to 200 mg at HS.  RN and I went back to discuss the med changes and make clear to pt that benzo's were not an option and that buspar was restarted and that her HS Seroquel was increased to 200mg .  Pt verbalized understanding of med changes and voiced satisfaction with the plan.
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
## 2012-02-12 NOTE — Progress Notes (Signed)
Pt requested to sign a 72 hour request for discharge and has refused treatment   She has refused her scheduled medications as well as medication for pain and has refused heat therapy  She also refused to have her blood sugar taken therefore insulin could not be administered  Will continue to monitor and offer treatment

## 2012-02-12 NOTE — Progress Notes (Signed)
Patient ID: Kathleen [name], female   DOB: 1965-06-25, 46 y.o.   MRN: [id number] Upon return from lunch she was very agitated and was given vistaril prn.

## 2012-02-12 NOTE — Progress Notes (Signed)
Patient ID: SHAKIDA KOFF, female   DOB: 12/06/1965, 46 y.o.   MRN: [id number]   Subjective:  The patient is a 46 y/o female with a past psychiatric history significant for PTSD, Bipolar I Disorder, and cocaine abuse. The patient endorses current suicidal ideation. She agrees to work with staff to maintain safety. She reports that she would like to continue a benzodiazepine. She denies any current withdrawal symptoms.    ADL's: Intact Sleep: Poor Appetite: poor Suicidal Ideation:  Endorses suicidal ideation, without  intent, plans or means.   Homicidal Ideation:  No homicidal ideation, intent, plans of means.    Mental Status Examination/Evaluation:  Objective: Appearance: Disheveled   Eye Contact:: Fair   Speech: Normal  Volume: Normal   Mood: depressed and anxious   Affect: Constricted  Thought Process: Disorganized   Orientation: Oriented X 3  Thought Content: Delusions   Suicidal Thoughts: Yes  Homicidal Thoughts: No   Memory: Immediate; Intact Recent; Intact Remote: Intact  Judgement: Impaired   Insight: Lacking   Psychomotor Activity:Normal   Concentration: Fair   Recall: Fair   Akathisia: No   Handed: Right   AIMS (if indicated):   Assets: Communication Skills  Desire for Improvement  Housing  Physical Health  Social Support   Sleep:  Poor    Filed Vitals:   02/11/12 0937 02/11/12 0940 02/12/12 0637 02/12/12 0638  BP:  103/66 84/57 86/51   Pulse: 96 103 67 83  Temp: 98 F (36.7 C)  97.6 F (36.4 C)   TempSrc: Oral     Resp: 16  16   Height: 5\' 4"  (1.626 m)     Weight: 76.204 kg (168 lb)       Current Medications     . citalopram  10 mg Oral Daily  . insulin aspart  0-15 Units Subcutaneous TID WC  . insulin aspart  0-5 Units Subcutaneous QHS  . LORazepam  1 mg Oral QID   Followed by  . LORazepam  1 mg Oral BID   Followed by  . LORazepam  1 mg Oral Daily  . nicotine  21 mg Transdermal Q0600  . QUEtiapine  100 mg Oral TID  . DISCONTD:  QUEtiapine  100 mg Oral QHS  . DISCONTD: QUEtiapine  50 mg Oral BID WC  . DISCONTD: QUEtiapine  50 mg Oral TID  . DISCONTD: traZODone  50 mg Oral QHS,MR X 1   acetaminophen, albuterol, alum & mag hydroxide-simeth, hydrOXYzine, magnesium hydroxide    Lab Results:  No results found for this or any previous visit (from the past 48 hour(s)).   Physical Findings:  AIMS: Facial and Oral Movements  Muscles of Facial Expression: Minimal  Lips and Perioral Area: None, normal  Jaw: None, normal  Tongue: None, normal,Extremity Movements  Upper (arms, wrists, hands, fingers): None, normal  Lower (legs, knees, ankles, toes): None, normal, Trunk Movements  Neck, shoulders, hips: Minimal, Overall Severity  Severity of abnormal movements (highest score from questions above): Minimal  Incapacitation due to abnormal movements: Minimal  Patient's awareness of abnormal movements (rate only patient's report): No Awareness, Dental Status  Current problems with teeth and/or dentures?: No  Does patient usually wear dentures?: No   CIWA: Currently on protocol  Treatment Plan Summary:  1. Daily contact with patient to assess and evaluate symptoms and progress in treatment.  2. Medication management  3. The patient will deny suicidal ideations or homicidal ideations for 48 hours prior to discharge and have a depression  and anxiety rating of 3 or less.The patient will also deny any auditory or visual hallucinations or delusional thinking.  4. The patient will deny any symptoms of substance withdrawal at time of discharge.   Plan:  1. Will change seroquel to 100 mg TID 2. Will Start Celexa 10 mg PO QAM 3. Will Start Vistaril 50 mg TID  4. Laboratory Studies: Reviewed with patient.  5. Will continue to monitor for safety. 6. Plan for discharge next week. Patient will live with mother.

## 2012-02-12 NOTE — Progress Notes (Signed)
Patient ID: Kathleen [name], female   DOB: 1965/10/11, 46 y.o.   MRN: [id number] Patient agitated and asking to be discharged.  Explained to her that she could sign a 72-hour discharge.  She stated that we could not hold her here against her will.  I explained to her that this morning she was asking to be a 1:1 because she did not feel safe and that she wanted to harm herself and that she would not be discharged today.  She requested to speak with the [facility] and I called same.  AC explained to her that there is not a discharge plan in place for her.  She became very distraught over her medications and wanted to speak with the NP tonight.  She was not happy because she has nothing in place to replace her ativan now that her taper is complete.  She has vistaril X3 and she took all of that within a short period of time.  She was not happy with her dose of seroquel or the time that it was ordered.  She also wanted an order of buspar.  AC called MD and got seroquel increased at hs to 200 mg.  Also buspar 3X daily 10 mg.  Patient was told that she would not get any benzos while she was here.  She did not sign a 72 hour discharge.

## 2012-02-12 NOTE — Progress Notes (Signed)
D: Pt came to treatment team, upset about her meds. Treatment team informed pt that the goal is to detox off cocaine. Pt also asked MD if she could be a 1:1, "so I am not alone, I don't feel safe." Md told pt that "we will start her on Vistaril TID PRN, increase the Seroquel, start on Celexa 10 mg, and see if this helps before we were to consider a 1:1."  Pt agreed to this. Pt wants Klonopin, but MD made it very clear that "we were detoxing off benzos and cocaine." A: Pt received Vistaril 50 and Celexa10 at 1020. Med Ed done per RN R: Pt seemed satisfied after talking to MD and treatment team. Pt denies the need for a 1:1 at this time. Safety maintained.
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
## 2012-02-12 NOTE — Progress Notes (Signed)
D: Pt angry this morning, states, "They better do something about my meds, I didn't sleep all night." Pt also refused labs last night and this morning. Labile, poor eye contact, obviously irritated about her meds. Pt received scheduled Ativan and Seroquel this am. A: According to night shift RN, pt seemed to be sleeping all night. Pt reassured that she will be seen today re: her meds. 15 minute checks for safety. R: Pt remains irritable, and wants to see a MD about her meds. Not sure what changes she wants to make at this time. Safety maintained.

## 2012-02-12 NOTE — Treatment Plan (Signed)
Interdisciplinary Treatment Plan Update (Adult)  Date: 02/12/2012  Time Reviewed:9:32 AM  Progress in Treatment:  Attending groups: No Participating in groups: No  Taking medication as prescribed: Yes  Tolerating medication: Yes  Family/Significant other contact made: Currently exploring potential supports. Patient understands diagnosis: Yes, as evidenced by seeking help for mood stabilization, , and auditory hallucinations.  Discussing patient identified problems/goals with staff: Yes See below  Medical problems stabilized or resolved: Yes  Denies suicidal/homicidal ideation: No. Passive SI Issues/concerns per patient self-inventory: Not filled out Other:  New problem(s) identified: N/A  Reason for Continuation of Hospitalization:  Medication management Hallucinations SI and HI Interventions implemented related to continuation of hospitalization: Medication management, encourage group attendance and participation,  Ativan taper for benzo detox Additional comments:  Estimated length of stay:3-4 days  Discharge Plan: Pt will f/u at [facility] and wants to go to [facility].  New goal(s): N/A  Review of initial/current patient goals per problem list:  1. Goal(s):Eliminate SI and HI Met: No Target date: by d/c As evidenced WU:XLKG report in tx team 2. Goal (s): Stabilize mood  Met: No  Target date:by d/c As evidenced by: Pt will rate her depression at a 4 or less  3. Goal(s): Eliminate psychosis Met: No  Target date: by d/c As evidenced [id number] 4. Goal(s): Identify comprehensive mental wellness and sobriety plan. Met: No  Target date: by d/c As evidenced by: Pt exploring [facility] shelter options. Follow-up at [facility]. Needs to be finalized. Attendees:    Patient: Kathleen [name] 02/12/2012 9:32 AM    Family:     Physician: Dr. Lucianne Muss 02/12/2012 9:32 AM   Nursing: Gala Romney., RN 10/10/20139:32 AM   Case Manager: Richelle Ito, LCSW  02/12/2012 9:32 AM   Counselor:  Veto Kemps 10/10/20139:32 AM   Other: Trula Slade, MSW Intern 02/12/2012 9:32AM   Other:     Other:       Other:     Scribe for Treatment Team: Trula Slade, MSW Intern, 10/10/20139:32 AM

## 2012-02-12 NOTE — Progress Notes (Signed)
Psychoeducational Group Note  Date:  02/12/2012 Time:  0930  Group Topic/Focus:  Self Esteem Action Plan:   The focus of this group is to help patients create a plan to continue to build self-esteem after discharge.  Participation Level: Did Not Attend  Participation Quality:  Not Applicable  Affect:  Not Applicable  Cognitive:  Not Applicable  Insight:  Not Applicable  Engagement in Group: Not Applicable  Additional Comments:  Pt refused to attend group stating she did not get any sleep last night and needed to sleep.  Loreley Schwall E 02/12/2012, 1:55 PM

## 2012-02-13 DIAGNOSIS — F39 Unspecified mood [affective] disorder: Principal | ICD-10-CM

## 2012-02-13 LAB — GLUCOSE, CAPILLARY
Glucose-Capillary: 120 mg/dL — ABNORMAL HIGH (ref 70–99)
Glucose-Capillary: 103 mg/dL — ABNORMAL HIGH (ref 70–99)
Glucose-Capillary: 94 mg/dL (ref 70–99)

## 2012-02-13 MED ORDER — QUETIAPINE FUMARATE 300 MG PO TABS: 300.0000 mg | ORAL_TABLET | Freq: Every day | ORAL | Status: DC

## 2012-02-13 MED ORDER — BUSPIRONE HCL 15 MG PO TABS: 15.0000 mg | ORAL_TABLET | Freq: Three times a day (TID) | ORAL | Status: DC

## 2012-02-13 MED ORDER — VITAMIN B-12 100 MCG PO TABS
50.0000 ug | ORAL_TABLET | Freq: Every day | ORAL | Status: DC
  Administered 2012-02-13: 50 ug via ORAL
  Administered 2012-02-14: 12:00:00 via ORAL
  Administered 2012-02-15 – 2012-02-18 (×4): 50 ug via ORAL
  Filled 2012-02-13 (×8): qty 1

## 2012-02-13 MED ORDER — QUETIAPINE FUMARATE 300 MG PO TABS
300.0000 mg | ORAL_TABLET | Freq: Every day | ORAL | Status: DC
  Filled 2012-02-13 (×2): qty 1

## 2012-02-13 MED ORDER — QUETIAPINE FUMARATE 200 MG PO TABS
200.0000 mg | ORAL_TABLET | Freq: Every day | ORAL | Status: DC
  Administered 2012-02-13 – 2012-02-15 (×3): 200 mg via ORAL
  Filled 2012-02-13 (×6): qty 1

## 2012-02-13 MED ORDER — NAPROXEN 500 MG PO TABS
250.0000 mg | ORAL_TABLET | Freq: Two times a day (BID) | ORAL | Status: DC | PRN
  Administered 2012-02-13 – 2012-02-17 (×8): 250 mg via ORAL
  Filled 2012-02-13: qty 2
  Filled 2012-02-13 (×5): qty 1

## 2012-02-13 MED ORDER — QUETIAPINE FUMARATE 100 MG PO TABS
100.0000 mg | ORAL_TABLET | Freq: Two times a day (BID) | ORAL | Status: DC
  Administered 2012-02-13: 100 mg via ORAL
  Filled 2012-02-13 (×4): qty 1

## 2012-02-13 MED ORDER — QUETIAPINE FUMARATE 50 MG PO TABS
75.0000 mg | ORAL_TABLET | Freq: Three times a day (TID) | ORAL | Status: DC
  Administered 2012-02-13 – 2012-02-14 (×3): 75 mg via ORAL
  Filled 2012-02-13: qty 3
  Filled 2012-02-13 (×5): qty 1
  Filled 2012-02-13: qty 3
  Filled 2012-02-13 (×3): qty 1

## 2012-02-13 MED ORDER — BUSPIRONE HCL 10 MG PO TABS
15.0000 mg | ORAL_TABLET | Freq: Three times a day (TID) | ORAL | Status: DC
  Administered 2012-02-13 – 2012-02-18 (×10): 15 mg via ORAL
  Filled 2012-02-13: qty 63
  Filled 2012-02-13 (×2): qty 1
  Filled 2012-02-13: qty 63
  Filled 2012-02-13 (×12): qty 1
  Filled 2012-02-13: qty 63
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
## 2012-02-13 NOTE — Progress Notes (Addendum)
Patient ID: Kathleen [name], female   DOB: 06-02-1965, 46 y.o.   MRN: [id number]   Subjective:  The patient is a 46 y/o female with a past psychiatric history significant for PTSD, Bipolar I Disorder, and cocaine abuse. The patient endorses current suicidal ideation. She agrees to work with staff to maintain safety.  She denies any current withdrawal symptoms.  She reports that she fell yesterday. She denies hitting her head. She reports she feel and hit her hip. She is able to walk today without any problems. She was examined by nursing staff after the unwitnessed fall, no bruising, bleeding, or other issues.   ADL's: Intact Sleep: Poor Appetite: poor  Suicidal Ideation:  Endorses suicidal ideation, without  intent, plans or means.   Homicidal Ideation:  No homicidal ideation, intent, plans of means.    Mental Status Examination/Evaluation:  Objective: Appearance: Disheveled   Eye Contact:: Fair   Speech: Normal  Volume: Normal   Mood: depressed and anxious   Affect: Constricted  Thought Process: Disorganized   Orientation: Oriented X 3  Thought Content: Delusions   Suicidal Thoughts: Yes  Homicidal Thoughts: No   Memory: Immediate; Intact Recent; Intact Remote: Intact  Judgement: Impaired   Insight: Lacking   Psychomotor Activity:Normal   Concentration: Fair   Recall: Fair   Akathisia: No   Handed: Right   AIMS (if indicated):   Assets: Communication Skills  Desire for Improvement  Housing  Physical Health  Social Support   Sleep:  Poor     Filed Vitals:   02/12/12 0638 02/13/12 0700 02/13/12 0701 02/13/12 0800  BP: 86/51 113/77 97/64   Pulse: 83 112  110  Temp:      TempSrc:      Resp:  20    Height:      Weight:        Current Medications: Scheduled Meds:    . busPIRone  10 mg Oral TID  . citalopram  10 mg Oral Daily  . insulin aspart  0-15 Units Subcutaneous TID WC  . insulin aspart  0-5 Units Subcutaneous QHS  . nicotine  21 mg Transdermal  Q0600  . QUEtiapine  100 mg Oral BID  . QUEtiapine  200 mg Oral QHS  . DISCONTD: busPIRone  10 mg Oral TID  . DISCONTD: busPIRone  15 mg Oral TID  . DISCONTD: LORazepam  1 mg Oral BID  . DISCONTD: LORazepam  1 mg Oral Daily  . DISCONTD: QUEtiapine  100 mg Oral TID  . DISCONTD: QUEtiapine  100 mg Oral TID  . DISCONTD: QUEtiapine  100 mg Oral BID  . DISCONTD: QUEtiapine  200 mg Oral QHS  . DISCONTD: QUEtiapine  300 mg Oral QHS   Continuous Infusions:  PRN Meds:.acetaminophen, albuterol, alum & mag hydroxide-simeth, hydrOXYzine, magnesium hydroxide    Lab Results:  No results found for this or any previous visit (from the past 48 hour(s)).   Physical Findings:  AIMS: Facial and Oral Movements  Muscles of Facial Expression: Minimal  Lips and Perioral Area: None, normal  Jaw: None, normal  Tongue: None, normal,Extremity Movements  Upper (arms, wrists, hands, fingers): None, normal  Lower (legs, knees, ankles, toes): None, normal, Trunk Movements  Neck, shoulders, hips: Minimal, Overall Severity  Severity of abnormal movements (highest score from questions above): Minimal  Incapacitation due to abnormal movements: Minimal  Patient's awareness of abnormal movements (rate only patient's report): No Awareness, Dental Status  Current problems with teeth and/or dentures?: No  Does patient usually wear dentures?: No   CIWA: Currently on protocol  Treatment Plan Summary:  1. Daily contact with patient to assess and evaluate symptoms and progress in treatment.  2. Medication management  3. The patient will deny suicidal ideations or homicidal ideations for 48 hours prior to discharge and have a depression and anxiety rating of 3 or less.The patient will also deny any auditory or visual hallucinations or delusional thinking.  4. The patient will deny any symptoms of substance withdrawal at time of discharge.   Plan:  1. Will change seroquel to 75 mg TID and 200 mg QHS 2. Will continue  Celexa 10 mg PO QAM 3. Will  discontinue Vistaril 50 mg TID, and increase Buspar 15 mg TID. 4. Will start Anaproxen 275 BID for moderate pain 5. Will continue to monitor for safety. 6. Laboratory Studies: Reviewed with patient.  7. Plan for discharge next week. Patient will live with mother. Patient is currently calling women's shelters for a place to stay after discharge. She reported previous transportation problems to her appointments.  8. As the patient reports she fell off her bed will have patient examined. 9. Given history of substance abuse will not provide patient with any potentially addictive substances including benzodiazepines.

## 2012-02-13 NOTE — Progress Notes (Addendum)
Psychoeducational Group Note  Date:  02/13/2012 Time:  2000  Group Topic/Focus:  Karaoke night   Participation Level:  Did not attend   Participation Quality:  Did not attend   Affect:  Did not attend   Cognitive:  Did not attend   Insight:  Did not attend   Engagement in Group:  Did not attend   Additional Comments:  Pt didn't attend group this evening.  Ashanti Littles A 02/13/2012, 2:27 AM

## 2012-02-13 NOTE — Progress Notes (Signed)
[facility] Group Notes:  (Counselor/Nursing/MHT/Case Management/Adjunct)  02/13/2012 1:54 PM  Type of Therapy:  Group Therapy  Participation Level:  Did Not Attend    Kathleen [name] 02/13/2012, 1:54 PM

## 2012-02-13 NOTE — Discharge Planning (Signed)
Pt did not attend morning aftercare planning group but came to tx team. Pt stated that she fell last night and wanted to go ER to get checked but was not allowed to go. She was informed that doctor will come to see her today. Pt upset about current medication regimen and said that she feels anxious and agitated. Pt heavily reliant on meds and was encouraged to attend groups.

## 2012-02-13 NOTE — Progress Notes (Signed)
Psychoeducational Group Note  Date:  02/13/2012 Time:  2000  Group Topic/Focus:  Wrap-Up Group:   The focus of this group is to help patients review their daily goal of treatment and discuss progress on daily workbooks.  Participation Level:  Did Not Attend  Participation Quality:  Did not attend   Affect:  Did not attend   Cognitive:  Did not attend   Insight:  Did not attend   Engagement in Group:  Did not attend   Additional Comments:  Pt did not attend wrap up group this evening.   Tenise Stetler A 02/13/2012, 10:10 PM

## 2012-02-13 NOTE — Progress Notes (Signed)
[facility] Group Notes:  (Counselor/Nursing/MHT/Case Management/Adjunct)  02/13/2012 11:31 AM  Type of Therapy:  Group Therapy  02/12/12   Participation Level:  Did Not Attend    Kathleen [name] 02/13/2012, 11:31 AM

## 2012-02-14 DIAGNOSIS — F1410 Cocaine abuse, uncomplicated: Secondary | ICD-10-CM

## 2012-02-14 DIAGNOSIS — F259 Schizoaffective disorder, unspecified: Secondary | ICD-10-CM

## 2012-02-14 LAB — GLUCOSE, CAPILLARY
Glucose-Capillary: 102 mg/dL — ABNORMAL HIGH (ref 70–99)
Glucose-Capillary: 151 mg/dL — ABNORMAL HIGH (ref 70–99)
Glucose-Capillary: 234 mg/dL — ABNORMAL HIGH (ref 70–99)

## 2012-02-14 MED ORDER — CITALOPRAM HYDROBROMIDE 20 MG PO TABS
20.0000 mg | ORAL_TABLET | Freq: Every day | ORAL | Status: DC
  Administered 2012-02-15 – 2012-02-18 (×4): 20 mg via ORAL
  Filled 2012-02-14: qty 14
  Filled 2012-02-14 (×4): qty 1

## 2012-02-14 MED ORDER — QUETIAPINE FUMARATE 200 MG PO TABS
200.0000 mg | ORAL_TABLET | ORAL | Status: DC
  Administered 2012-02-14 – 2012-02-16 (×4): 200 mg via ORAL
  Filled 2012-02-14 (×6): qty 1

## 2012-02-14 MED ORDER — RAMELTEON 8 MG PO TABS
8.0000 mg | ORAL_TABLET | Freq: Every day | ORAL | Status: DC
  Administered 2012-02-14 – 2012-02-17 (×4): 8 mg via ORAL
  Filled 2012-02-14: qty 1
  Filled 2012-02-14: qty 14
  Filled 2012-02-14 (×5): qty 1

## 2012-02-14 MED ORDER — QUETIAPINE FUMARATE 200 MG PO TABS
200.0000 mg | ORAL_TABLET | Freq: Once | ORAL | Status: DC
  Filled 2012-02-14: qty 1

## 2012-02-14 NOTE — Progress Notes (Signed)
[facility] In Patient Progress Note  02/14/2012 3:55 PM Kathleen [name] [birth year] [id number]  Diagnosis:  Axis I: schizoaffective disorder                                Cocaine abuse  ADL's:  Intact  Sleep:  No  Appetite: fair  Suicidal Ideation: No suicidal ideation, no plan, no intent, no means.  Homicidal Ideation:  No homicidal ideation, no plan, no intent, no means.  Subjective:  Kathleen [name] is  Up and active in the unit milieu. She states she is having serious cravings for cocaine. She reports she is dreaming about cocaine and has acted out inorder to leave to use.   height is 5\' 4"  (1.626 m) and weight is 76.204 kg (168 lb). Her oral temperature is 97.5 F (36.4 C). Her blood pressure is 112/76 and her pulse is 90. Her respiration is 16.   Objective: Kathleen [name] seems very genuine in her reports of cravings for cocaine and is requesting help with this. She notes that her seroquel is not helping enough with her anxiety.   Mental Status: Level of Consciousness:  Alert Orientation: x 3 General Appearance : casual Behavior:  cooperative Eye Contact:  Good Motor Behavior:  Normal Speech:  Normal Mood:  Anxious and Depressed Affect:  Constricted Anxiety Level:  Severe Thought Process:  Relevant Thought Content:  WNL Perception:  Hallucinations auditory Judgment:  Fair Insight:  Present Cognition:  At least average Sleep:  Number of Hours: 5.75   Lab Results:  Results for orders placed during the hospital encounter of 02/11/12 (from the past 48 hour(s))  GLUCOSE, CAPILLARY     Status: Normal   Collection Time   02/12/12  5:06 PM      Component Value Range Comment   Glucose-Capillary 94  70 - 99 mg/dL   GLUCOSE, CAPILLARY     Status: Normal   Collection Time   02/13/12  6:12 AM      Component Value Range Comment   Glucose-Capillary 94  70 - 99 mg/dL   GLUCOSE, CAPILLARY     Status: Abnormal   Collection Time   02/13/12 10:56 AM      Component Value Range Comment   Glucose-Capillary 151 (*) 70 - 99 mg/dL    Comment 1 Notify RN     GLUCOSE, CAPILLARY     Status: Abnormal   Collection Time   02/13/12 12:04 PM      Component Value Range Comment   Glucose-Capillary 118 (*) 70 - 99 mg/dL   GLUCOSE, CAPILLARY     Status: Abnormal   Collection Time   02/13/12  4:38 PM      Component Value Range Comment   Glucose-Capillary 138 (*) 70 - 99 mg/dL   GLUCOSE, CAPILLARY     Status: Abnormal   Collection Time   02/13/12  9:01 PM      Component Value Range Comment   Glucose-Capillary 249 (*) 70 - 99 mg/dL    Comment 1 Notify RN     GLUCOSE, CAPILLARY     Status: Abnormal   Collection Time   02/14/12 12:46 AM      Component Value Range Comment   Glucose-Capillary 151 (*) 70 - 99 mg/dL   GLUCOSE, CAPILLARY     Status: Abnormal   Collection Time   02/14/12  6:38 AM      Component Value Range Comment  Glucose-Capillary 102 (*) 70 - 99 mg/dL   GLUCOSE, CAPILLARY     Status: Abnormal   Collection Time   02/14/12 11:38 AM      Component Value Range Comment   Glucose-Capillary 108 (*) 70 - 99 mg/dL    Comment 1 Notify RN      Labs are reviewed. Physical Findings: AIMS: Facial and Oral Movements Muscles of Facial Expression: None, normal Lips and Perioral Area: None, normal Jaw: None, normal Tongue: None, normal,Extremity Movements Upper (arms, wrists, hands, fingers): None, normal Lower (legs, knees, ankles, toes): None, normal, Trunk Movements Neck, shoulders, hips: None, normal, Overall Severity Severity of abnormal movements (highest score from questions above): None, normal Incapacitation due to abnormal movements: None, normal Patient's awareness of abnormal movements (rate only patient's report): No Awareness, Dental Status Current problems with teeth and/or dentures?: No Does patient usually wear dentures?: No  CIWA:    COWS:    Objective: did discuss with Kathleen [name] that if she is not responding any better with this regimen we would consider  switching her to Haldol. Medication:  . busPIRone  15 mg Oral TID  . citalopram  20 mg Oral Daily  . insulin aspart  0-15 Units Subcutaneous TID WC  . insulin aspart  0-5 Units Subcutaneous QHS  . nicotine  21 mg Transdermal Q0600  . QUEtiapine  200 mg Oral QHS  . QUEtiapine  200 mg Oral [id number]  . ramelteon  8 mg Oral QHS  . vitamin B-12  50 mcg Oral Daily  . DISCONTD: citalopram  10 mg Oral Daily  . DISCONTD: QUEtiapine  100 mg Oral BID  . DISCONTD: QUEtiapine  300 mg Oral QHS  . DISCONTD: QUEtiapine  75 mg Oral TID    Treatment Plan Summary: Daily contact with patient to assess and evaluate symptoms and progress in treatment Medication management  Plan: 1. Will change Seroquel to 200 mg at 8AM 4pm and at HS. 2. Will add rozerum for sleep. 3. Will increase the Celexa to 20mg  qd. 4. Will initiate Amantadine 100mg  po BID for cocaine cravings. 5. Will follow. [name]. Jamile Rekowski PAC 02/14/2012, 3:55 PM

## 2012-02-14 NOTE — Progress Notes (Signed)
Psychoeducational Group Note  Date:  02/14/2012 Time:  0945 am  Group Topic/Focus:  Identifying Needs:   The focus of this group is to help patients identify their personal needs that have been historically problematic and identify healthy behaviors to address their needs.  Participation Level:  Active  Participation Quality:  Appropriate  Affect:  Irritable  Cognitive:  Alert  Insight:  Limited  Engagement in Group:  Good  Additional Comments:    Andrena Mews 02/14/2012,11:23 AM
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
## 2012-02-14 NOTE — Progress Notes (Signed)
D   Pt has been labile and irritable   She reports having bad cravings for cocaine   She did attend group and participated and was able to vent and describe how she feels about her addiction and how it affects her life   She can be manipulative at times and argumentative but can be redirected A   Verbal support given  Medications administered and effectiveness monitored  Q 15 min checks  Allowed venting of feelings and discussed alternative coping skills and keeping herself busy R   Pt safe at present and has been a little calmer

## 2012-02-14 NOTE — Progress Notes (Signed)
D: In dayroom, watching TV with peers on approach. Appears blunted and depressed. Calm and cooperative with assessment. Brief with answers and did not forward. No acute distress noted. States she has had a good day, but only thing she could point to was that fact that she lived another day. Otherwise offered no questions or concerns. Denies SI/HI/AVH and contracts for safety.   A: Support and encouragement provided. Safety has been maintained with Q15 minute observation. POC and medications for the shift reviewed and understanding verbalized. Meds given as ordered by MD.    R: Pt remains safe. She is blunted and depressed. She is interacting well with peers. She is complaint with programming and medications. Will continue Q15 minute observation and continue current POC.

## 2012-02-14 NOTE — Progress Notes (Signed)
Patient ID: Kathleen [name], female   DOB: 1965-12-23, 46 y.o.   MRN: [id number] D. The patient spent most of the evening in bed. Did not attend evening wrap up group. After group was over came out of her room to get snack from the dayroom. Was pleasant and cooperative. A. Encouraged to attend evening group. Medications reviewed. CBG was high. Reviewed diabetic diet with the patient and encouraged to make healthy choices when choosing her food. Administered HS medications. R. Did not attend group. Does not adhere to diabetic diet. Compliant with medications.

## 2012-02-15 LAB — GLUCOSE, CAPILLARY: Glucose-Capillary: 148 mg/dL — ABNORMAL HIGH (ref 70–99)

## 2012-02-15 MED ORDER — AMANTADINE HCL 100 MG PO CAPS
100.0000 mg | ORAL_CAPSULE | Freq: Two times a day (BID) | ORAL | Status: DC
  Administered 2012-02-15 – 2012-02-18 (×6): 100 mg via ORAL
  Filled 2012-02-15: qty 1
  Filled 2012-02-15: qty 28
  Filled 2012-02-15: qty 1
  Filled 2012-02-15: qty 28
  Filled 2012-02-15 (×6): qty 1

## 2012-02-15 NOTE — Progress Notes (Signed)
Psychoeducational Group Note  Date:  02/15/2012 Time:  0945 am  Group Topic/Focus:  Making Healthy Choices:   The focus of this group is to help patients identify negative/unhealthy choices they were using prior to admission and identify positive/healthier coping strategies to replace them upon discharge.  Participation Level:  Did Not Attend    Kathleen [name] J 02/15/2012, 10:29 AM  

## 2012-02-15 NOTE — Progress Notes (Signed)
Informed MD PA and charge nurse of 72 hour request being up for this pt today around 8 pm

## 2012-02-15 NOTE — Progress Notes (Signed)
Patient ID: Kathleen [name], female   DOB: 1966/04/19, 46 y.o.   MRN: [id number] [facility] Group Notes:  (Counselor/Nursing/MHT/Case Management/Adjunct)  02/15/2012 11 AM  Type of Therapy:  Aftercare Planning, Group Therapy, Dance/Movement Therapy   Participation Level:  Did Not Attend  Modes of Intervention:  Clarification, Problem-solving, Role-play, Socialization and Support  Summary of Progress/Problems: Pt.did not attend aftercare planning group or counseling group. Pt. accepted information on suicide prevention, warning signs to look for with suicide and crisis line numbers to use. The pt. agreed to call crisis line numbers if having warning signs or having thoughts of suicide.     Debarah Crape  02/15/2012. 11:41 AM

## 2012-02-15 NOTE — Progress Notes (Signed)
Psychoeducational Group Note  Date:  02/15/2012 Time:  2000  Group Topic/Focus:  Wrap-Up Group:   The focus of this group is to help patients review their daily goal of treatment and discuss progress on daily workbooks.  Participation Level:  Active  Participation Quality:  Appropriate  Affect:  Appropriate  Cognitive:  Appropriate  Insight:  Good  Engagement in Group:  Good  Additional Comments:  Patient attended and participated in group tonight. She advised that she had an OK day. She was not feeling well because she started a new medication and it is taking time to adjust to her body. Patient reports that her Vanetta Shawl was her support system  Scot Dock 02/15/2012, 9:14 PM

## 2012-02-15 NOTE — Progress Notes (Signed)
D   Pt has been isolated to her room most of the day  She is still easily agitated but much less irritable than yesterday   She has not attended group   She continues to endorse voices and talk about cravings but has been keeping herself busy with reading the bible and answering trivia questions provided for her A   Verbal support given   Medications administered and effectiveness monitored   Q 15 min checks  Continue to support pts behavior and diversional activity R   Pt safe at present time

## 2012-02-15 NOTE — Progress Notes (Signed)
Psychoeducational Group Note  Date:  02/15/2012 Time:  2000  Group Topic/Focus:  Wrap-Up Group:   The focus of this group is to help patients review their daily goal of treatment and discuss progress on daily workbooks.  Participation Level:  Active  Participation Quality:  Resistant  Affect:  Flat  Cognitive:  Alert  Insight:  Good  Engagement in Group:  None  Additional Comments:  Patient attended but did not participated in group tonight.  Lita Mains [facility] 02/15/2012, 12:04 AM

## 2012-02-16 LAB — GLUCOSE, CAPILLARY
Glucose-Capillary: 124 mg/dL — ABNORMAL HIGH (ref 70–99)
Glucose-Capillary: 98 mg/dL (ref 70–99)

## 2012-02-16 MED ORDER — QUETIAPINE FUMARATE 400 MG PO TABS
400.0000 mg | ORAL_TABLET | Freq: Two times a day (BID) | ORAL | Status: DC
  Administered 2012-02-16 – 2012-02-18 (×4): 400 mg via ORAL
  Filled 2012-02-16 (×4): qty 1
  Filled 2012-02-16: qty 28
  Filled 2012-02-16: qty 1
  Filled 2012-02-16: qty 28

## 2012-02-16 NOTE — Progress Notes (Signed)
D: Patient denies SI/HI/VH. Pt states that she is having AH telling her to "Get out of here."  Patient rates hopelessness as 9,  depression as 9, and anxiety as 9.  Patient affect is anxious. Mood is depressed.  Patient did attend evening group. Patient visible on the milieu. No distress noted. A: Support and encouragement offered. Scheduled medications given to pt. Q 15 min checks continued for patient safety. R: Patient receptive. Patient remains safe on the unit.

## 2012-02-16 NOTE — Progress Notes (Signed)
D: In dayroom, watching TV alone on approach. Appears blunted and depressed. Calm and cooperative with assessment. Brief with responses to assessment questions. No acute distress noted. States she has had an OK day. Expresses some frustration with peers and milieu. States she did attend groups today and learned a lot. However, she was unable to identify anything she learned. She did not share in wrap up group. She denies SI/HI/AVH and contracts for safety. She offered no questions or concerns  A: Support and encouragement provided. Safety has been maintained with Q15 minute observation. POC and medications for the shift reviewed and understanding verbalized. Meds given as ordered by MD. Encouraged to let staff know if she becomes agitated and compelled to confront peer so that they may intervene on her behalf.  R: Pt remains safe. She is calm and cooperative. She is visible on milieu but is generally withdrawn to self. She is complaint with programming and medications. She offers no questions or concerns. Will continue Q15 minute observation and continue current POC

## 2012-02-16 NOTE — Progress Notes (Signed)
D: Patient appropriate and cooperative with staff. Patient's mood is depressed. Passive SI. Denies HI and visual hallucinations. She endorses auditory hallucinations. Patient verbalized that she hears voices telling her "to get out of here" and that she "thought about taking a sheet". Denies pain. Patient verbalized that she slept well, but is very depressed today. A: Support and encouragement provided to patient. Scheduled medications given. CBG and 15 minute checks monitored. R: Patient receptive. Patient remains safe. Contracted for safety.

## 2012-02-16 NOTE — Progress Notes (Signed)
[facility] In Patient Progress Note  02/16/2012 1:20 PM Kathleen [name] 11/15/1965 [id number]  Diagnosis:  Axis I: schizoaffective disorder                                Cocaine abuse  ADL's:  Intact  Sleep:  No  Appetite: fair  Suicidal Ideation: No suicidal ideation, no plan, no intent, no means.  Homicidal Ideation:  No homicidal ideation, no plan, no intent, no means.  Subjective:  Kathleen [name] is up and active in the unit milieu, and ambulating well. She voices no new complaints but states that she is used to taking Seroquel 400 BID.   height is 5\' 4"  (1.626 m) and weight is 76.204 kg (168 lb). Her oral temperature is 98.3 F (36.8 C). Her blood pressure is 124/79 and her pulse is 101. Her respiration is 20.   Objective: She is doing well today and is much calmer. Speech is clear. She states she is afraid that she will return to crack cocaine when she leaves here. Kathleen [name] is much more forthcoming today about her history of crack use. In treatment team this morning Kathleen [name] acknowledged that she is a more frequent user than she has admitted in the past.   Mental Status: Level of Consciousness:  Alert Orientation: x 3 General Appearance : casual Behavior:  cooperative Eye Contact:  Good Motor Behavior:  Normal Speech:  Normal Mood:  Anxious and Depressed Affect:  Constricted Anxiety Level:  severe Thought Process:  Relevant Thought Content:  WNL Perception:  Hallucinations auditory present, but decreasing Judgment:  Fair Insight:  Present Cognition:  At least average Sleep:  Number of Hours: 5.5   Lab Results:  Results for orders placed during the hospital encounter of 02/11/12 (from the past 48 hour(s))  GLUCOSE, CAPILLARY     Status: Normal   Collection Time   02/12/12  5:06 PM      Component Value Range Comment   Glucose-Capillary 94  70 - 99 mg/dL   GLUCOSE, CAPILLARY     Status: Normal   Collection Time   02/13/12  6:12 AM      Component Value Range Comment   Glucose-Capillary 94  70 - 99 mg/dL   GLUCOSE, CAPILLARY     Status: Abnormal   Collection Time   02/13/12 10:56 AM      Component Value Range Comment   Glucose-Capillary 151 (*) 70 - 99 mg/dL    Comment 1 Notify RN     GLUCOSE, CAPILLARY     Status: Abnormal   Collection Time   02/13/12 12:04 PM      Component Value Range Comment   Glucose-Capillary 118 (*) 70 - 99 mg/dL   GLUCOSE, CAPILLARY     Status: Abnormal   Collection Time   02/13/12  4:38 PM      Component Value Range Comment   Glucose-Capillary 138 (*) 70 - 99 mg/dL   GLUCOSE, CAPILLARY     Status: Abnormal   Collection Time   02/13/12  9:01 PM      Component Value Range Comment   Glucose-Capillary 249 (*) 70 - 99 mg/dL    Comment 1 Notify RN     GLUCOSE, CAPILLARY     Status: Abnormal   Collection Time   02/14/12 12:46 AM      Component Value Range Comment   Glucose-Capillary 151 (*) 70 - 99 mg/dL   GLUCOSE,  CAPILLARY     Status: Abnormal   Collection Time   02/14/12  6:38 AM      Component Value Range Comment   Glucose-Capillary 102 (*) 70 - 99 mg/dL   GLUCOSE, CAPILLARY     Status: Abnormal   Collection Time   02/14/12 11:38 AM      Component Value Range Comment   Glucose-Capillary 108 (*) 70 - 99 mg/dL    Comment 1 Notify RN      Labs are reviewed.  ROS: Constitutional: WD WN Adult in NAD ENT:      negative for runny nose, sore throat, congestion, dysphagia COR:     negative for cough, SOB, chest pain, wheezing GI:         negative for Nausea, vomiting, diarrhea, constipation, abdominal pain Neuro:  negative for dizziness, blurred vision, headaches, numbness or tingling Ortho:   negative for limb pain, swelling, change in ambulatory status.  Physical Findings: None AIMS: Facial and Oral Movements Muscles of Facial Expression: None, normal Lips and Perioral Area: None, normal Jaw: None, normal Tongue: None, normal,Extremity Movements Upper (arms, wrists, hands, fingers): None, normal Lower (legs,  knees, ankles, toes): None, normal, Trunk Movements Neck, shoulders, hips: None, normal, Overall Severity Severity of abnormal movements (highest score from questions above): None, normal Incapacitation due to abnormal movements: None, normal Patient's awareness of abnormal movements (rate only patient's report): No Awareness, Dental Status Current problems with teeth and/or dentures?: No Does patient usually wear dentures?: No  CIWA:    COWS:     Medication:  . busPIRone  15 mg Oral TID  . citalopram  20 mg Oral Daily  . insulin aspart  0-15 Units Subcutaneous TID WC  . insulin aspart  0-5 Units Subcutaneous QHS  . nicotine  21 mg Transdermal Q0600  . QUEtiapine  200 mg Oral QHS  . QUEtiapine  200 mg Oral [id number]  . ramelteon  8 mg Oral QHS  . vitamin B-12  50 mcg Oral Daily  . DISCONTD: citalopram  10 mg Oral Daily  . DISCONTD: QUEtiapine  100 mg Oral BID  . DISCONTD: QUEtiapine  300 mg Oral QHS  . DISCONTD: QUEtiapine  75 mg Oral TID    Treatment Plan Summary: 1. Continue for crisis management and stabilization. 2. Medication management to reduce current symptoms to base line and  improve the patient's overall level of functioning 3. Treat health problems as indicated. 4. Develop treatment plan to decrease risk of relapse upon discharge  and the need for readmission. 5. Psycho-social education regarding relapse prevention and self care. 6. Health care follow up as needed for medical problems. 7. Restart home medications where appropriate.   Plan:1. Will increase the seroquel to 400 BID at 8 AM and 8 PM 2. Will continue rozerum for sleep. 3. Will continue the Celexa to 20mg  qd. 4. Will continue Amantadine 100mg  po BID for cocaine cravings. 5. Will follow. 6. Patient will start working with CM to find residential rehab for her cocaine relapse. [name]. [name] PAC 02/16/2012, 1:20 PM

## 2012-02-16 NOTE — Discharge Planning (Signed)
Pareesa and I called [facility] today-no openings in [place] or [place].  I asked about calling other locals, and they gave [place] the number for [facility] in [place].  Sima called them to find out about a bed.  Will follow up with her later today.

## 2012-02-17 LAB — GLUCOSE, CAPILLARY
Glucose-Capillary: 123 mg/dL — ABNORMAL HIGH (ref 70–99)
Glucose-Capillary: 116 mg/dL — ABNORMAL HIGH (ref 70–99)

## 2012-02-17 MED ORDER — QUETIAPINE FUMARATE 400 MG PO TABS: 400.0000 mg | ORAL_TABLET | Freq: Two times a day (BID) | ORAL | Status: DC

## 2012-02-17 MED ORDER — RAMELTEON 8 MG PO TABS: 8.0000 mg | ORAL_TABLET | Freq: Every day | ORAL | Status: DC

## 2012-02-17 MED ORDER — INSULIN ASPART 100 UNIT/ML ~~LOC~~ SOLN: 2.0000 [IU] | Freq: Three times a day (TID) | SUBCUTANEOUS | Status: DC

## 2012-02-17 MED ORDER — CITALOPRAM HYDROBROMIDE 20 MG PO TABS: 20.0000 mg | ORAL_TABLET | Freq: Every day | ORAL | Status: DC

## 2012-02-17 MED ORDER — BUSPIRONE HCL 15 MG PO TABS: 15.0000 mg | ORAL_TABLET | Freq: Three times a day (TID) | ORAL | Status: DC

## 2012-02-17 MED ORDER — AMANTADINE HCL 100 MG PO CAPS: 100.0000 mg | ORAL_CAPSULE | Freq: Two times a day (BID) | ORAL | Status: DC

## 2012-02-17 NOTE — Progress Notes (Signed)
[facility] In Patient Progress Note  02/17/2012 2:40 PM VERL WHITMORE 1965/07/01 [id number]  Diagnosis:  Axis I: schizoaffective disorder                                Cocaine abuse  ADL's:  Intact  Sleep:  No  Appetite: fair  Suicidal Ideation: No suicidal ideation, no plan, no intent, no means.  Homicidal Ideation:  No homicidal ideation, no plan, no intent, no means.  Subjective:  Stephenie is up and active in the unit milieu, and ambulating well.  height is 5\' 4"  (1.626 m) and weight is 76.204 kg (168 lb). Her oral temperature is 98.3 F (36.8 C). Her blood pressure is 123/73 and her pulse is 98. Her respiration is 15.   Objective: Elyza states that she is going to be able to live with her sister until she can go to the residential treatment program at [facility] on Oct. 30th. She will follow up at [facility] in the interim to get her medications Mental Status: Level of Consciousness:  Alert Orientation: x 3 General Appearance : casual Behavior:  cooperative Eye Contact:  Good Motor Behavior:  Normal Speech:  Normal Mood:  anxious Affect:  Constricted Anxiety Level:  severe Thought Process:  Relevant Thought Content:  WNL Perception:  Hallucinations auditory present, but decreasing now at 50% Judgment:  Fair Insight:  Present Cognition:  At least average Sleep:  Number of Hours: 5.5   Lab Results:  Results for orders placed during the hospital encounter of 02/11/12 (from the past 48 hour(s))  GLUCOSE, CAPILLARY     Status: Normal   Collection Time   02/12/12  5:06 PM      Component Value Range Comment   Glucose-Capillary 94  70 - 99 mg/dL   GLUCOSE, CAPILLARY     Status: Normal   Collection Time   02/13/12  6:12 AM      Component Value Range Comment   Glucose-Capillary 94  70 - 99 mg/dL   GLUCOSE, CAPILLARY     Status: Abnormal   Collection Time   02/13/12 10:56 AM      Component Value Range Comment   Glucose-Capillary 151 (*) 70 - 99 mg/dL    Comment 1 Notify RN      GLUCOSE, CAPILLARY     Status: Abnormal   Collection Time   02/13/12 12:04 PM      Component Value Range Comment   Glucose-Capillary 118 (*) 70 - 99 mg/dL   GLUCOSE, CAPILLARY     Status: Abnormal   Collection Time   02/13/12  4:38 PM      Component Value Range Comment   Glucose-Capillary 138 (*) 70 - 99 mg/dL   GLUCOSE, CAPILLARY     Status: Abnormal   Collection Time   02/13/12  9:01 PM      Component Value Range Comment   Glucose-Capillary 249 (*) 70 - 99 mg/dL    Comment 1 Notify RN     GLUCOSE, CAPILLARY     Status: Abnormal   Collection Time   02/14/12 12:46 AM      Component Value Range Comment   Glucose-Capillary 151 (*) 70 - 99 mg/dL   GLUCOSE, CAPILLARY     Status: Abnormal   Collection Time   02/14/12  6:38 AM      Component Value Range Comment   Glucose-Capillary 102 (*) 70 - 99 mg/dL  GLUCOSE, CAPILLARY     Status: Abnormal   Collection Time   02/14/12 11:38 AM      Component Value Range Comment   Glucose-Capillary 108 (*) 70 - 99 mg/dL    Comment 1 Notify RN      Labs are reviewed.  ROS: Constitutional: WD WN Adult in NAD ENT:      negative for runny nose, sore throat, congestion, dysphagia COR:     negative for cough, SOB, chest pain, wheezing GI:         negative for Nausea, vomiting, diarrhea, constipation, abdominal pain Neuro:  negative for dizziness, blurred vision, headaches, numbness or tingling Ortho:   negative for limb pain, swelling, change in ambulatory status.  Physical Findings: None AIMS: Facial and Oral Movements Muscles of Facial Expression: None, normal Lips and Perioral Area: None, normal Jaw: None, normal Tongue: None, normal,Extremity Movements Upper (arms, wrists, hands, fingers): None, normal Lower (legs, knees, ankles, toes): None, normal, Trunk Movements Neck, shoulders, hips: None, normal, Overall Severity Severity of abnormal movements (highest score from questions above): None, normal Incapacitation due to abnormal  movements: None, normal Patient's awareness of abnormal movements (rate only patient's report): No Awareness, Dental Status Current problems with teeth and/or dentures?: No Does patient usually wear dentures?: No  CIWA:    COWS:     Medication:  . busPIRone  15 mg Oral TID  . citalopram  20 mg Oral Daily  . insulin aspart  0-15 Units Subcutaneous TID WC  . insulin aspart  0-5 Units Subcutaneous QHS  . nicotine  21 mg Transdermal Q0600  . QUEtiapine  200 mg Oral QHS  . QUEtiapine  200 mg Oral [id number]  . ramelteon  8 mg Oral QHS  . vitamin B-12  50 mcg Oral Daily  . DISCONTD: citalopram  10 mg Oral Daily  . DISCONTD: QUEtiapine  100 mg Oral BID  . DISCONTD: QUEtiapine  300 mg Oral QHS  . DISCONTD: QUEtiapine  75 mg Oral TID    Treatment Plan Summary: 1. Continue for crisis management and stabilization. 2. Medication management to reduce current symptoms to base line and  improve the patient's overall level of functioning 3. Treat health problems as indicated. 4. Develop treatment plan to decrease risk of relapse upon discharge  and the need for readmission. 5. Psycho-social education regarding relapse prevention and self care. 6. Health care follow up as needed for medical problems. 7. Restart home medications where appropriate.   Plan: 1. Will continue the seroquel to 400 BID at 8 AM and 8 PM 2. Will continue rozerum for sleep. 3. Will continue the Celexa to 20mg  qd. 4. Will continue Amantadine 100mg  po BID for cocaine cravings. 5. Will follow. 6.Will prepare discharge medications and AVS today for d/c tomorrow. [name]. Allianna Beaubien PAC 02/17/2012, 2:40 PM

## 2012-02-17 NOTE — Progress Notes (Signed)
D: Patient attended group today. Cooperative to staff and pleasant to others on the milieu. Patient denies SI/HI but continues to present with auditory hallucinations. She stated "the voices are telling me to go home". Denies any visual hallucinations. Patient stated that she feels depress 6/10 d/t being discharged in the morning. Patient stated anxiety 9/10, hopelessness 9/10. Patient refused 8 am Buspar medication stating "I don't feel anything when I take it". Patient did take scheduled dose of Buspar at 1400. Patient continues to be depressed at times. A: Continue to encourage patient to attend groups. Verbal support given. 15 minute checks performed routinely for safety. Medications administered per MD as ordered per MD. R: Patient remains safe. Depressed.

## 2012-02-17 NOTE — Progress Notes (Addendum)
Psychoeducational Group Note  Date:  02/16/2012 Time:  2020  Group Topic/Focus:  Wrap-Up Group:   The focus of this group is to help patients review their daily goal of treatment and discuss progress on daily workbooks.  Participation Level:  Minimal  Participation Quality:  Attentive and Resistant  Affect:  Depressed  Cognitive:  Oriented  Insight:  Limited  Engagement in Group:  Limited  Additional Comments: Patient attended wrap-up group this evening but did not want to share even a name.   Jersey Ravenscroft, Newton Pigg 02/17/2012, 3:17 AM

## 2012-02-17 NOTE — Progress Notes (Signed)
Psychoeducational Group Note  Date:  02/17/2012 Time:  2000  Group Topic/Focus:  Wrap-Up Group:   The focus of this group is to help patients review their daily goal of treatment and discuss progress on daily workbooks.  Participation Level:  Active  Participation Quality:  Sharing and Supportive  Affect:  Appropriate  Cognitive:  Appropriate  Insight:  Good  Engagement in Group:  Good  Additional Comments:  Patient shared that she had a good day today and that she was looking forward to discharge.  Foy Vanduyne, Newton Pigg 02/17/2012, 10:36 PM

## 2012-02-17 NOTE — Treatment Plan (Signed)
Interdisciplinary Treatment Plan Update (Adult)  Date: 02/17/2012  Time Reviewed: 10:47 AM   Progress in Treatment: Attending groups: Yes Participating in groups: Yes Taking medication as prescribed: Yes Tolerating medication: Yes   Family/Significant other contact made:   Patient understands diagnosis:  Yes Discussing patient identified problems/goals with staff:  Yes Medical problems stabilized or resolved:  Yes Denies suicidal/homicidal ideation: Yes  In tx team Issues/concerns per patient self-inventory:  Not filled out Other:  New problem(s) identified: N/A  Reason for Continuation of Hospitalization: Medication stabilization  Interventions implemented related to continuation of hospitalization: Groups/therapeutic milieu  Additional comments:CM to call mother with Kathleen [name] to talk about need for transportation to appointments. Estimated length of stay: D/C tomorrow  Discharge Plan: see below  New goal(s): N/A  Review of initial/current patient goals per problem list:   1.  Goal(s): Eliminate SI and HI  Met:  Yes  Target date:10/15  As evidenced NW:GNFA report  2.  Goal (s): Stabilize mood  Met:  Yes  Target date:10/15  As evidenced OZ:HYQMV denies depression, hopelessness and anxiety today   3.  Goal(s):Eliminate psychosis  Met:  Yes  Target date:10/15  As evidenced by: self report, observation   4.  Goal(s): Identify comprehensive sobriety and mental wellness plan  Met:  Yes  Target date:10/15  As evidenced HQ:IONG report-Kathleen [name] states she will stay with her sister where she will remain clean and sober and go to [facility] in anticipation of a bed at [facility] rehab on 10/30  Attendees: Patient: Kathleen [name] 02/17/2012 10:47 AM  Family:     Physician: Geoffery Lyons 02/17/2012 10:47 AM   Nursing:  Garen Lah  02/17/2012 10:47 AM   Case Manager:  Richelle Ito,  02/17/2012 10:47 AM   Counselor:  Veto Kemps 02/17/2012 10:47 AM   Other:       Other:     Other:     Other:      Scribe for Treatment Team:   Ida Rogue, 02/17/2012 10:47 AM

## 2012-02-17 NOTE — Progress Notes (Signed)
[facility] Group Notes:  (Counselor/Nursing/MHT/Case Management/Adjunct)  02/17/2012 2:22 PM  Type of Therapy:  Group Therapy 02/16/12  Participation Level:  Did Not Attend    Kathleen [name] 02/17/2012, 2:22 PM

## 2012-02-17 NOTE — Discharge Planning (Signed)
Kariya updated me with info. Of no beds in [facility] with [facility].  Asked if she could get into [facility] rehab again.  She was there in the spring of this year, but admitted to being d/ced early for breaking confidentiality.  I called Roe Coombs who agreed to give her a screening date of Oct 30.  Bethanny states she will stay with her sister, and mother will give her a ride to [facility].  Will follow up with [facility] in the meantime.
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
## 2012-02-17 NOTE — Progress Notes (Signed)
[facility] Case Management Discharge Plan:  Will you be returning to the same living situation after discharge: Yes,  with sister or No. At discharge, do you have transportation home?:Yes,  mother Do you have the ability to pay for your medications:Yes,  mental health  Interagency Information:     Release of information consent forms completed and in the chart;  Patient's signature needed at discharge.  Patient to Follow up at:  Follow-up Information    Follow up with [facility]. (Walk-in between 8am and 9am Monday thru Friday  This is where you will see the Dr and get meds for your admission to [facility].)    Contact information:   [name], [place] [address] [phone]      Follow up with [facility] rehab. On 03/03/2012. (Arrive at [facility] for your screening for admission.  If you can, go next week between 8 and 9 on morning to fill out your admission paperwork ahead of time.)    Contact information:   [address]  [phone]         Patient denies SI/HI:   Yes,  yes    Safety Planning and Suicide Prevention discussed:  Yes,  yes  Barrier to discharge identified:No.  Summary and Recommendations:   Kathleen [name] 02/17/2012, 10:07 AM

## 2012-02-17 NOTE — Progress Notes (Signed)
[facility] Group Notes:  (Counselor/Nursing/MHT/Case Management/Adjunct)  02/17/2012 2:22 PM  Type of Therapy:  Group Therapy  Participation Level:  None  Summary of Progress/Problems: Patient came in at the last of group. She was attentive but did not initiate discussion.   Patryck Kilgore, Aram Beecham 02/17/2012, 2:22 PM

## 2012-02-17 NOTE — Progress Notes (Signed)
D: Patient states she had an ok day.  Patient appears flat and depressed.  Patient states she was not happy that her room changed.  Patient states she has been attending all of her groups.  Patient states she will be discharged tomorrow.  Patient denies SI/HI but state she is still having auditory hallucinations stating the voices tell her to go home but states she does not listen to the voices. A: Staff to monitor Q 15 mins for safety.  Encouragement and support offered.  Scheduled medications administered per orders.   R: Patient remains safe on the unit.  Patient attended group tonight. Patient calm and cooperative on the unit.  Patient interacting and taking administered medications per orders.

## 2012-02-18 LAB — GLUCOSE, CAPILLARY: Glucose-Capillary: 101 mg/dL — ABNORMAL HIGH (ref 70–99)

## 2012-02-18 NOTE — Progress Notes (Signed)
[facility] Family/Significant Other Suicide Prevention Education  Suicide Prevention Education:  Education Completed; Brandt Loosen (mother) has been identified by the patient as the family member/significant other, and identified as the person(s) who will aid the patient in the event of a mental health crisis (suicidal ideations/suicide attempt).  With written consent from the patient, the family member/significant other has been provided the following suicide prevention education, prior to the and/or following the discharge of the patient.  The suicide prevention education provided includes the following:  Suicide risk factors  Suicide prevention and interventions  National Suicide Hotline telephone number  [facility] assessment telephone number  [facility] Emergency Assistance 911  [facility] and/or Residential Mobile Crisis Unit telephone number  Request made of family/significant other to:  Remove weapons (e.g., guns, rifles, knives), all items previously/currently identified as safety concern.    Remove drugs/medications (over-the-counter, prescriptions, illicit drugs), all items previously/currently identified as a safety concern.  The family member/significant other verbalizes understanding of the suicide prevention education information provided.  The family member/significant other agrees to remove the items of safety concern listed above.  Talked to patient's mother in the lobby while she was waiting for discharge. She stated that she hopes patient will do better. She stated that she has been dealing with this for years. She stated that she is 46 but often acts like age 46. She stated her other children were doing fine. Doesn't understand why patient keeps messing up. Mother plans to provide transportation to her treatment.  Wadell Craddock 02/18/2012, 1:25 PM

## 2012-02-18 NOTE — Discharge Summary (Signed)
Physician Discharge Summary Note  Patient:  Kathleen [name] is an 46 y.o., female MRN:  [id number] DOB:  March 22, 1966 Patient phone:  [phone] (home)  Patient address:   [address] [place] [address]   Date of Admission:  02/11/2012 Date of Discharge: 02/18/2012 Length of Stay:7 Discharge Diagnoses: Principal Problem:  *Cocaine abuse  Axis Diagnosis:  Discharge Diagnoses:  AXIS I: Mood Disorder NOS, Post Traumatic Stress Disorder and Substance Abuse  AXIS II: Deferred  AXIS III:  Past Medical History   Diagnosis  Date   .  Diabetes mellitus    .  Asthma    .  Headache    .  Seizures    AXIS IV: economic problems, housing problems and occupational problems  AXIS V: 51-60 moderate symptoms   Level of Care:  [facility]  Hospital Course:      The duration of stay was 7 days.      The patient was seen and evaluated by the Treatment team consisting of Psychiatrist, PAC, RN, Case Manager, and Therapist for evaluation and treatment plan with goal of stabilization upon discharge. The patient's physical and mental health problems were identified and treated appropriately.      Multiple modalities of treatment were used including medication, individual and group therapies, unit programming, improved nutrition, physical activity, and family sessions as needed.     The symptoms of anxiety, depression, cocaine cravings, and psychosis were monitored daily by evaluation by clinical provider.  The patient's mental and emotional status was evaluated by a daily self inventory completed by the patient.      Improvement was demonstrated by declining numbers on the self assessment, improving vital signs, increased cognition, and improvement in mood, sleep, appetite as well as a reduction in physical symptoms.       The patient was evaluated and found to be stable enough for discharge and was released to her sister's house per the initial plan of treatment.   Mental Status Exam:  For mental  status exam please see mental status exam and  suicide risk assessment completed by attending physician prior to discharge.   Medication List   As of 02/18/2012 11:52 AM  STOP taking these medications     ALPRAZolam 1 MG tablet   Commonly known as: XANAX      QUEtiapine 50 MG Tb24   Commonly known as: SEROQUEL XR      TAKE these medications      Indication    albuterol 108 (90 BASE) MCG/ACT inhaler   Commonly known as: PROVENTIL HFA;VENTOLIN HFA   Inhale 2 puffs into the lungs every 4 (four) hours as needed for wheezing or shortness of breath.       amantadine 100 MG capsule   Commonly known as: SYMMETREL   Take 1 capsule (100 mg total) by mouth 2 (two) times daily. For cocaine cravings.       busPIRone 15 MG tablet   Commonly known as: BUSPAR   Take 1 tablet (15 mg total) by mouth 3 (three) times daily. For anxiety.    Indication: Anxiety Disorder, Symptoms of Feeling Anxious      citalopram 20 MG tablet   Commonly known as: CELEXA   Take 1 tablet (20 mg total) by mouth daily. For anxiety and depression.    Indication: Depression      insulin aspart 100 UNIT/ML injection   Commonly known as: novoLOG   Inject 2 Units into the skin 3 (three) times  daily before meals. Sliding scale as directed for glycemic control.    Indication: Type 2 Diabetes      QUEtiapine 400 MG tablet   Commonly known as: SEROQUEL   Take 1 tablet (400 mg total) by mouth 2 (two) times daily. For psychosis, anxiety and depression.       ramelteon 8 MG tablet   Commonly known as: ROZEREM   Take 1 tablet (8 mg total) by mouth at bedtime. For insomnia.    Indication: Trouble Sleeping       They were also enrolled in group counseling sessions and activities in which they participated actively.  Upon discharge, patient adamantly denies suicidal, homicidal ideations, auditory, visual hallucinations and or delusional thinking. They left [facility] with all personal belongings via private personal transportation  in no apparent distress.  Consults:  none  Significant Diagnostic Studies:  Labs:   Discharge Vitals:   Blood pressure 123/73, pulse 98, temperature 98.3 F (36.8 C), temperature source Oral, resp. rate 15, height 5\' 4"  (1.626 m), weight 76.204 kg (168 lb), last menstrual period 12/29/2011..  Mental Status Exam: See Mental Status Examination and Suicide Risk Assessment completed by Attending Physician prior to discharge.  Discharge destination:  To sister's house then to RTC.  Is patient on multiple antipsychotic therapies at discharge:  No  Has Patient had three or more failed trials of antipsychotic monotherapy by history: N/A Recommended Plan for Multiple Antipsychotic Therapies: N/A Discharge Orders    Future Orders Please Complete By Expires   Diet - low sodium heart healthy      Increase activity slowly      Discharge instructions      Comments:   Take all of your medications as prescribed.  Be sure to keep ALL follow up appointments as scheduled. This is to ensure getting your refills on time to avoid any interruption in your medication.  If you find that you can not keep your appointment, call the clinic and reschedule. Be sure to tell the nurse if you will need a refill before your appointment.       Medication List     As of 02/18/2012 11:52 AM    STOP taking these medications         ALPRAZolam 1 MG tablet   Commonly known as: XANAX      QUEtiapine 50 MG Tb24   Commonly known as: SEROQUEL XR      TAKE these medications      Indication    albuterol 108 (90 BASE) MCG/ACT inhaler   Commonly known as: PROVENTIL HFA;VENTOLIN HFA   Inhale 2 puffs into the lungs every 4 (four) hours as needed for wheezing or shortness of breath.       amantadine 100 MG capsule   Commonly known as: SYMMETREL   Take 1 capsule (100 mg total) by mouth 2 (two) times daily. For cocaine cravings.       busPIRone 15 MG tablet   Commonly known as: BUSPAR   Take 1 tablet (15 mg total)  by mouth 3 (three) times daily. For anxiety.    Indication: Anxiety Disorder, Symptoms of Feeling Anxious      citalopram 20 MG tablet   Commonly known as: CELEXA   Take 1 tablet (20 mg total) by mouth daily. For anxiety and depression.    Indication: Depression      insulin aspart 100 UNIT/ML injection   Commonly known as: novoLOG   Inject 2 Units into the skin 3 (  three) times daily before meals. Sliding scale as directed for glycemic control.    Indication: Type 2 Diabetes      QUEtiapine 400 MG tablet   Commonly known as: SEROQUEL   Take 1 tablet (400 mg total) by mouth 2 (two) times daily. For psychosis, anxiety and depression.       ramelteon 8 MG tablet   Commonly known as: ROZEREM   Take 1 tablet (8 mg total) by mouth at bedtime. For insomnia.    Indication: Trouble Sleeping           Follow-up Information    Follow up with [facility]. (Walk-in between 8am and 9am Monday thru Friday  This is where you will see the Dr and get meds for your admission to [facility].)    Contact information:   [address], [place] [address] [phone]      Follow up with [facility] rehab. On 03/03/2012. (Arrive at [facility] for your screening for admission.  If you can, go next week between 8 and 9 on morning to fill out your admission paperwork ahead of time.)    Contact information:   [address] [facility]  [phone]        Follow-up recommendations:   Activities: Resume typical activities Diet: Resume typical diet  Other: Follow up with outpatient provider and report any side effects to out patient prescriber.  Comments:  Take all your medications as prescribed by your mental healthcare provider. Report any adverse effects and or reactions from your medicines to your outpatient provider promptly. Patient is instructed and cautioned to not engage in alcohol and or illegal drug use while on prescription medicines. In the event of worsening symptoms, patient is instructed to  call the crisis hotline, 911 and or go to the nearest ED for appropriate evaluation and treatment of symptoms. Follow-up with your primary care provider for your other medical issues, concerns and or health care needs.  Signed:  [name]. Magdiel Bartles [facility] 02/18/2012 11:52 AM

## 2012-02-18 NOTE — Progress Notes (Signed)
I have read the above note and agree with those findings. Madie Reno A. Dub Mikes, M.D.

## 2012-02-18 NOTE — Progress Notes (Signed)
Patient ID: Kathleen [name], female   DOB: 12/07/1965, 46 y.o.   MRN: [id number] Discharge Note: Patient discharged to mother. Patient received both verbal and written discharge instructions. Patient received sample medications. Patient received all belonging from room and locker. Patient disposed of a bag of belts in search room. Patient denied SI/HI/AVH. Patient denies pain and show no s/s of distress. Patient agreed to f/u appointments and medication regimen. Patient was cooperative when leaving [facility].

## 2012-02-20 NOTE — Progress Notes (Signed)
Patient Discharge Instructions:  After Visit Summary (AVS):   Faxed to:  02/20/2012 Psychiatric Admission Assessment Note:   Faxed to:  02/20/2012 Suicide Risk Assessment - Discharge Assessment:   Faxed to:  02/20/2012 Faxed/Sent to the Next Level Care provider:  02/20/2012  Faxed to [facility] @ [phone] And to [facility] @ [phone]  Wandra Scot, 02/20/2012, 4:26 PM

## 2012-03-03 NOTE — Discharge Summary (Signed)
Agree with assessment and plan 

## 2012-03-17 ENCOUNTER — Emergency Department (HOSPITAL_COMMUNITY)
Admission: EM | Admit: 2012-03-17 | Discharge: 2012-03-18 | Disposition: A | Discharge status: Home / Self Care | Payer: Self-pay | Attending: Emergency Medicine | Admitting: Emergency Medicine

## 2012-03-17 ENCOUNTER — Encounter (HOSPITAL_COMMUNITY): Payer: Self-pay | Admitting: *Deleted

## 2012-03-17 DIAGNOSIS — F319 Bipolar disorder, unspecified: Secondary | ICD-10-CM | POA: Insufficient documentation

## 2012-03-17 DIAGNOSIS — E119 Type 2 diabetes mellitus without complications: Secondary | ICD-10-CM | POA: Insufficient documentation

## 2012-03-17 DIAGNOSIS — F4310 Post-traumatic stress disorder, unspecified: Secondary | ICD-10-CM | POA: Insufficient documentation

## 2012-03-17 DIAGNOSIS — T1491XA Suicide attempt, initial encounter: Secondary | ICD-10-CM

## 2012-03-17 DIAGNOSIS — Z8669 Personal history of other diseases of the nervous system and sense organs: Secondary | ICD-10-CM | POA: Insufficient documentation

## 2012-03-17 DIAGNOSIS — F411 Generalized anxiety disorder: Secondary | ICD-10-CM | POA: Insufficient documentation

## 2012-03-17 DIAGNOSIS — F17200 Nicotine dependence, unspecified, uncomplicated: Secondary | ICD-10-CM | POA: Insufficient documentation

## 2012-03-17 DIAGNOSIS — T43502A Poisoning by unspecified antipsychotics and neuroleptics, intentional self-harm, initial encounter: Secondary | ICD-10-CM | POA: Insufficient documentation

## 2012-03-17 DIAGNOSIS — J45909 Unspecified asthma, uncomplicated: Secondary | ICD-10-CM | POA: Insufficient documentation

## 2012-03-17 DIAGNOSIS — T43501A Poisoning by unspecified antipsychotics and neuroleptics, accidental (unintentional), initial encounter: Secondary | ICD-10-CM | POA: Insufficient documentation

## 2012-03-17 DIAGNOSIS — T50901A Poisoning by unspecified drugs, medicaments and biological substances, accidental (unintentional), initial encounter: Secondary | ICD-10-CM

## 2012-03-17 LAB — RAPID URINE DRUG SCREEN, HOSP PERFORMED
Amphetamines: NOT DETECTED
Barbiturates: NOT DETECTED
Opiates: NOT DETECTED
Tetrahydrocannabinol: NOT DETECTED

## 2012-03-17 LAB — COMPREHENSIVE METABOLIC PANEL
ALT: 22 U/L (ref 0–35)
AST: 23 U/L (ref 0–37)
CO2: 24 mEq/L (ref 19–32)
Calcium: 9.8 mg/dL (ref 8.4–10.5)
Chloride: 99 mEq/L (ref 96–112)
GFR calc Af Amer: 85 mL/min — ABNORMAL LOW (ref >90)
GFR calc non Af Amer: 73 mL/min — ABNORMAL LOW (ref >90)
Glucose, Bld: 92 mg/dL (ref 70–99)
Sodium: 138 mEq/L (ref 135–145)
Total Bilirubin: 0.4 mg/dL (ref 0.3–1.2)

## 2012-03-17 LAB — GLUCOSE, CAPILLARY
Glucose-Capillary: 76 mg/dL (ref 70–99)
Glucose-Capillary: 97 mg/dL (ref 70–99)

## 2012-03-17 LAB — CBC
Hemoglobin: 12.8 g/dL (ref 12.0–15.0)
MCH: 25 pg — ABNORMAL LOW (ref 26.0–34.0)
MCV: 71.5 fL — ABNORMAL LOW (ref 78.0–100.0)
Platelets: 279 10*3/uL (ref 150–400)
RBC: 5.12 MIL/uL — ABNORMAL HIGH (ref 3.87–5.11)
WBC: 9 10*3/uL (ref 4.0–10.5)

## 2012-03-17 MED ORDER — INSULIN ASPART 100 UNIT/ML ~~LOC~~ SOLN: 0.0000 [IU] | Freq: Every day | SUBCUTANEOUS | Status: DC

## 2012-03-17 MED ORDER — INSULIN ASPART 100 UNIT/ML ~~LOC~~ SOLN: 0.0000 [IU] | Freq: Three times a day (TID) | SUBCUTANEOUS | Status: DC

## 2012-03-17 MED ORDER — AMANTADINE HCL 100 MG PO CAPS
100.0000 mg | ORAL_CAPSULE | Freq: Two times a day (BID) | ORAL | Status: DC
  Administered 2012-03-17 – 2012-03-18 (×2): 100 mg via ORAL
  Filled 2012-03-17 (×3): qty 1

## 2012-03-17 MED ORDER — RAMELTEON 8 MG PO TABS
8.0000 mg | ORAL_TABLET | Freq: Every day | ORAL | Status: DC
  Administered 2012-03-17: 8 mg via ORAL
  Filled 2012-03-17 (×2): qty 1

## 2012-03-17 MED ORDER — BUSPIRONE HCL 10 MG PO TABS
10.0000 mg | ORAL_TABLET | Freq: Three times a day (TID) | ORAL | Status: DC
Start: 2012-03-17 — End: 2012-03-18
  Administered 2012-03-17 – 2012-03-18 (×3): 10 mg via ORAL
  Filled 2012-03-17 (×3): qty 1

## 2012-03-17 MED ORDER — SODIUM CHLORIDE 0.9 % IV SOLN
Freq: Once | INTRAVENOUS | Status: AC
  Administered 2012-03-17: 11:00:00 via INTRAVENOUS

## 2012-03-17 MED ORDER — CITALOPRAM HYDROBROMIDE 20 MG PO TABS
20.0000 mg | ORAL_TABLET | Freq: Every day | ORAL | Status: DC
  Administered 2012-03-17 – 2012-03-18 (×2): 20 mg via ORAL
  Filled 2012-03-17 (×3): qty 1

## 2012-03-17 MED ORDER — LORAZEPAM 1 MG PO TABS
1.0000 mg | ORAL_TABLET | Freq: Once | ORAL | Status: AC
  Administered 2012-03-17: 1 mg via ORAL
  Filled 2012-03-17: qty 1

## 2012-03-17 MED ORDER — NALOXONE HCL 0.4 MG/ML IJ SOLN
0.4000 mg | Freq: Once | INTRAMUSCULAR | Status: AC
  Administered 2012-03-17: 0.4 mg via INTRAVENOUS
  Filled 2012-03-17: qty 1

## 2012-03-17 NOTE — ED Notes (Signed)
Sitter present at bedside. Pt sleeping.  

## 2012-03-17 NOTE — ED Notes (Signed)
Patient was in to wand patient and searched all three belongings bag.

## 2012-03-17 NOTE — ED Notes (Signed)
Spoke with poison control, advised to keep pt on cardiac monitor until heart rate is in low 90's .

## 2012-03-17 NOTE — ED Notes (Signed)
Report given to asia, rn

## 2012-03-17 NOTE — ED Notes (Signed)
md made aware pt requesting more pain medication

## 2012-03-17 NOTE — ED Notes (Signed)
Bedside report received from previous RN 

## 2012-03-17 NOTE — ED Provider Notes (Signed)
History     CSN: [id number]  Arrival date & time 03/17/12  0915   First MD Initiated Contact with Patient 03/17/12 [phone]      Chief Complaint  Patient presents with  . Suicidal  level 5 caveat due to altered mental status.  (Consider location/radiation/quality/duration/timing/severity/associated sxs/prior treatment) The history is provided by the patient.  patient was brought in by EMS after an overdose. Patient reportedly took 4 tablets of her Seroquel around 8:15 this morning in order to kill her self. She she reportedly took them because her boyfriend broke up with her. Patient states her eyes would not stay open. Patient will arouse to stimulation but will then have her mental status decreased again.  Past Medical History  Diagnosis Date  . Anxiety   . Post traumatic stress disorder   . Diabetes mellitus   . Schizoaffective disorder, bipolar type   . Asthma   . Headache   . Seizures     2 weeks ago and blacked out     History reviewed. No pertinent past surgical history.  History reviewed. No pertinent family history.  History  Substance Use Topics  . Smoking status: Current Every Day Smoker -- 1.0 packs/day for 30 years    Types: Cigarettes  . Smokeless tobacco: Not on file  . Alcohol Use: No     Comment: Pt denies    OB History    Grav Para Term Preterm Abortions TAB SAB Ect Mult Living                  Review of Systems  Unable to perform ROS: Mental status change    Allergies  Peach flavor  Home Medications   Current Outpatient Rx  Name  Route  Sig  Dispense  Refill  . ALBUTEROL SULFATE HFA 108 (90 BASE) MCG/ACT IN AERS   Inhalation   Inhale 2 puffs into the lungs every 6 (six) hours as needed. shortness of breath         . AMANTADINE HCL 100 MG PO CAPS   Oral   Take 100 mg by mouth 2 (two) times daily.         . BUSPIRONE HCL 10 MG PO TABS   Oral   Take 10 mg by mouth 3 (three) times daily.         Marland Kitchen CITALOPRAM HYDROBROMIDE 20 MG  PO TABS   Oral   Take 20 mg by mouth daily.         . INSULIN ASPART 100 UNIT/ML [place] SOLN   Subcutaneous   Inject into the skin 3 (three) times daily before meals. Sliding scale         . QUETIAPINE FUMARATE 400 MG PO TABS   Oral   Take 400 mg by mouth 2 (two) times daily.         Marland Kitchen RAMELTEON 8 MG PO TABS   Oral   Take 8 mg by mouth at bedtime.           BP 118/72  Pulse 100  Temp 97.7 F (36.5 C) (Oral)  Resp 12  SpO2 99%  Physical Exam  Constitutional: She appears well-developed.  HENT:  Head: Normocephalic and atraumatic.  Eyes:       Pupils are constricted bilaterally.  Cardiovascular: Normal rate and regular rhythm.   Pulmonary/Chest: Effort normal and breath sounds normal.  Abdominal: Soft. There is no tenderness.  Neurological:       Patient is sedate. She will  wake up to stimulation. She states that her eyes will not stay open. She will follow some commands. She does go back to her decreased mental status without stimulation    ED Course  Procedures (including critical care time)  Labs Reviewed  CBC - Abnormal; Notable for the following:    RBC 5.12 (*)     MCV 71.5 (*)     MCH 25.0 (*)     RDW 16.4 (*)     All other components within normal limits  COMPREHENSIVE METABOLIC PANEL - Abnormal; Notable for the following:    Total Protein 8.4 (*)     GFR calc non Af Amer 73 (*)     GFR calc Af Amer 85 (*)     All other components within normal limits  SALICYLATE LEVEL - Abnormal; Notable for the following:    Salicylate Lvl <2.0 (*)     All other components within normal limits  URINE RAPID DRUG SCREEN (HOSP PERFORMED) - Abnormal; Notable for the following:    Cocaine POSITIVE (*)     All other components within normal limits  ACETAMINOPHEN LEVEL  ETHANOL  POCT PREGNANCY, URINE   No results found.   1. Overdose   2. Suicide attempt      Date: 03/17/2012  Rate: 101  Rhythm: sinus tachycardia  QRS Axis: normal  Intervals: QT prolonged  and QTc 493  ST/T Wave abnormalities: normal  Conduction Disutrbances:none  Narrative Interpretation:   Old EKG Reviewed: none available    MDM  Patient overdosed on her own Seroquel. She did around 8 this morning. She is likely medically cleared at this time. It has been discussed with poison control. She'll be seen by ACT team.        Juliet Rude. Rubin Payor, MD 03/17/12 1623

## 2012-03-17 NOTE — Progress Notes (Signed)
CSW attempted to complete [facility] assessment, however pt was unarousable and sleeping heavily. CSW/ACT will attempt to assess later.   Catha Gosselin, LCSWA  [phone] .03/17/2012 9:19

## 2012-03-17 NOTE — ED Notes (Addendum)
Per ems pt is from home. Pt is alert, but lethargic. Pt able to move herself up in bed and help changing clothes. Pt reports she took 4 tablets of her seroquel around 0815 because she wanted to kill herself, due to "her boyfriend broke up with her". Pt is lethargic but arouseable. Boyfriend stated pt came over last night "because she had no where to stay".  Boyfriend reported pt has not been drinking.    Poison control called Want to watch qt prolongation. Watch pt for 4-6 hours, goal pt become more alert and oriented. Monitor. ekg performed. Rule out acetaminophen. Poison control will called back around 1330. Kristie from poison control.  At 1150 rn called poison control to alert them of pts drug screen and testing positive for cocaine. kristie from poison control stated to monitor pt closely and will check back later with rn.

## 2012-03-17 NOTE — Progress Notes (Signed)
45 female no pcp listed self pay coverage Cm spoke with pt to confirm no pcp Cm discussed and provided written information for list of [place] self pay pcps, indigent resources, DSS, health department, medication assistance and financial assistance Pt voiced understanding and appreciation of resources/services offered

## 2012-03-17 NOTE — ED Notes (Signed)
Received pt in rm 30, pt is asleep but arousable, pt denies any pain at this time VSS.  Pt placed on cardiac monitor.
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
## 2012-03-17 NOTE — ED Notes (Signed)
RUE:[id number]<JW> Expected date:<BR> Expected time:<BR> Means of arrival:<BR> Comments:<BR> OD

## 2012-03-18 LAB — GLUCOSE, CAPILLARY
Glucose-Capillary: 87 mg/dL (ref 70–99)
Glucose-Capillary: 105 mg/dL — ABNORMAL HIGH (ref 70–99)

## 2012-03-18 NOTE — Care Management ED Note (Signed)
       CARE MANAGEMENT ED NOTE 03/17/2012  Patient:  Kathleen [name], Kathleen [name]   Account Number:  [id number]  Date Initiated:  03/17/2012  Documentation initiated by:  Edd Arbour  Subjective/Objective Assessment:     Subjective/Objective Assessment Detail:   23 female no pcp listed self pay coverage Cm spoke with pt to confirm no pcp Cm discussed and provided written information for list of [place] self pay pcps, indigent resources, DSS, health department, medication assistance and financial assistance Pt voiced understanding and appreciation of resources/services offered     Action/Plan:   Action/Plan Detail:   Anticipated DC Date:  03/18/2012     Status Recommendation to Physician:   Result of Recommendation:    Other ED Services  Consult Working Plan    DC Planning Services  PCP issues  Other    Choice offered to / List presented to:            Status of service:  Completed, signed off  ED Comments:   ED Comments Detail:
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
## 2012-03-18 NOTE — ED Provider Notes (Signed)
PSYCH consult obtained: Dr Jacky Kindle recommends OK for discharge home, Dx Unspecified Drug Dependence. Refer PT to [facility] and [employer].   ACT provided referrals.   PT stable for d/c home.  Sunnie Nielsen, MD 03/18/12 [phone]

## 2012-03-18 NOTE — ED Notes (Signed)
Pt had 6 beat run of VTach. Strip printed. Will continue to monitor.

## 2012-03-18 NOTE — ED Notes (Signed)
Telepsych in progress. 

## 2012-05-03 ENCOUNTER — Telehealth (HOSPITAL_COMMUNITY): Payer: Self-pay | Admitting: *Deleted

## 2012-05-03 NOTE — Telephone Encounter (Signed)
Patient is requesting letter in support of disability. States that Verne Spurr and Dr.Kumar were her only doctors during admission to [facility] in October 2013. States she met with them every day.  She states she needs this letter to support her in court when she goes for disability. Informed her MD will be in office on Thursday 05/06/12 and would be consulted then.

## 2012-05-10 ENCOUNTER — Telehealth (HOSPITAL_COMMUNITY): Payer: Self-pay | Admitting: *Deleted

## 2012-05-10 NOTE — Telephone Encounter (Signed)
See note from phone call on 05/03/12. Dr.Kumar informed on her return to office of Pt's request for letter for support for disability on 05/06/12. Per Dr.Kumar, pt will need to have current provider write a letter of support. Inpatient information can be obtained thru medical records. Attempted to contact pt 05/06/12 @ 1531 and 1706, heard only busy signal each time. Pt contact office today, again requesting update on letter. Contacted pt at [phone], left message with Dr.Kumar's statement that she cannot provide this letter and recommended she contact her current provider.

## 2012-05-18 ENCOUNTER — Telehealth (HOSPITAL_COMMUNITY): Payer: Self-pay | Admitting: *Deleted

## 2012-05-18 NOTE — Telephone Encounter (Signed)
See note from phone call on 05/03/12 and 2012-06-07. Patient states death in family. Did not receive any previous messages. Dr.Kumar informed on her return to office of Pt's request for letter for support for disability on 05/06/12.  Per Dr.Kumar, pt will need to have current provider write a letter of support. Inpatient information can be obtained thru medical records.  Attempted to contact pt 05/06/12 @ 1531 and 1706, heard only busy signal each time.  Pt contact office today, again requesting update on letter.  Contacted pt at [phone],informed of Dr.Kumar's statement that she cannot provide this letter and recommended she contact her current provider. Patient verbalized understanding of information.

## 2012-10-07 ENCOUNTER — Encounter (HOSPITAL_COMMUNITY): Payer: Self-pay | Admitting: Emergency Medicine

## 2012-10-07 ENCOUNTER — Emergency Department (HOSPITAL_COMMUNITY)
Admission: EM | Admit: 2012-10-07 | Discharge: 2012-10-08 | Disposition: A | Discharge status: Home / Self Care | Payer: MEDICAID | Attending: Emergency Medicine | Admitting: Emergency Medicine

## 2012-10-07 DIAGNOSIS — Z8669 Personal history of other diseases of the nervous system and sense organs: Secondary | ICD-10-CM | POA: Insufficient documentation

## 2012-10-07 DIAGNOSIS — F411 Generalized anxiety disorder: Secondary | ICD-10-CM | POA: Insufficient documentation

## 2012-10-07 DIAGNOSIS — Z8659 Personal history of other mental and behavioral disorders: Secondary | ICD-10-CM | POA: Insufficient documentation

## 2012-10-07 DIAGNOSIS — F259 Schizoaffective disorder, unspecified: Secondary | ICD-10-CM | POA: Insufficient documentation

## 2012-10-07 DIAGNOSIS — F1410 Cocaine abuse, uncomplicated: Secondary | ICD-10-CM | POA: Insufficient documentation

## 2012-10-07 DIAGNOSIS — J45909 Unspecified asthma, uncomplicated: Secondary | ICD-10-CM | POA: Insufficient documentation

## 2012-10-07 DIAGNOSIS — Z79899 Other long term (current) drug therapy: Secondary | ICD-10-CM | POA: Insufficient documentation

## 2012-10-07 DIAGNOSIS — F17200 Nicotine dependence, unspecified, uncomplicated: Secondary | ICD-10-CM | POA: Insufficient documentation

## 2012-10-07 DIAGNOSIS — E119 Type 2 diabetes mellitus without complications: Secondary | ICD-10-CM | POA: Insufficient documentation

## 2012-10-07 DIAGNOSIS — Z3202 Encounter for pregnancy test, result negative: Secondary | ICD-10-CM | POA: Insufficient documentation

## 2012-10-07 DIAGNOSIS — Z794 Long term (current) use of insulin: Secondary | ICD-10-CM | POA: Insufficient documentation

## 2012-10-07 HISTORY — DX: Cocaine use, unspecified with other cocaine-induced disorder: F14.988

## 2012-10-07 LAB — BASIC METABOLIC PANEL
BUN: 10 mg/dL (ref 6–23)
Chloride: 100 mEq/L (ref 96–112)
GFR calc Af Amer: 80 mL/min — ABNORMAL LOW (ref >90)
Potassium: 4 mEq/L (ref 3.5–5.1)

## 2012-10-07 LAB — CBC WITH DIFFERENTIAL/PLATELET
HCT: 36.9 % (ref 36.0–46.0)
Hemoglobin: 13.1 g/dL (ref 12.0–15.0)
Lymphocytes Relative: 27 % (ref 12–46)
Lymphs Abs: 2.9 10*3/uL (ref 0.7–4.0)
MCHC: 35.5 g/dL (ref 30.0–36.0)
Monocytes Absolute: 0.6 10*3/uL (ref 0.1–1.0)
Monocytes Relative: 6 % (ref 3–12)
Neutro Abs: 7.2 10*3/uL (ref 1.7–7.7)
WBC: 10.7 10*3/uL — ABNORMAL HIGH (ref 4.0–10.5)

## 2012-10-07 LAB — RAPID URINE DRUG SCREEN, HOSP PERFORMED
Opiates: NOT DETECTED
Tetrahydrocannabinol: NOT DETECTED

## 2012-10-07 LAB — GLUCOSE, CAPILLARY: Glucose-Capillary: 92 mg/dL (ref 70–99)

## 2012-10-07 LAB — URINE MICROSCOPIC-ADD ON

## 2012-10-07 LAB — URINALYSIS, ROUTINE W REFLEX MICROSCOPIC
Bilirubin Urine: NEGATIVE
Glucose, UA: NEGATIVE mg/dL
Specific Gravity, Urine: >1.03 — ABNORMAL HIGH (ref 1.005–1.030)
pH: 5.5 (ref 5.0–8.0)

## 2012-10-07 MED ORDER — HALOPERIDOL LACTATE 5 MG/ML IJ SOLN
5.0000 mg | Freq: Once | INTRAMUSCULAR | Status: AC
  Administered 2012-10-07: 5 mg via INTRAMUSCULAR
  Filled 2012-10-07: qty 1
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
## 2012-10-07 NOTE — ED Notes (Signed)
Pt states she had argument with her aunt this morning and "said some things I didn't mean". Pt was advised by [facility] to come here for evaluation due to remarks made toward aunt during their argument. Pt denies SI/HI. Pt schizophrenia and states she has not been taking her meds recently because she cannot afford them.

## 2012-10-08 LAB — URINE CULTURE

## 2012-10-08 NOTE — ED Notes (Signed)
Pt was told that [facility] would be giving her a ride home, but now they call to report they will be unable to take pt due to short staffing of [facility]

## 2012-12-09 IMAGING — CR DG CHEST 2V
2 series · 2 of 2 positions shown · non-contrast
Comparison: 02/23/2010

CLINICAL DATA: Shortness of breath.

CHEST - 2 VIEW

[w chest pa]
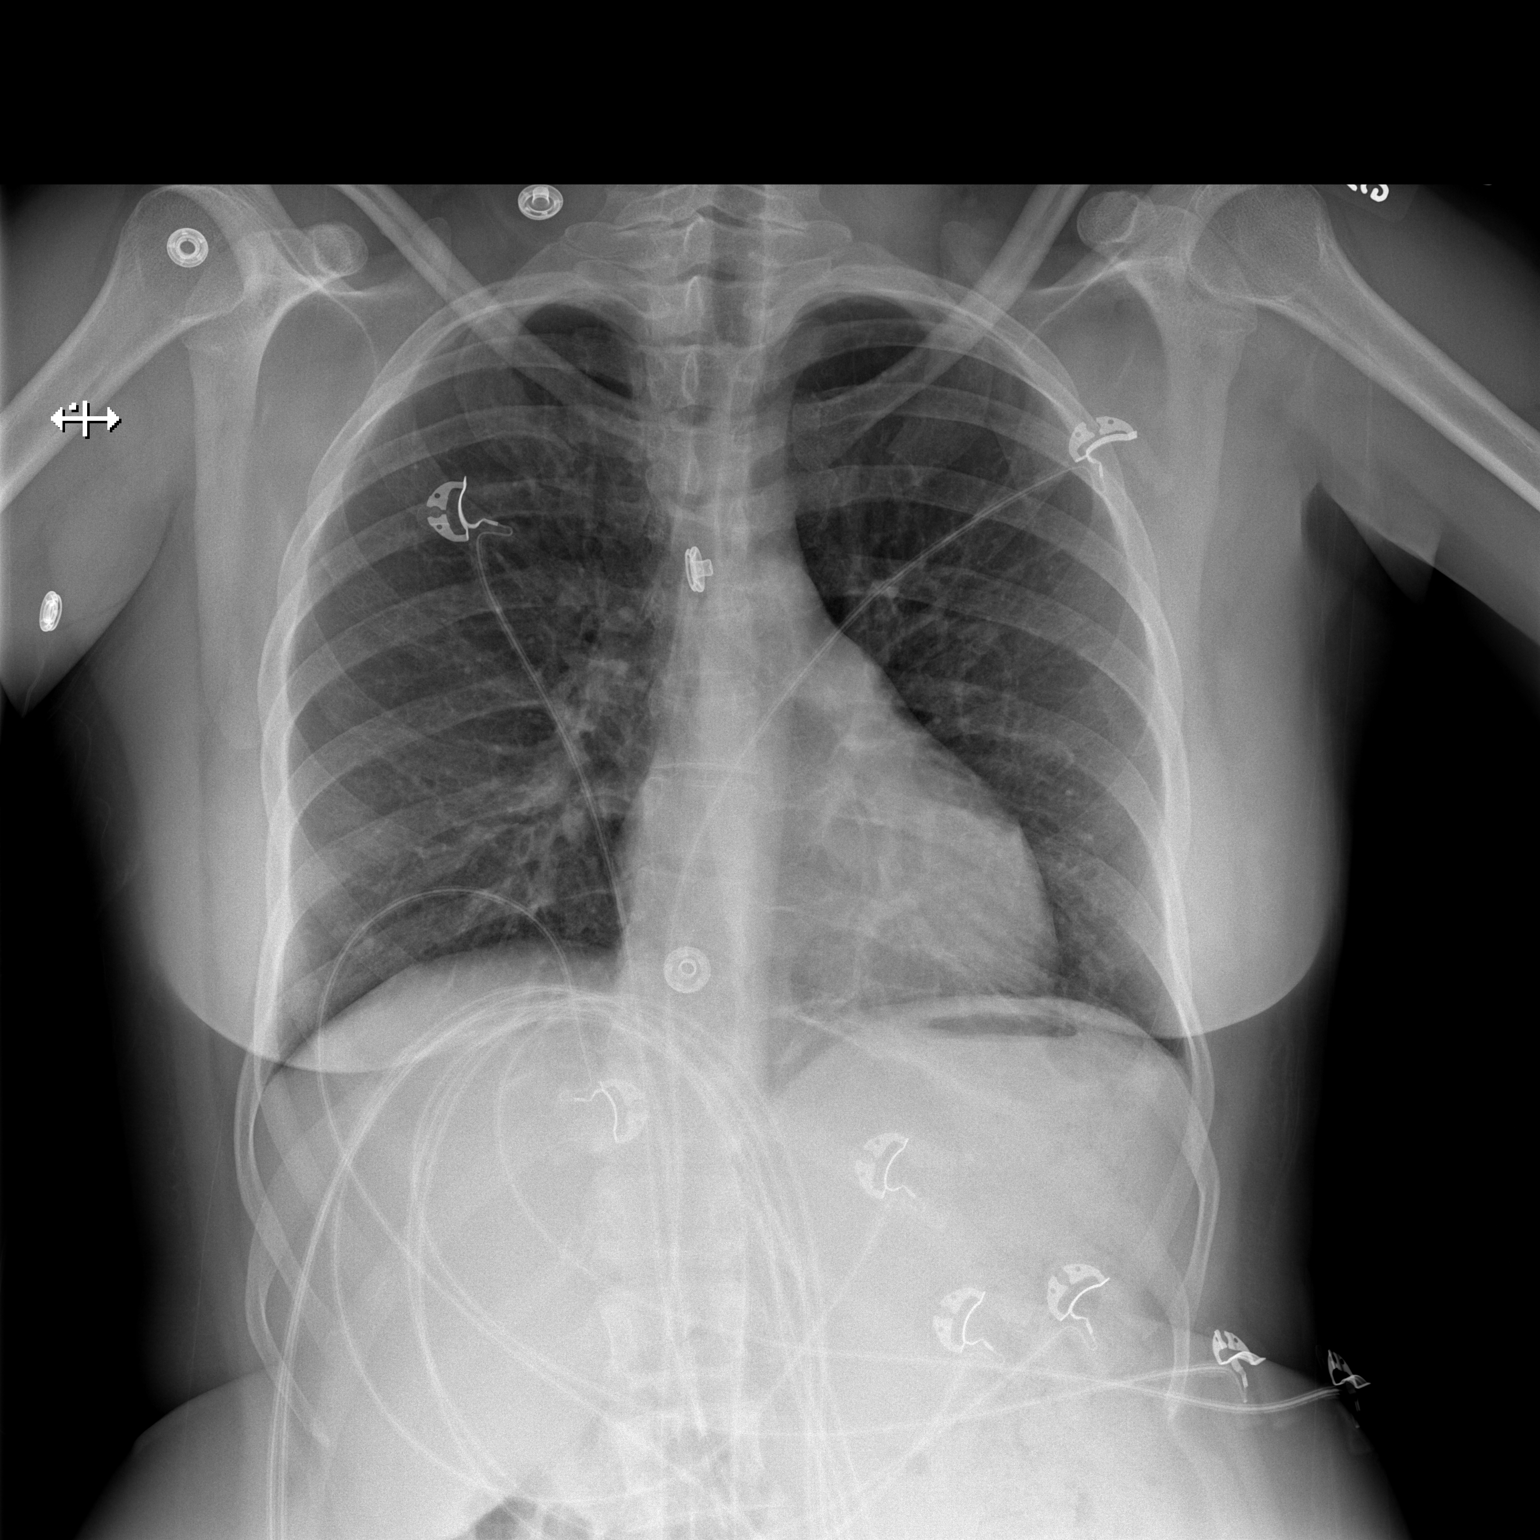

[w chest lat]
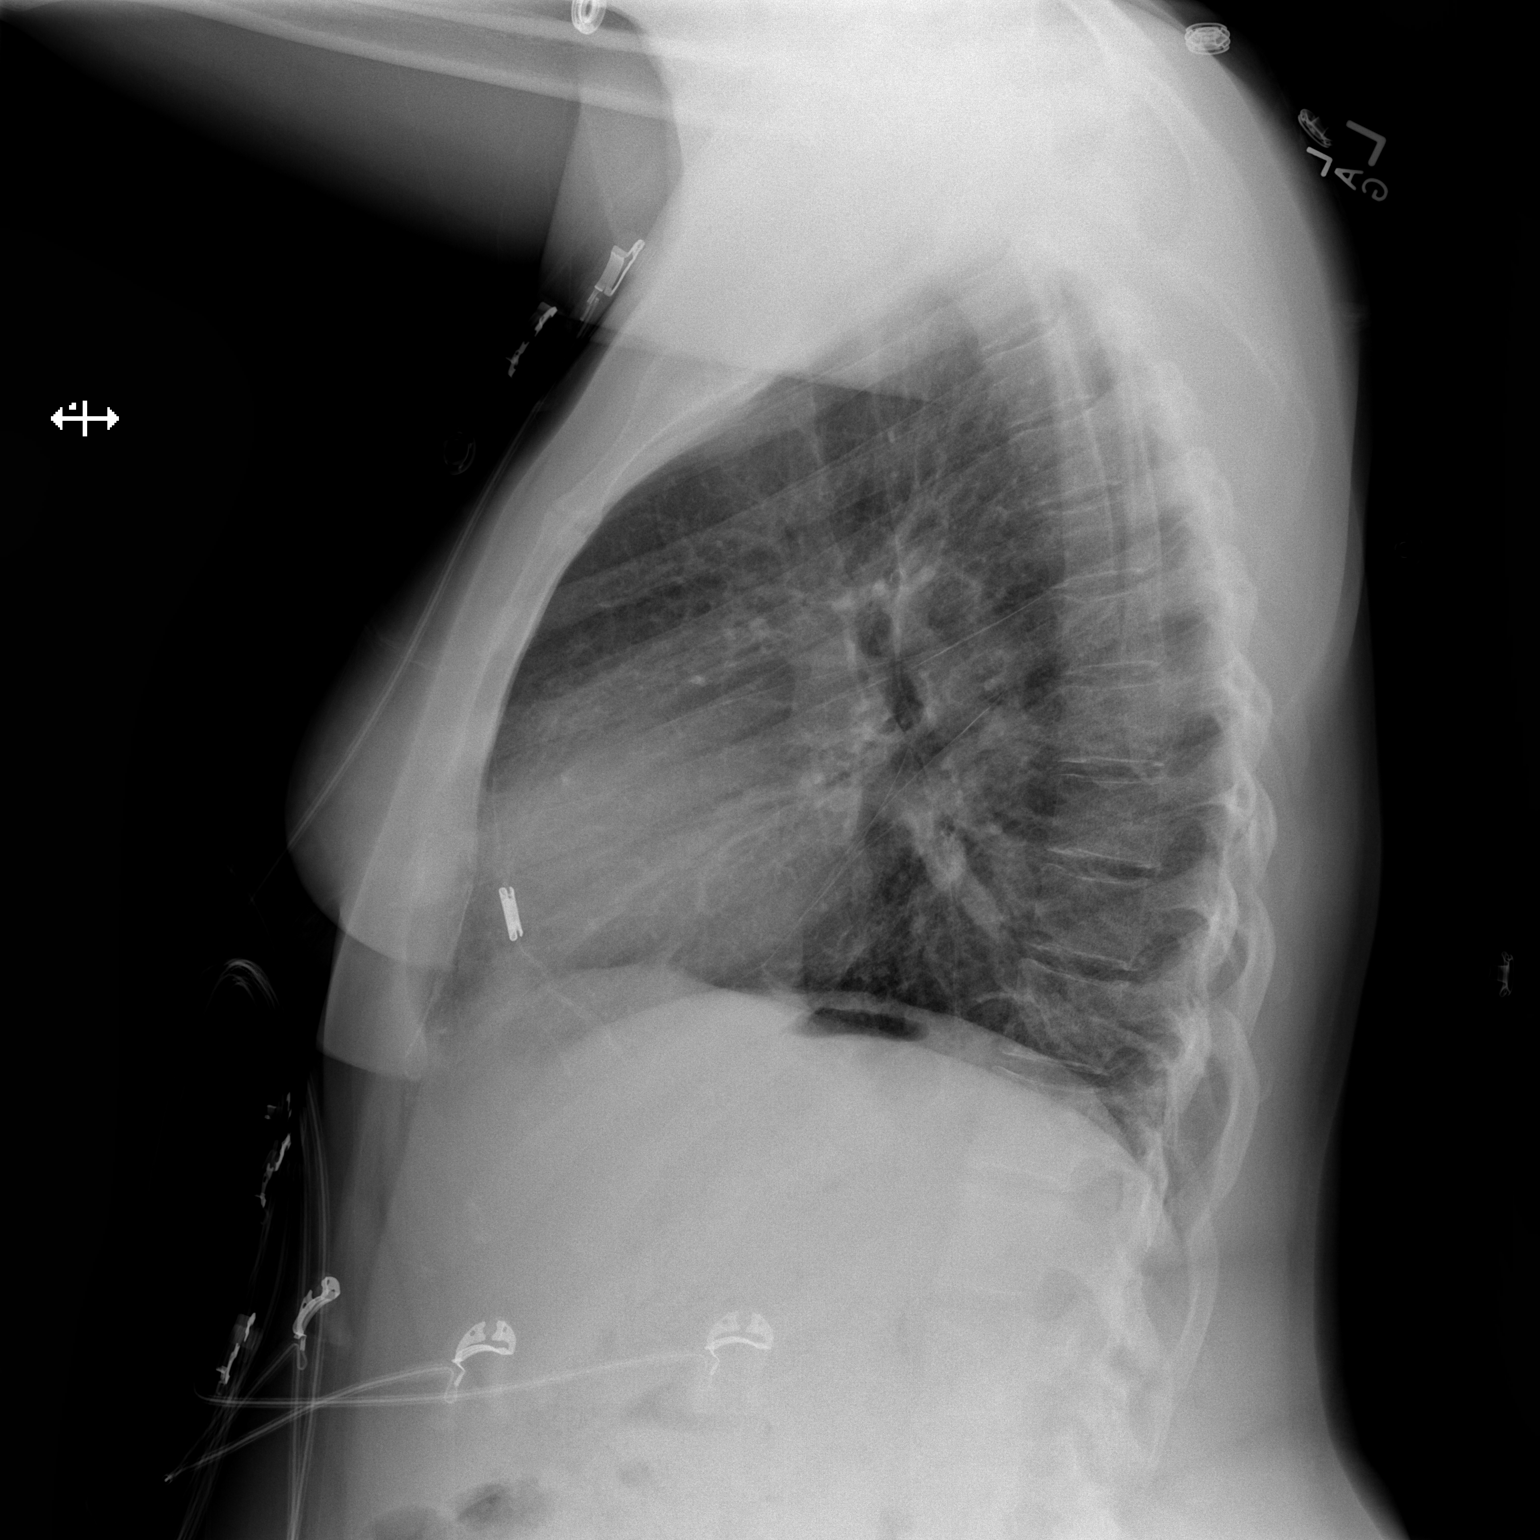

[2 of 2 positions shown; findings below may reference images not displayed]

FINDINGS: Two views of the chest were obtained.  The lungs are
clear without airspace disease or effusions.  Heart size is within
normal limits.  Trachea is midline.
IMPRESSION: No acute chest findings.

## 2013-01-03 ENCOUNTER — Encounter (HOSPITAL_COMMUNITY): Payer: Self-pay | Admitting: *Deleted

## 2013-01-03 ENCOUNTER — Inpatient Hospital Stay (HOSPITAL_COMMUNITY)
Admission: EM | Admit: 2013-01-03 | Discharge: 2013-01-07 | DRG: 191 | Payer: Medicaid Other | Attending: Internal Medicine | Admitting: Internal Medicine

## 2013-01-03 DIAGNOSIS — F411 Generalized anxiety disorder: Secondary | ICD-10-CM | POA: Diagnosis present

## 2013-01-03 DIAGNOSIS — F1410 Cocaine abuse, uncomplicated: Secondary | ICD-10-CM

## 2013-01-03 DIAGNOSIS — E1165 Type 2 diabetes mellitus with hyperglycemia: Secondary | ICD-10-CM | POA: Diagnosis present

## 2013-01-03 DIAGNOSIS — F259 Schizoaffective disorder, unspecified: Secondary | ICD-10-CM | POA: Diagnosis present

## 2013-01-03 DIAGNOSIS — IMO0002 Reserved for concepts with insufficient information to code with codable children: Secondary | ICD-10-CM

## 2013-01-03 DIAGNOSIS — I959 Hypotension, unspecified: Secondary | ICD-10-CM | POA: Diagnosis not present

## 2013-01-03 DIAGNOSIS — J209 Acute bronchitis, unspecified: Principal | ICD-10-CM | POA: Diagnosis present

## 2013-01-03 DIAGNOSIS — R45851 Suicidal ideations: Secondary | ICD-10-CM

## 2013-01-03 DIAGNOSIS — F17200 Nicotine dependence, unspecified, uncomplicated: Secondary | ICD-10-CM | POA: Diagnosis present

## 2013-01-03 DIAGNOSIS — G40909 Epilepsy, unspecified, not intractable, without status epilepticus: Secondary | ICD-10-CM | POA: Diagnosis present

## 2013-01-03 DIAGNOSIS — F3111 Bipolar disorder, current episode manic without psychotic features, mild: Secondary | ICD-10-CM

## 2013-01-03 DIAGNOSIS — J440 Chronic obstructive pulmonary disease with acute lower respiratory infection: Principal | ICD-10-CM | POA: Diagnosis present

## 2013-01-03 DIAGNOSIS — R45850 Homicidal ideations: Secondary | ICD-10-CM

## 2013-01-03 DIAGNOSIS — Z794 Long term (current) use of insulin: Secondary | ICD-10-CM

## 2013-01-03 DIAGNOSIS — R569 Unspecified convulsions: Secondary | ICD-10-CM

## 2013-01-03 DIAGNOSIS — IMO0001 Reserved for inherently not codable concepts without codable children: Secondary | ICD-10-CM | POA: Diagnosis present

## 2013-01-03 DIAGNOSIS — J9819 Other pulmonary collapse: Secondary | ICD-10-CM | POA: Diagnosis present

## 2013-01-03 DIAGNOSIS — F4310 Post-traumatic stress disorder, unspecified: Secondary | ICD-10-CM | POA: Diagnosis present

## 2013-01-03 DIAGNOSIS — J189 Pneumonia, unspecified organism: Secondary | ICD-10-CM

## 2013-01-03 DIAGNOSIS — F14159 Cocaine abuse with cocaine-induced psychotic disorder, unspecified: Secondary | ICD-10-CM

## 2013-01-03 HISTORY — DX: Unspecified convulsions: R56.9

## 2013-01-03 HISTORY — DX: Chronic obstructive pulmonary disease, unspecified: J44.9

## 2013-01-03 LAB — CBC
MCH: 25 pg — ABNORMAL LOW (ref 26.0–34.0)
Platelets: 246 10*3/uL (ref 150–400)
RBC: 5.11 MIL/uL (ref 3.87–5.11)
RDW: 16.3 % — ABNORMAL HIGH (ref 11.5–15.5)
WBC: 10.2 10*3/uL (ref 4.0–10.5)

## 2013-01-03 LAB — COMPREHENSIVE METABOLIC PANEL
ALT: 24 U/L (ref 0–35)
AST: 17 U/L (ref 0–37)
Albumin: 3.8 g/dL (ref 3.5–5.2)
CO2: 25 mEq/L (ref 19–32)
Calcium: 9.4 mg/dL (ref 8.4–10.5)
Chloride: 101 mEq/L (ref 96–112)
GFR calc non Af Amer: 60 mL/min — ABNORMAL LOW (ref >90)
Sodium: 137 mEq/L (ref 135–145)

## 2013-01-03 LAB — SALICYLATE LEVEL: Salicylate Lvl: <2 mg/dL — ABNORMAL LOW (ref 2.8–20.0)

## 2013-01-03 MED ORDER — CHLORDIAZEPOXIDE HCL 25 MG PO CAPS
50.0000 mg | ORAL_CAPSULE | Freq: Three times a day (TID) | ORAL | Status: DC
  Administered 2013-01-03 (×2): 50 mg via ORAL
  Filled 2013-01-03 (×2): qty 2

## 2013-01-03 MED ORDER — NICOTINE 21 MG/24HR TD PT24
21.0000 mg | MEDICATED_PATCH | Freq: Every day | TRANSDERMAL | Status: DC
  Administered 2013-01-03 – 2013-01-07 (×5): 21 mg via TRANSDERMAL
  Filled 2013-01-03 (×7): qty 1

## 2013-01-03 MED ORDER — LORAZEPAM 1 MG PO TABS
1.0000 mg | ORAL_TABLET | Freq: Three times a day (TID) | ORAL | Status: DC | PRN
  Administered 2013-01-03 – 2013-01-06 (×3): 1 mg via ORAL
  Filled 2013-01-03 (×3): qty 1

## 2013-01-03 MED ORDER — QUETIAPINE FUMARATE 400 MG PO TABS
400.0000 mg | ORAL_TABLET | Freq: Two times a day (BID) | ORAL | Status: DC
  Administered 2013-01-03 – 2013-01-07 (×9): 400 mg via ORAL
  Filled 2013-01-03 (×3): qty 2
  Filled 2013-01-03: qty 1
  Filled 2013-01-03: qty 2
  Filled 2013-01-03 (×4): qty 1
  Filled 2013-01-03: qty 2
  Filled 2013-01-03: qty 1

## 2013-01-03 MED ORDER — VALPROIC ACID 250 MG PO CAPS
1000.0000 mg | ORAL_CAPSULE | Freq: Once | ORAL | Status: AC
  Administered 2013-01-03: 1000 mg via ORAL
  Filled 2013-01-03: qty 4

## 2013-01-03 MED ORDER — ALBUTEROL SULFATE HFA 108 (90 BASE) MCG/ACT IN AERS
2.0000 | INHALATION_SPRAY | Freq: Four times a day (QID) | RESPIRATORY_TRACT | Status: DC | PRN
  Filled 2013-01-03: qty 6.7

## 2013-01-03 MED ORDER — IBUPROFEN 600 MG PO TABS
600.0000 mg | ORAL_TABLET | Freq: Three times a day (TID) | ORAL | Status: DC | PRN
  Filled 2013-01-03: qty 1

## 2013-01-03 MED ORDER — BUSPIRONE HCL 10 MG PO TABS
10.0000 mg | ORAL_TABLET | Freq: Every day | ORAL | Status: DC
  Administered 2013-01-04 – 2013-01-07 (×4): 10 mg via ORAL
  Filled 2013-01-03 (×5): qty 1

## 2013-01-03 MED ORDER — CHLORPROMAZINE HCL 100 MG PO TABS
100.0000 mg | ORAL_TABLET | Freq: Two times a day (BID) | ORAL | Status: DC
  Administered 2013-01-03 – 2013-01-07 (×9): 100 mg via ORAL
  Filled 2013-01-03: qty 1
  Filled 2013-01-03: qty 4
  Filled 2013-01-03: qty 1
  Filled 2013-01-03: qty 4
  Filled 2013-01-03: qty 1
  Filled 2013-01-03: qty 4
  Filled 2013-01-03: qty 1
  Filled 2013-01-03: qty 4
  Filled 2013-01-03 (×2): qty 1
  Filled 2013-01-03: qty 4

## 2013-01-03 MED ORDER — ALUM & MAG HYDROXIDE-SIMETH 200-200-20 MG/5ML PO SUSP
30.0000 mL | ORAL | Status: DC | PRN
  Filled 2013-01-03: qty 30

## 2013-01-03 MED ORDER — INSULIN ASPART 100 UNIT/ML ~~LOC~~ SOLN
2.0000 [IU] | Freq: Three times a day (TID) | SUBCUTANEOUS | Status: DC
  Administered 2013-01-03 – 2013-01-05 (×2): 2 [IU] via SUBCUTANEOUS
  Filled 2013-01-03 (×3): qty 1

## 2013-01-03 MED ORDER — CHLORDIAZEPOXIDE HCL 25 MG PO CAPS: 50.0000 mg | ORAL_CAPSULE | Freq: Three times a day (TID) | ORAL | Status: DC

## 2013-01-03 MED ORDER — AMANTADINE HCL 100 MG PO CAPS
100.0000 mg | ORAL_CAPSULE | Freq: Two times a day (BID) | ORAL | Status: DC
  Administered 2013-01-03 – 2013-01-07 (×8): 100 mg via ORAL
  Filled 2013-01-03 (×12): qty 1

## 2013-01-03 MED ORDER — CHLORPROMAZINE HCL 10 MG PO TABS
10.0000 mg | ORAL_TABLET | Freq: Once | ORAL | Status: DC
  Filled 2013-01-03: qty 1

## 2013-01-03 MED ORDER — CITALOPRAM HYDROBROMIDE 20 MG PO TABS
20.0000 mg | ORAL_TABLET | Freq: Every day | ORAL | Status: DC
  Administered 2013-01-03 – 2013-01-07 (×5): 20 mg via ORAL
  Filled 2013-01-03 (×3): qty 2
  Filled 2013-01-03 (×2): qty 1

## 2013-01-03 MED ORDER — ONDANSETRON HCL 4 MG PO TABS
4.0000 mg | ORAL_TABLET | Freq: Three times a day (TID) | ORAL | Status: DC | PRN
  Filled 2013-01-03: qty 1

## 2013-01-03 MED ORDER — ZOLPIDEM TARTRATE 5 MG PO TABS: 5.0000 mg | ORAL_TABLET | Freq: Every evening | ORAL | Status: DC | PRN

## 2013-01-03 NOTE — ED Notes (Signed)
CALLED PT PHARMACY [phone]) FOR CURRENT MED LIST. THEY WILL FAX IT OVER.
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
## 2013-01-03 NOTE — ED Notes (Signed)
Notified [facility] of need for sitter for SI, told charge, change into blue scrubs, and security to wand in room E46
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
## 2013-01-03 NOTE — ED Notes (Signed)
Pt's medications sent down to main pharmacy.  Form placed in pt's chart.

## 2013-01-03 NOTE — ED Notes (Signed)
Sitter at bedside.

## 2013-01-03 NOTE — ED Notes (Signed)
PT STATES SHE CANT GET OUT OF BED Because she is dizzy. Robin the pa has just left pt room and states there was no complaint of dizziness and pt is able to get out of bed

## 2013-01-03 NOTE — ED Notes (Signed)
Pt able to contract for safety.

## 2013-01-03 NOTE — ED Notes (Addendum)
PATIENT MOVED TO POD C. PT HEART RATE HAS NOT BEEN REASSESSED SINCE ARRIVAL. WILL RECHECK VITALS.REPORT WAS NOT CALLED TO NURSES BEFORE TRANSFER TO POD C.

## 2013-01-03 NOTE — ED Notes (Signed)
PT is here with suicidal thoughts and states that she is unstable.  Pt states that she wanted to take pills to hurt self but denies taking pills.  Pt has PTSD, bipolar, anxiety, schizo.

## 2013-01-04 ENCOUNTER — Emergency Department (HOSPITAL_COMMUNITY): Payer: Medicaid Other

## 2013-01-04 LAB — URINALYSIS, ROUTINE W REFLEX MICROSCOPIC
Bilirubin Urine: NEGATIVE
Glucose, UA: NEGATIVE mg/dL
Hgb urine dipstick: NEGATIVE
Ketones, ur: 15 mg/dL — AB
Nitrite: NEGATIVE
Protein, ur: NEGATIVE mg/dL
Specific Gravity, Urine: 1.03 (ref 1.005–1.030)
Urobilinogen, UA: 0.2 mg/dL (ref 0.0–1.0)
pH: 5.5 (ref 5.0–8.0)

## 2013-01-04 LAB — COMPREHENSIVE METABOLIC PANEL
BUN: 20 mg/dL (ref 6–23)
Calcium: 9.4 mg/dL (ref 8.4–10.5)
GFR calc Af Amer: 73 mL/min — ABNORMAL LOW (ref >90)
GFR calc non Af Amer: 63 mL/min — ABNORMAL LOW (ref >90)
Glucose, Bld: 125 mg/dL — ABNORMAL HIGH (ref 70–99)
Total Protein: 7.7 g/dL (ref 6.0–8.3)

## 2013-01-04 LAB — CBC
HCT: 36.9 % (ref 36.0–46.0)
Hemoglobin: 13.4 g/dL (ref 12.0–15.0)
MCH: 25.3 pg — ABNORMAL LOW (ref 26.0–34.0)
MCHC: 36.3 g/dL — ABNORMAL HIGH (ref 30.0–36.0)
MCV: 69.6 fL — ABNORMAL LOW (ref 78.0–100.0)

## 2013-01-04 LAB — POCT PREGNANCY, URINE: Preg Test, Ur: NEGATIVE

## 2013-01-04 LAB — RAPID URINE DRUG SCREEN, HOSP PERFORMED
Amphetamines: NOT DETECTED
Benzodiazepines: POSITIVE — AB
Cocaine: POSITIVE — AB
Opiates: NOT DETECTED
Tetrahydrocannabinol: NOT DETECTED

## 2013-01-04 LAB — GLUCOSE, CAPILLARY
Glucose-Capillary: 110 mg/dL — ABNORMAL HIGH (ref 70–99)
Glucose-Capillary: 129 mg/dL — ABNORMAL HIGH (ref 70–99)

## 2013-01-04 LAB — URINE MICROSCOPIC-ADD ON

## 2013-01-04 MED ORDER — AZITHROMYCIN 250 MG PO TABS
1000.0000 mg | ORAL_TABLET | Freq: Once | ORAL | Status: AC
  Administered 2013-01-04: 1000 mg via ORAL
  Filled 2013-01-04: qty 4

## 2013-01-04 MED ORDER — DEXTROSE 5 % IV SOLN
1.0000 g | Freq: Once | INTRAVENOUS | Status: AC
  Administered 2013-01-04: 1 g via INTRAVENOUS
  Filled 2013-01-04: qty 10

## 2013-01-04 MED ORDER — ACETAMINOPHEN 325 MG PO TABS
650.0000 mg | ORAL_TABLET | ORAL | Status: DC | PRN
  Administered 2013-01-04: 650 mg via ORAL
  Filled 2013-01-04 (×2): qty 2

## 2013-01-04 MED ORDER — DIVALPROEX SODIUM 500 MG PO DR TAB
500.0000 mg | DELAYED_RELEASE_TABLET | Freq: Two times a day (BID) | ORAL | Status: DC
  Administered 2013-01-04 – 2013-01-07 (×7): 500 mg via ORAL
  Filled 2013-01-04: qty 1
  Filled 2013-01-04 (×2): qty 2
  Filled 2013-01-04: qty 1
  Filled 2013-01-04: qty 2
  Filled 2013-01-04 (×4): qty 1

## 2013-01-04 NOTE — ED Notes (Addendum)
Pt requested and was given [place] sandwich and sprite zero.  Pt alert oriented X4
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
## 2013-01-04 NOTE — ED Notes (Signed)
Pt given extra pillow per request

## 2013-01-04 NOTE — ED Notes (Signed)
Pt requested that sitter remain outside room as "sitter is aggrevating her and she is getting upset".  Risk analyst and professional about the interaction with patient

## 2013-01-04 NOTE — ED Provider Notes (Signed)
Notified by nursing that pt has a fever 102.2.  DDx is infection - SBI, UTI, pneumonia, etc; Serotonin syndrome, NMS less likely.  Plan for CBC, CMP, UA, UHCG, CXR. Will reassess.    PE: VSS HEENT: NCAT, poor dentition Neck: FAROM, NT, no meningismus Heart: rrr, no c/r/m Lungs: mild wheezes diffuse ABD: soft, NT, ND Ext: MAE, nt, no c/c/e, no rigidity, nttp Neuro: mae, a and o in nad, MS 5/5, DTR 2/4    Filed Vitals:   01/04/13 1959  BP:   Pulse:   Temp: 97.5 F (36.4 C)  Resp:    Results for orders placed during the hospital encounter of 01/03/13  ACETAMINOPHEN LEVEL      Result Value Range   Acetaminophen (Tylenol), Serum <15.0  10 - 30 ug/mL  CBC      Result Value Range   WBC 10.2  4.0 - 10.5 K/uL   RBC 5.11  3.87 - 5.11 MIL/uL   Hemoglobin 12.8  12.0 - 15.0 g/dL   HCT 09.8 (*) 11.9 - 14.7 %   MCV 69.3 (*) 78.0 - 100.0 fL   MCH 25.0 (*) 26.0 - 34.0 pg   MCHC 36.2 (*) 30.0 - 36.0 g/dL   RDW 82.9 (*) 56.2 - 13.0 %   Platelets 246  150 - 400 K/uL  COMPREHENSIVE METABOLIC PANEL      Result Value Range   Sodium 137  135 - 145 mEq/L   Potassium 3.5  3.5 - 5.1 mEq/L   Chloride 101  96 - 112 mEq/L   CO2 25  19 - 32 mEq/L   Glucose, Bld 87  70 - 99 mg/dL   BUN 17  6 - 23 mg/dL   Creatinine, Ser 8.65  0.50 - 1.10 mg/dL   Calcium 9.4  8.4 - 78.4 mg/dL   Total Protein 7.9  6.0 - 8.3 g/dL   Albumin 3.8  3.5 - 5.2 g/dL   AST 17  0 - 37 U/L   ALT 24  0 - 35 U/L   Alkaline Phosphatase 47  39 - 117 U/L   Total Bilirubin 0.4  0.3 - 1.2 mg/dL   GFR calc non Af Amer 60 (*) >90 mL/min   GFR calc Af Amer 69 (*) >90 mL/min  ETHANOL      Result Value Range   Alcohol, Ethyl (B) <11  0 - 11 mg/dL  SALICYLATE LEVEL      Result Value Range   Salicylate Lvl <2.0 (*) 2.8 - 20.0 mg/dL  GLUCOSE, CAPILLARY      Result Value Range   Glucose-Capillary 123 (*) 70 - 99 mg/dL  GLUCOSE, CAPILLARY      Result Value Range   Glucose-Capillary 204 (*) 70 - 99 mg/dL  GLUCOSE, CAPILLARY       Result Value Range   Glucose-Capillary 110 (*) 70 - 99 mg/dL  GLUCOSE, CAPILLARY      Result Value Range   Glucose-Capillary 129 (*) 70 - 99 mg/dL  GLUCOSE, CAPILLARY      Result Value Range   Glucose-Capillary 129 (*) 70 - 99 mg/dL  CBC      Result Value Range   WBC 10.7 (*) 4.0 - 10.5 K/uL   RBC 5.30 (*) 3.87 - 5.11 MIL/uL   Hemoglobin 13.4  12.0 - 15.0 g/dL   HCT 69.6  29.5 - 28.4 %   MCV 69.6 (*) 78.0 - 100.0 fL   MCH 25.3 (*) 26.0 - 34.0 pg  MCHC 36.3 (*) 30.0 - 36.0 g/dL   RDW 16.1 (*) 09.6 - 04.5 %   Platelets 252  150 - 400 K/uL  COMPREHENSIVE METABOLIC PANEL      Result Value Range   Sodium 143  135 - 145 mEq/L   Potassium 4.2  3.5 - 5.1 mEq/L   Chloride 104  96 - 112 mEq/L   CO2 26  19 - 32 mEq/L   Glucose, Bld 125 (*) 70 - 99 mg/dL   BUN 20  6 - 23 mg/dL   Creatinine, Ser 4.09  0.50 - 1.10 mg/dL   Calcium 9.4  8.4 - 81.1 mg/dL   Total Protein 7.7  6.0 - 8.3 g/dL   Albumin 3.7  3.5 - 5.2 g/dL   AST 18  0 - 37 U/L   ALT 22  0 - 35 U/L   Alkaline Phosphatase 52  39 - 117 U/L   Total Bilirubin 0.1 (*) 0.3 - 1.2 mg/dL   GFR calc non Af Amer 63 (*) >90 mL/min   GFR calc Af Amer 73 (*) >90 mL/min  URINALYSIS, ROUTINE W REFLEX MICROSCOPIC      Result Value Range   Color, Urine YELLOW  YELLOW   APPearance CLEAR  CLEAR   Specific Gravity, Urine 1.030  1.005 - 1.030   pH 5.5  5.0 - 8.0   Glucose, UA NEGATIVE  NEGATIVE mg/dL   Hgb urine dipstick NEGATIVE  NEGATIVE   Bilirubin Urine NEGATIVE  NEGATIVE   Ketones, ur 15 (*) NEGATIVE mg/dL   Protein, ur NEGATIVE  NEGATIVE mg/dL   Urobilinogen, UA 0.2  0.0 - 1.0 mg/dL   Nitrite NEGATIVE  NEGATIVE   Leukocytes, UA SMALL (*) NEGATIVE  URINE MICROSCOPIC-ADD ON      Result Value Range   Squamous Epithelial / LPF RARE  RARE   WBC, UA 11-20  <3 WBC/hpf   RBC / HPF 0-2  <3 RBC/hpf   Bacteria, UA RARE  RARE   Urine-Other MUCOUS PRESENT    POCT PREGNANCY, URINE      Result Value Range   Preg Test, Ur NEGATIVE   NEGATIVE   DG CHEST 2 VIEW   Final Result:       CXR: read as atelectasis, but I suspect L lower lung field pneumonia  Will treat as CAP azithromycin and rocephin.  Pt has no allergies.    12:32 AM Filed Vitals:   01/05/13 0005  BP: 95/48  Pulse: 109  Temp: 98.2 F (36.8 C)  Resp: 20   Pt to receive 1L NS bolus and repeat vital signs. Pt t/o to Dr. Wilkie Aye at [phone].  If VS or condition changes pt may need to be reassessed.    Darlys Gales, MD 01/05/13 [phone]

## 2013-01-04 NOTE — ED Notes (Signed)
Pt given graham crackers and milk.  

## 2013-01-05 LAB — GLUCOSE, CAPILLARY
Glucose-Capillary: 154 mg/dL — ABNORMAL HIGH (ref 70–99)
Glucose-Capillary: 172 mg/dL — ABNORMAL HIGH (ref 70–99)
Glucose-Capillary: 173 mg/dL — ABNORMAL HIGH (ref 70–99)
Glucose-Capillary: 247 mg/dL — ABNORMAL HIGH (ref 70–99)

## 2013-01-05 MED ORDER — INSULIN ASPART 100 UNIT/ML ~~LOC~~ SOLN
0.0000 [IU] | Freq: Three times a day (TID) | SUBCUTANEOUS | Status: DC
  Administered 2013-01-05: 3 [IU] via SUBCUTANEOUS
  Administered 2013-01-06: 5 [IU] via SUBCUTANEOUS
  Administered 2013-01-06: 8 [IU] via SUBCUTANEOUS
  Administered 2013-01-06: 5 [IU] via SUBCUTANEOUS

## 2013-01-05 MED ORDER — SODIUM CHLORIDE 0.9 % IV SOLN
Freq: Once | INTRAVENOUS | Status: AC
  Administered 2013-01-05: 1000 mL via INTRAVENOUS

## 2013-01-05 MED ORDER — SODIUM CHLORIDE 0.9 % IV SOLN
INTRAVENOUS | Status: AC
  Administered 2013-01-05: 22:00:00 via INTRAVENOUS

## 2013-01-05 MED ORDER — SODIUM CHLORIDE 0.9 % IV BOLUS (SEPSIS)
1000.0000 mL | Freq: Once | INTRAVENOUS | Status: AC
  Administered 2013-01-05: 1000 mL via INTRAVENOUS

## 2013-01-05 MED ORDER — SODIUM CHLORIDE 0.9 % IV SOLN
Freq: Once | INTRAVENOUS | Status: AC
  Administered 2013-01-05: 02:00:00 via INTRAVENOUS

## 2013-01-05 NOTE — Progress Notes (Signed)
PENDING ACCEPTANCE TRANFER NOTE:  Call received from:    [place]  REASON FOR REQUESTING TRANSFER:    Fever and hypotension  HPI:   47 year old to be in the emergency department for the past 55 hours, admitted initially for cocaine abuse, suicidal/homicidal. While she is in the emergency department she developed fever of 102.2, chest x-ray shows some atelectasis but no pneumonia, urinalysis shows 11-20 WBCs. [facility]   PLAN:  According to telephone report, this patient was accepted admission to [place],   Under [facility] team:  10,  I have requested an order be written to call Flow Manager at [phone] upon patient arrival to the floor for final physician assignment who will do the admission and give admitting orders.  SIGNED: Clint Lipps, MD [facility]  01/05/2013, 6:53 PM

## 2013-01-05 NOTE — ED Notes (Signed)
Pt hooked up to cardiac monitoring, BP to be cycled every 15 minutes per MD request.

## 2013-01-05 NOTE — ED Notes (Signed)
MD informed of pt's VS after fluid bolus completed. See new orders.

## 2013-01-05 NOTE — ED Notes (Signed)
CBG 128 

## 2013-01-05 NOTE — ED Notes (Signed)
Dr. Radford Pax advised of orthostatics.   He said we would try to get her admitted.

## 2013-01-05 NOTE — ED Notes (Signed)
Patient is resting comfortably. 

## 2013-01-05 NOTE — ED Notes (Signed)
In to ambulate pt to check her sats and tolerance after receiving a liter bolus of ns. Orthostatics checked and pt found to be orthostatic. Did not ambulate pt due to safety concern.

## 2013-01-05 NOTE — ED Provider Notes (Signed)
Medical screening examination/treatment/procedure(s) were performed by non-physician practitioner and as supervising physician I was immediately available for consultation/collaboration.  Flint Melter, MD 01/05/13 1401

## 2013-01-05 NOTE — ED Notes (Signed)
Patient requested snack, graham crackers and diet sprite given per nurse approval.  Sitter at bedside, will continue to monitor.

## 2013-01-05 NOTE — ED Notes (Signed)
PATIENT IS GOING TO BE EVALUATED FOR ADMISSION TO THE HOSPITAL.

## 2013-01-05 NOTE — Progress Notes (Signed)
Pt admitted from the ed for pneumonia, to 3w12, pt is on suicide precaution, sitter is at the bedside, will continue to monitor pt---Tommey Barret, rn

## 2013-01-05 NOTE — ED Notes (Signed)
Attempted report Rn asked to call her back in 

## 2013-01-05 NOTE — ED Notes (Signed)
Walked patient from bed down the hall.  Patient's sats dropped only slightly from 99 % to 97%, but patient's heartrate elevated to 127 while trying to ambulate.  Patient stated that after a short distance that she was too "winded and tired" to go further.   Patient was taken back to bed to rest.

## 2013-01-05 NOTE — ED Notes (Signed)
CBG - 173 

## 2013-01-05 NOTE — ED Notes (Signed)
Patient refused to walk at this time with EMT for O2 sat information/HR information during ambulation.   Patient "sleepy and dont feel like it".

## 2013-01-06 DIAGNOSIS — J209 Acute bronchitis, unspecified: Secondary | ICD-10-CM

## 2013-01-06 DIAGNOSIS — F319 Bipolar disorder, unspecified: Secondary | ICD-10-CM

## 2013-01-06 DIAGNOSIS — R45851 Suicidal ideations: Secondary | ICD-10-CM

## 2013-01-06 DIAGNOSIS — F1420 Cocaine dependence, uncomplicated: Secondary | ICD-10-CM

## 2013-01-06 DIAGNOSIS — F1410 Cocaine abuse, uncomplicated: Secondary | ICD-10-CM

## 2013-01-06 DIAGNOSIS — R45850 Homicidal ideations: Secondary | ICD-10-CM

## 2013-01-06 DIAGNOSIS — E1165 Type 2 diabetes mellitus with hyperglycemia: Secondary | ICD-10-CM | POA: Diagnosis present

## 2013-01-06 DIAGNOSIS — F4310 Post-traumatic stress disorder, unspecified: Secondary | ICD-10-CM

## 2013-01-06 LAB — GLUCOSE, CAPILLARY
Glucose-Capillary: 208 mg/dL — ABNORMAL HIGH (ref 70–99)
Glucose-Capillary: 200 mg/dL — ABNORMAL HIGH (ref 70–99)
Glucose-Capillary: 254 mg/dL — ABNORMAL HIGH (ref 70–99)
Glucose-Capillary: 398 mg/dL — ABNORMAL HIGH (ref 70–99)

## 2013-01-06 MED ORDER — HEPARIN SODIUM (PORCINE) 5000 UNIT/ML IJ SOLN
5000.0000 [IU] | Freq: Three times a day (TID) | INTRAMUSCULAR | Status: DC
  Administered 2013-01-06 – 2013-01-07 (×3): 5000 [IU] via SUBCUTANEOUS
  Filled 2013-01-06 (×6): qty 1

## 2013-01-06 MED ORDER — AMOXICILLIN-POT CLAVULANATE 875-125 MG PO TABS
1.0000 | ORAL_TABLET | Freq: Two times a day (BID) | ORAL | Status: DC
  Administered 2013-01-06 – 2013-01-07 (×3): 1 via ORAL
  Filled 2013-01-06 (×4): qty 1

## 2013-01-06 MED ORDER — HYDROCODONE-ACETAMINOPHEN 5-325 MG PO TABS: 1.0000 | ORAL_TABLET | ORAL | Status: DC | PRN

## 2013-01-06 MED ORDER — IPRATROPIUM BROMIDE 0.02 % IN SOLN
0.5000 mg | Freq: Four times a day (QID) | RESPIRATORY_TRACT | Status: DC
  Administered 2013-01-06 (×2): 0.5 mg via RESPIRATORY_TRACT
  Filled 2013-01-06 (×2): qty 2.5

## 2013-01-06 MED ORDER — GUAIFENESIN-DM 100-10 MG/5ML PO SYRP: 5.0000 mL | ORAL_SOLUTION | ORAL | Status: DC | PRN

## 2013-01-06 MED ORDER — INSULIN ASPART 100 UNIT/ML ~~LOC~~ SOLN
0.0000 [IU] | Freq: Every day | SUBCUTANEOUS | Status: DC
  Administered 2013-01-06: 5 [IU] via SUBCUTANEOUS

## 2013-01-06 MED ORDER — IPRATROPIUM BROMIDE 0.02 % IN SOLN: 0.5000 mg | RESPIRATORY_TRACT | Status: DC | PRN

## 2013-01-06 MED ORDER — GUAIFENESIN ER 600 MG PO TB12
1200.0000 mg | ORAL_TABLET | Freq: Two times a day (BID) | ORAL | Status: DC
  Administered 2013-01-06 – 2013-01-07 (×3): 1200 mg via ORAL
  Filled 2013-01-06 (×4): qty 2

## 2013-01-06 MED ORDER — SODIUM CHLORIDE 0.9 % IV SOLN: INTRAVENOUS | Status: DC

## 2013-01-06 MED ORDER — LEVOFLOXACIN 750 MG PO TABS: 750.0000 mg | ORAL_TABLET | Freq: Every day | ORAL | Status: DC

## 2013-01-06 MED ORDER — INSULIN ASPART 100 UNIT/ML ~~LOC~~ SOLN
0.0000 [IU] | Freq: Three times a day (TID) | SUBCUTANEOUS | Status: DC
  Administered 2013-01-07 (×2): 5 [IU] via SUBCUTANEOUS

## 2013-01-06 NOTE — Treatment Plan (Signed)
Pt has been accepted to [facility] room 303-1 by Dr. Elsie Saas once deemed medically clear.

## 2013-01-06 NOTE — Progress Notes (Signed)
Inpatient Diabetes Program Recommendations  AACE/ADA: New Consensus Statement on Inpatient Glycemic Control (2013)  Target Ranges:  Prepandial:   less than 140 mg/dL      Peak postprandial:   less than 180 mg/dL (1-2 hours)      Critically ill patients:  140 - 180 mg/dL  Results for Kathleen [name], Kathleen [name] (MRN [id number]) as of 01/06/2013 15:15  Ref. Range 01/05/2013 11:30 01/05/2013 17:23 01/05/2013 21:07 01/06/2013 08:10 01/06/2013 11:59  Glucose-Capillary Latest Range: 70-99 mg/dL 401 (H) 027 (H) 253 (H) 208 (H) 200 (H)   Inpatient Diabetes Program Recommendations Insulin - Basal: consider adding Lantus 10 units  Thank you  Piedad Climes BSN, RN,CDE Inpatient Diabetes Coordinator [phone] (team pager)

## 2013-01-06 NOTE — Progress Notes (Signed)
Utilization review completed.  

## 2013-01-06 NOTE — Clinical Social Work Psych Note (Signed)
Psych CSW received notification from unit CSW that pt possibly had been referred to [facility].  Psych CSW contacted [facility].  [facility] reports that pt has not been accepted.  [facility] is awaiting pt's medical clearance prior to reviewing the pt.    Per [facility], pt had tele-evaluation on 9/1.  Pt will need a re-evaluation prior to being d/c if MD feels appropriate.  [facility] aware.  Psych CSW will continue to follow.  Vickii Penna, LCSWA [phone]  Clinical Social Work

## 2013-01-06 NOTE — H&P (Signed)
[facility] History and Physical  Kathleen [name] WUJ:[id number] DOB: 12/24/1965 DOA: 01/03/2013  Referring physician: Rubin Payor PCP: No PCP Per Patient  Specialists:   Chief Complaint: Fever  HPI: Kathleen [name] is a 47 y.o. female with past medical history of PTSD, and that and diabetes mellitus and seizure. Came in to the hospital 2 days ago because of cocaine abuse and suicidal/homicidal ideation. Patient was seen by ACT team and recommended admission to behavior [facility], while she was waiting for the bed she developed fever of 102.2. Patient mentioned some cough and sputum production, denies any dysuria. Chest x-ray showed some atelectasis but no pneumonia, urinalysis showed 11-20 WBCs. Patient admitted to the hospital because of fever and patient also developed hypotension with blood pressure of 85/69.  Review of Systems:  Constitutional: negative for anorexia, fevers and sweats Eyes: negative for irritation, redness and visual disturbance Ears, nose, mouth, throat, and face: negative for earaches, epistaxis, nasal congestion and sore throat Respiratory: Positive for cough and sputum. Negative for wheezing Cardiovascular: negative for chest pain, dyspnea, lower extremity edema, orthopnea, palpitations and syncope Gastrointestinal: negative for abdominal pain, constipation, diarrhea, melena, nausea and vomiting Genitourinary:negative for dysuria, frequency and hematuria Hematologic/lymphatic: negative for bleeding, easy bruising and lymphadenopathy Musculoskeletal:negative for arthralgias, muscle weakness and stiff joints Neurological: negative for coordination problems, gait problems, headaches and weakness Endocrine: negative for diabetic symptoms including polydipsia, polyuria and weight loss Allergic/Immunologic: negative for anaphylaxis, hay fever and urticaria   Past Medical History  Diagnosis Date  . Anxiety   . Post traumatic stress disorder   .  Schizoaffective disorder, bipolar type   . Asthma   . Headache(784.0)   . Cocaine-induced mental disorder     psychosis  . 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 Seizures 01/03/2013    Most recent 12/31/2012, per pt report.  Marland Kitchen COPD (chronic obstructive pulmonary disease)    History reviewed. No pertinent past surgical history. Social History:  reports that she has been smoking Cigarettes.  She has a 30 pack-year smoking history. She has never used smokeless tobacco. She reports that she uses illicit drugs ("Crack" cocaine and Cocaine). She reports that she does not drink alcohol.  Allergies  Allergen Reactions  . Peach Flavor Rash    Family History  Problem Relation Age of Onset  . Diabetes Mother   . Cancer Mother    Prior to Admission medications   Medication Sig Start Date End Date Taking? Authorizing Provider  albuterol (PROVENTIL HFA;VENTOLIN HFA) 108 (90 BASE) MCG/ACT inhaler Inhale 2 puffs into the lungs every 6 (six) hours as needed. shortness of breath   Yes Historical Provider, MD  ALPRAZolam (XANAX) 0.5 MG tablet Take 0.5 mg by mouth 3 (three) times daily.   Yes Historical Provider, MD  chlorproMAZINE (THORAZINE) 100 MG tablet Take 100 mg by mouth 2 (two) times daily.   Yes Historical Provider, MD  insulin aspart (NOVOLOG) 100 UNIT/ML injection Inject 2 Units into the skin 3 (three) times daily before meals. Sliding scale   Yes Historical Provider, MD  QUEtiapine (SEROQUEL) 400 MG tablet Take 400 mg by mouth 2 (two) times daily.   Yes Historical Provider, MD   Physical Exam: Filed Vitals:   01/06/13 0534  BP: 107/63  Pulse: 117  Temp: 97.4 F (36.3 C)  Resp: 18   General appearance: alert, cooperative and no distress  Head: Normocephalic, without obvious abnormality, atraumatic  Eyes: conjunctivae/corneas clear. PERRL, EOM's intact. Fundi benign.  Nose: Nares normal. Septum midline. Mucosa normal. No drainage or sinus tenderness.  Throat: lips, mucosa,  and tongue normal; teeth and gums normal  Neck: Supple, no masses, no cervical lymphadenopathy, no JVD appreciated, no meningeal signs Resp: clear to auscultation bilaterally  Chest wall: no tenderness  Cardio: regular rate and rhythm, S1, S2 normal, no murmur, click, rub or gallop  GI: soft, non-tender; bowel sounds normal; no masses, no organomegaly  Extremities: extremities normal, atraumatic, no cyanosis or edema  Skin: Skin color, texture, turgor normal. No rashes or lesions  Neurologic: Alert and oriented X 3, normal strength and tone. Normal symmetric reflexes. Normal coordination and gait   Labs on Admission:  Basic Metabolic Panel:  Recent Labs Lab 01/03/13 1157 01/04/13 1912  NA 137 143  K 3.5 4.2  CL 101 104  CO2 25 26  GLUCOSE 87 125*  BUN 17 20  CREATININE 1.09 1.04  CALCIUM 9.4 9.4   Liver Function Tests:  Recent Labs Lab 01/03/13 1157 01/04/13 1912  AST 17 18  ALT 24 22  ALKPHOS 47 52  BILITOT 0.4 0.1*  PROT 7.9 7.7  ALBUMIN 3.8 3.7   No results found for this basename: LIPASE, AMYLASE,  in the last 168 hours No results found for this basename: AMMONIA,  in the last 168 hours CBC:  Recent Labs Lab 01/03/13 1157 01/04/13 1912  WBC 10.2 10.7*  HGB 12.8 13.4  HCT 35.4* 36.9  MCV 69.3* 69.6*  PLT 246 252   Cardiac Enzymes: No results found for this basename: CKTOTAL, CKMB, CKMBINDEX, TROPONINI,  in the last 168 hours  BNP (last 3 results) No results found for this basename: PROBNP,  in the last 8760 hours CBG:  Recent Labs Lab 01/05/13 0742 01/05/13 1130 01/05/13 1723 01/05/13 2107 01/06/13 0810  GLUCAP 128* 154* 172* 247* 208*    Radiological Exams on Admission: Dg Chest 2 View  01/04/2013   *RADIOLOGY REPORT*  Clinical Data: Fever  CHEST - 2 VIEW  Comparison: 07/03/2011  Findings: Hypoventilation with bibasilar atelectasis.  Negative for heart failure or effusion.  Heart size is normal.  IMPRESSION: Moderate bibasilar atelectasis.    Original Report Authenticated By: Janeece Riggers, M.D.    EKG: Independently reviewed.   Assessment/Plan Principal Problem:   Acute bronchitis Active Problems:   Cocaine abuse   Suicidal ideation   Homicidal ideation   Acute bronchitis -Fever of 102.2, cough and sputum production as well as some shortness of breath. -No wheezing, gout there is component of COPD exacerbation. -Chest x-ray showed atelectasis, but no opacities to support the presence of pneumonia. -Urinalysis showed 11-2 pus cells, but she denies any UTI symptoms. -Patient started on Levaquin, deferred due and steroids secondary to existence of wheezing. -Supportive management with bronchodilators, antitussives, mucolytics and oxygen as needed.  Diabetes mellitus type 2 -Carbohydrate modified diet, insulin sliding scale. -Check hemoglobin A1c.  Cocaine abuse -Patient is going to the inpatient side.  Suicidal/homicidal ideation -As mentioned above, she will go to the behavioral health hospital which is medically stable. -If she defervesces overnight, she'll be appropriate for discharge to the care of her house hospital in the morning.  Code Status: Full code Family Communication: Plan discussed with the patient Disposition Plan: Inpatient  Time spent: 70 minutes  [facility] Pager [phone]  If 7PM-7AM, please contact night-coverage [email or web address] Password [facility] 01/06/2013, 9:37 AM

## 2013-01-06 NOTE — Progress Notes (Signed)
Resting in bed with eyes closed, resp. even and regular, safety sitter at bedside, bed alarm on, call light in reach.

## 2013-01-06 NOTE — Consult Note (Signed)
Reason for Consult: Depression, anxiety, substance abuse and suicidal ideation Referring Physician: Clydia Llano, MD  Kathleen [name] is an 47 y.o. female.  HPI:  Patient is seen and chart reviewed. Patient is known to this provider from [facility] Patient reported she has been relapsed in drug of abuse which is cocaine. Patient reported she started auditory hallucinations which are command in nature telling to fight with her roommate and also with her aunt. Patient complained of feeling depressed, irritable, angry, anxious and suicidal ideations. She has been tired of feeling depressed and abusing cocoain. She has 30 years old grand baby and daughter in [place]. Patient daughter 50 years old and Nurse and told patient to stay clean for one year so that they can move close to her. Patient stated that is her motivation to get better. Patient is willing to volunteer psychiatric hospitalization.  Medical history: Kathleen [name] is a 47 y.o. female came in to the hospital 2 days ago because of cocaine abuse and suicidal/homicidal ideation. Patient was seen by ACT team and recommended [facility], while she was waiting for the bed she developed fever of 102.2. Patient mentioned some cough and sputum production, denies any dysuria. Chest x-ray showed some atelectasis but no pneumonia, urinalysis showed 11-20 WBCs. Patient admitted to the hospital because of fever and patient also developed hypotension with blood pressure of 85/69.    Mental Status Examination: Patient appeared as per his stated age, and poorly groomed, and maintaining good eye contact. Patient has depressed and irritable  mood and his affect was labile . He has normal rate, rhythm, and increased volume of speech. His thought process is linear and goal directed. Patient has endorsed suicidal ideation with plan of overdosing on medication, homicidal  ideations to roommate or anybody mean to her. Patient has no evidence of auditory or visual hallucinations, delusions, and paranoia. Patient has poor insight judgment and impulse control.  Past Medical History  Diagnosis Date  . Anxiety   . Post traumatic stress disorder   . Schizoaffective disorder, bipolar type   . Asthma   . Headache(784.0)   . Cocaine-induced mental disorder     psychosis  . 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 Seizures 01/03/2013    Most recent 12/31/2012, per pt report.  Marland Kitchen COPD (chronic obstructive pulmonary disease)     History reviewed. No pertinent past surgical history.  Family History  Problem Relation Age of Onset  . Diabetes Mother   . Cancer Mother     Social History:  reports that she has been smoking Cigarettes.  She has a 30 pack-year smoking history. She has never used smokeless tobacco. She reports that she uses illicit drugs ("Crack" cocaine and Cocaine). She reports that she does not drink alcohol.  Allergies:  Allergies  Allergen Reactions  . Peach Flavor Rash    Medications: I have reviewed the patient's current medications.  Results for orders placed during the hospital encounter of 01/03/13 (from the past 48 hour(s))  GLUCOSE, CAPILLARY     Status: Abnormal   Collection Time    01/04/13  5:35 PM      Result Value Range   Glucose-Capillary 129 (*) 70 - 99 mg/dL  CBC     Status: Abnormal   Collection Time    01/04/13  7:12 PM      Result Value Range   WBC 10.7 (*) 4.0 - 10.5  K/uL   RBC 5.30 (*) 3.87 - 5.11 MIL/uL   Hemoglobin 13.4  12.0 - 15.0 g/dL   HCT 10.2  72.5 - 36.6 %   MCV 69.6 (*) 78.0 - 100.0 fL   MCH 25.3 (*) 26.0 - 34.0 pg   MCHC 36.3 (*) 30.0 - 36.0 g/dL   RDW 44.0 (*) 34.7 - 42.5 %   Platelets 252  150 - 400 K/uL  COMPREHENSIVE METABOLIC PANEL     Status: Abnormal   Collection Time    01/04/13  7:12 PM      Result Value Range   Sodium 143  135 - 145 mEq/L   Potassium 4.2  3.5 - 5.1 mEq/L    Chloride 104  96 - 112 mEq/L   CO2 26  19 - 32 mEq/L   Glucose, Bld 125 (*) 70 - 99 mg/dL   BUN 20  6 - 23 mg/dL   Creatinine, Ser 9.56  0.50 - 1.10 mg/dL   Calcium 9.4  8.4 - 38.7 mg/dL   Total Protein 7.7  6.0 - 8.3 g/dL   Albumin 3.7  3.5 - 5.2 g/dL   AST 18  0 - 37 U/L   ALT 22  0 - 35 U/L   Alkaline Phosphatase 52  39 - 117 U/L   Total Bilirubin 0.1 (*) 0.3 - 1.2 mg/dL   GFR calc non Af Amer 63 (*) >90 mL/min   GFR calc Af Amer 73 (*) >90 mL/min   Comment: (NOTE)     The eGFR has been calculated using the CKD EPI equation.     This calculation has not been validated in all clinical situations.     eGFR's persistently <90 mL/min signify possible Chronic Kidney     Disease.  URINE RAPID DRUG SCREEN (HOSP PERFORMED)     Status: Abnormal   Collection Time    01/04/13  8:44 PM      Result Value Range   Opiates NONE DETECTED  NONE DETECTED   Cocaine POSITIVE (*) NONE DETECTED   Benzodiazepines POSITIVE (*) NONE DETECTED   Amphetamines NONE DETECTED  NONE DETECTED   Tetrahydrocannabinol NONE DETECTED  NONE DETECTED   Barbiturates NONE DETECTED  NONE DETECTED   Comment:            DRUG SCREEN FOR MEDICAL PURPOSES     ONLY.  IF CONFIRMATION IS NEEDED     FOR ANY PURPOSE, NOTIFY LAB     WITHIN 5 DAYS.                LOWEST DETECTABLE LIMITS     FOR URINE DRUG SCREEN     Drug Class       Cutoff (ng/mL)     Amphetamine      1000     Barbiturate      200     Benzodiazepine   200     Tricyclics       300     Opiates          300     Cocaine          300     THC              50  URINALYSIS, ROUTINE W REFLEX MICROSCOPIC     Status: Abnormal   Collection Time    01/04/13  8:44 PM      Result Value Range   Color, Urine YELLOW  YELLOW   APPearance CLEAR  CLEAR   Specific Gravity, Urine 1.030  1.005 - 1.030   pH 5.5  5.0 - 8.0   Glucose, UA NEGATIVE  NEGATIVE mg/dL   Hgb urine dipstick NEGATIVE  NEGATIVE   Bilirubin Urine NEGATIVE  NEGATIVE   Ketones, ur 15 (*) NEGATIVE mg/dL    Protein, ur NEGATIVE  NEGATIVE mg/dL   Urobilinogen, UA 0.2  0.0 - 1.0 mg/dL   Nitrite NEGATIVE  NEGATIVE   Leukocytes, UA SMALL (*) NEGATIVE  URINE MICROSCOPIC-ADD ON     Status: None   Collection Time    01/04/13  8:44 PM      Result Value Range   Squamous Epithelial / LPF RARE  RARE   WBC, UA 11-20  <3 WBC/hpf   RBC / HPF 0-2  <3 RBC/hpf   Bacteria, UA RARE  RARE   Urine-Other MUCOUS PRESENT    POCT PREGNANCY, URINE     Status: None   Collection Time    01/04/13  8:51 PM      Result Value Range   Preg Test, Ur NEGATIVE  NEGATIVE   Comment:            THE SENSITIVITY OF THIS     METHODOLOGY IS >24 mIU/mL  GLUCOSE, CAPILLARY     Status: Abnormal   Collection Time    01/05/13 12:20 AM      Result Value Range   Glucose-Capillary 173 (*) 70 - 99 mg/dL  GLUCOSE, CAPILLARY     Status: Abnormal   Collection Time    01/05/13  7:42 AM      Result Value Range   Glucose-Capillary 128 (*) 70 - 99 mg/dL  GLUCOSE, CAPILLARY     Status: Abnormal   Collection Time    01/05/13 11:30 AM      Result Value Range   Glucose-Capillary 154 (*) 70 - 99 mg/dL  GLUCOSE, CAPILLARY     Status: Abnormal   Collection Time    01/05/13  5:23 PM      Result Value Range   Glucose-Capillary 172 (*) 70 - 99 mg/dL  GLUCOSE, CAPILLARY     Status: Abnormal   Collection Time    01/05/13  9:07 PM      Result Value Range   Glucose-Capillary 247 (*) 70 - 99 mg/dL  GLUCOSE, CAPILLARY     Status: Abnormal   Collection Time    01/06/13  8:10 AM      Result Value Range   Glucose-Capillary 208 (*) 70 - 99 mg/dL   Comment 1 Documented in Chart     Comment 2 Notify RN    GLUCOSE, CAPILLARY     Status: Abnormal   Collection Time    01/06/13 11:59 AM      Result Value Range   Glucose-Capillary 200 (*) 70 - 99 mg/dL   Comment 1 Documented in Chart     Comment 2 Notify RN      Dg Chest 2 View  01/04/2013   *RADIOLOGY REPORT*  Clinical Data: Fever  CHEST - 2 VIEW  Comparison: 07/03/2011  Findings:  Hypoventilation with bibasilar atelectasis.  Negative for heart failure or effusion.  Heart size is normal.  IMPRESSION: Moderate bibasilar atelectasis.   Original Report Authenticated By: Janeece Riggers, M.D.    Positive for aggressive behavior, bad mood, behavior problems, bipolar, depression, illegal drug usage, mood swings and sleep disturbance Blood pressure 113/65, pulse 95, temperature 98.5 F (36.9 C), temperature source Oral, resp. rate 18, last  menstrual period 12/27/2012, SpO2 95.00%.   Assessment/Plan: Bipolar disorder NOS Post traumatic stress disorder Cocaine abuse vs dependence  Recommendation: Patient meets criteria for acute psych hospitalization and pending bed available Continue current medication without changes Appreciate psych consult and will sign off today   Nehemiah Settle., MD 01/06/2013, 4:35 PM

## 2013-01-07 ENCOUNTER — Encounter (HOSPITAL_COMMUNITY): Payer: Self-pay | Admitting: *Deleted

## 2013-01-07 ENCOUNTER — Inpatient Hospital Stay (HOSPITAL_COMMUNITY)
Admission: AD | Admit: 2013-01-07 | Discharge: 2013-01-12 | DRG: 885 | Disposition: A | Discharge status: Home / Self Care | Payer: MEDICAID | Source: Intra-hospital | Attending: Psychiatry | Admitting: Psychiatry

## 2013-01-07 DIAGNOSIS — E119 Type 2 diabetes mellitus without complications: Secondary | ICD-10-CM | POA: Diagnosis present

## 2013-01-07 DIAGNOSIS — Z79899 Other long term (current) drug therapy: Secondary | ICD-10-CM

## 2013-01-07 DIAGNOSIS — F1420 Cocaine dependence, uncomplicated: Secondary | ICD-10-CM | POA: Diagnosis present

## 2013-01-07 DIAGNOSIS — F39 Unspecified mood [affective] disorder: Secondary | ICD-10-CM | POA: Diagnosis present

## 2013-01-07 DIAGNOSIS — E1165 Type 2 diabetes mellitus with hyperglycemia: Secondary | ICD-10-CM

## 2013-01-07 DIAGNOSIS — R45851 Suicidal ideations: Secondary | ICD-10-CM

## 2013-01-07 DIAGNOSIS — F259 Schizoaffective disorder, unspecified: Principal | ICD-10-CM | POA: Diagnosis present

## 2013-01-07 DIAGNOSIS — F4310 Post-traumatic stress disorder, unspecified: Secondary | ICD-10-CM | POA: Diagnosis present

## 2013-01-07 DIAGNOSIS — F3111 Bipolar disorder, current episode manic without psychotic features, mild: Secondary | ICD-10-CM

## 2013-01-07 DIAGNOSIS — J45909 Unspecified asthma, uncomplicated: Secondary | ICD-10-CM | POA: Diagnosis present

## 2013-01-07 DIAGNOSIS — F14159 Cocaine abuse with cocaine-induced psychotic disorder, unspecified: Secondary | ICD-10-CM

## 2013-01-07 DIAGNOSIS — J209 Acute bronchitis, unspecified: Secondary | ICD-10-CM

## 2013-01-07 DIAGNOSIS — R45850 Homicidal ideations: Secondary | ICD-10-CM

## 2013-01-07 DIAGNOSIS — F1410 Cocaine abuse, uncomplicated: Secondary | ICD-10-CM

## 2013-01-07 DIAGNOSIS — F411 Generalized anxiety disorder: Secondary | ICD-10-CM | POA: Diagnosis present

## 2013-01-07 DIAGNOSIS — Z590 Homelessness: Secondary | ICD-10-CM

## 2013-01-07 LAB — GLUCOSE, CAPILLARY
Glucose-Capillary: 347 mg/dL — ABNORMAL HIGH (ref 70–99)
Glucose-Capillary: 221 mg/dL — ABNORMAL HIGH (ref 70–99)
Glucose-Capillary: 226 mg/dL — ABNORMAL HIGH (ref 70–99)
Glucose-Capillary: 74 mg/dL (ref 70–99)

## 2013-01-07 LAB — CBC
Platelets: 223 10*3/uL (ref 150–400)
RBC: 4.47 MIL/uL (ref 3.87–5.11)
WBC: 10.8 10*3/uL — ABNORMAL HIGH (ref 4.0–10.5)

## 2013-01-07 LAB — BASIC METABOLIC PANEL
Calcium: 9.3 mg/dL (ref 8.4–10.5)
GFR calc non Af Amer: >90 mL/min (ref >90)
Sodium: 134 mEq/L — ABNORMAL LOW (ref 135–145)

## 2013-01-07 MED ORDER — ALBUTEROL SULFATE HFA 108 (90 BASE) MCG/ACT IN AERS
2.0000 | INHALATION_SPRAY | RESPIRATORY_TRACT | Status: DC | PRN
  Administered 2013-01-07: 2 via RESPIRATORY_TRACT
  Filled 2013-01-07: qty 6.7

## 2013-01-07 MED ORDER — QUETIAPINE FUMARATE 400 MG PO TABS
400.0000 mg | ORAL_TABLET | Freq: Two times a day (BID) | ORAL | Status: DC
  Administered 2013-01-07 – 2013-01-12 (×9): 400 mg via ORAL
  Filled 2013-01-07 (×15): qty 1

## 2013-01-07 MED ORDER — CHLORDIAZEPOXIDE HCL 25 MG PO CAPS
25.0000 mg | ORAL_CAPSULE | Freq: Four times a day (QID) | ORAL | Status: AC | PRN
  Administered 2013-01-09 – 2013-01-10 (×3): 25 mg via ORAL
  Filled 2013-01-07 (×4): qty 1

## 2013-01-07 MED ORDER — GUAIFENESIN ER 600 MG PO TB12: 1200.0000 mg | ORAL_TABLET | Freq: Two times a day (BID) | ORAL | Status: DC

## 2013-01-07 MED ORDER — CHLORDIAZEPOXIDE HCL 25 MG PO CAPS
25.0000 mg | ORAL_CAPSULE | Freq: Once | ORAL | Status: DC
  Filled 2013-01-07: qty 1

## 2013-01-07 MED ORDER — CHLORDIAZEPOXIDE HCL 25 MG PO CAPS
25.0000 mg | ORAL_CAPSULE | Freq: Three times a day (TID) | ORAL | Status: AC
  Administered 2013-01-08 – 2013-01-09 (×3): 25 mg via ORAL
  Filled 2013-01-07 (×3): qty 1

## 2013-01-07 MED ORDER — INSULIN ASPART 100 UNIT/ML ~~LOC~~ SOLN
2.0000 [IU] | Freq: Three times a day (TID) | SUBCUTANEOUS | Status: DC
  Administered 2013-01-08 – 2013-01-12 (×13): 2 [IU] via SUBCUTANEOUS

## 2013-01-07 MED ORDER — CHLORDIAZEPOXIDE HCL 25 MG PO CAPS
25.0000 mg | ORAL_CAPSULE | ORAL | Status: AC
  Administered 2013-01-09 – 2013-01-10 (×2): 25 mg via ORAL
  Filled 2013-01-07 (×2): qty 1

## 2013-01-07 MED ORDER — MAGNESIUM HYDROXIDE 400 MG/5ML PO SUSP: 30.0000 mL | Freq: Every day | ORAL | Status: DC | PRN

## 2013-01-07 MED ORDER — TRAZODONE HCL 50 MG PO TABS
50.0000 mg | ORAL_TABLET | Freq: Every evening | ORAL | Status: DC | PRN
  Administered 2013-01-07: 50 mg via ORAL
  Filled 2013-01-07 (×3): qty 1

## 2013-01-07 MED ORDER — CHLORDIAZEPOXIDE HCL 25 MG PO CAPS: 25.0000 mg | ORAL_CAPSULE | Freq: Every day | ORAL | Status: DC

## 2013-01-07 MED ORDER — LOPERAMIDE HCL 2 MG PO CAPS: 2.0000 mg | ORAL_CAPSULE | ORAL | Status: AC | PRN

## 2013-01-07 MED ORDER — AMOXICILLIN-POT CLAVULANATE 875-125 MG PO TABS: 1.0000 | ORAL_TABLET | Freq: Two times a day (BID) | ORAL | Status: DC

## 2013-01-07 MED ORDER — VITAMIN B-1 100 MG PO TABS
100.0000 mg | ORAL_TABLET | Freq: Every day | ORAL | Status: DC
  Administered 2013-01-08 – 2013-01-12 (×5): 100 mg via ORAL
  Filled 2013-01-07 (×7): qty 1

## 2013-01-07 MED ORDER — THIAMINE HCL 100 MG/ML IJ SOLN: 100.0000 mg | Freq: Once | INTRAMUSCULAR | Status: DC

## 2013-01-07 MED ORDER — ACETAMINOPHEN 325 MG PO TABS: 650.0000 mg | ORAL_TABLET | Freq: Four times a day (QID) | ORAL | Status: DC | PRN

## 2013-01-07 MED ORDER — ALUM & MAG HYDROXIDE-SIMETH 200-200-20 MG/5ML PO SUSP: 30.0000 mL | ORAL | Status: DC | PRN

## 2013-01-07 MED ORDER — ONDANSETRON 4 MG PO TBDP: 4.0000 mg | ORAL_TABLET | Freq: Four times a day (QID) | ORAL | Status: AC | PRN

## 2013-01-07 MED ORDER — NICOTINE 21 MG/24HR TD PT24
21.0000 mg | MEDICATED_PATCH | Freq: Every day | TRANSDERMAL | Status: DC
  Administered 2013-01-08 – 2013-01-12 (×5): 21 mg via TRANSDERMAL
  Filled 2013-01-07 (×7): qty 1

## 2013-01-07 MED ORDER — CHLORDIAZEPOXIDE HCL 25 MG PO CAPS
25.0000 mg | ORAL_CAPSULE | Freq: Four times a day (QID) | ORAL | Status: AC
  Administered 2013-01-07 – 2013-01-08 (×3): 25 mg via ORAL
  Filled 2013-01-07 (×4): qty 1

## 2013-01-07 MED ORDER — HYDROXYZINE HCL 25 MG PO TABS
25.0000 mg | ORAL_TABLET | Freq: Four times a day (QID) | ORAL | Status: AC | PRN
  Administered 2013-01-08 – 2013-01-10 (×4): 25 mg via ORAL

## 2013-01-07 MED ORDER — ADULT MULTIVITAMIN W/MINERALS CH
1.0000 | ORAL_TABLET | Freq: Every day | ORAL | Status: DC
  Administered 2013-01-08 – 2013-01-12 (×5): 1 via ORAL
  Filled 2013-01-07 (×8): qty 1

## 2013-01-07 MED ORDER — INSULIN ASPART 100 UNIT/ML ~~LOC~~ SOLN
0.0000 [IU] | Freq: Three times a day (TID) | SUBCUTANEOUS | Status: DC
  Administered 2013-01-08 (×2): 3 [IU] via SUBCUTANEOUS
  Administered 2013-01-09: 2 [IU] via SUBCUTANEOUS
  Administered 2013-01-09: 3 [IU] via SUBCUTANEOUS
  Administered 2013-01-09: 15 [IU] via SUBCUTANEOUS
  Administered 2013-01-10: 8 [IU] via SUBCUTANEOUS
  Administered 2013-01-10: 5 [IU] via SUBCUTANEOUS
  Administered 2013-01-10: 15 [IU] via SUBCUTANEOUS
  Administered 2013-01-11: 3 [IU] via SUBCUTANEOUS
  Administered 2013-01-11: 15 [IU] via SUBCUTANEOUS
  Administered 2013-01-12: 5 [IU] via SUBCUTANEOUS
  Administered 2013-01-12: 3 [IU] via SUBCUTANEOUS

## 2013-01-07 NOTE — Progress Notes (Signed)
Inpatient Diabetes Program Recommendations  AACE/ADA: New Consensus Statement on Inpatient Glycemic Control (2013)  Target Ranges:  Prepandial:   less than 140 mg/dL      Peak postprandial:   less than 180 mg/dL (1-2 hours)      Critically ill patients:  140 - 180 mg/dL     Results for VALLERI, HENDRICKSEN (MRN [id number]) as of 01/07/2013 11:08  Ref. Range 01/06/2013 08:10 01/06/2013 11:59 01/06/2013 16:47 01/06/2013 22:11  Glucose-Capillary Latest Range: 70-99 mg/dL 829 (H) 562 (H) 130 (H) 398 (H)    Results for RIKI, BERNINGER (MRN [id number]) as of 01/07/2013 11:08  Ref. Range 01/07/2013 07:59  Glucose-Capillary Latest Range: 70-99 mg/dL 295 (H)    **Patient having elevated fasting glucose levels and elevated postprandial glucose levels   Inpatient Diabetes Program Recommendations Insulin - Basal: MD- Please add Lantus 10 units QHS Insulin - Meal Coverage: MD- Please add scheduled Novolog meal coverage- Novolog 4 units tid with meals   Will follow. Ambrose Finland RN, MSN, CDE Diabetes Coordinator Inpatient Diabetes Program [phone]

## 2013-01-07 NOTE — Clinical Social Work Psych Note (Signed)
Psych CSW reviewed chart and spoke with unit CSW.  Pt has been accepted to [facility].  Pt is voluntary.  Psych CSW will assess pt willingness to go to [facility] and assist with discharge.  Vickii Penna, LCSWA [phone]  Clinical Social Work

## 2013-01-07 NOTE — Progress Notes (Signed)
Pt transferred to behavioral health per MD order. Pt transported by security. Sitter NT accompanied pt. All belongings and medication sent with security.  Pt alert and oriented at time of transfer. Report given to receiving RN, Brittney. Joylene Grapes

## 2013-01-07 NOTE — BHH Counselor (Addendum)
Writer consulted with the social worker Almira Coaster).  Writer was informed by Almira Coaster that she would be faxing the support paperwork for the patient to the [facility] Office. Writer informed the [facility] Inetta Fermo) that the patients support paperwork would be faxed to [facility] before the patient is transported to [facility]

## 2013-01-07 NOTE — Clinical Social Work Psych Note (Signed)
Psych CSW assessed pt at bedside and facilitated d/c to [facility].  Full assessment will follow.  Vickii Penna, LCSWA [phone]  Clinical Social Work

## 2013-01-07 NOTE — Discharge Summary (Signed)
Physician Discharge Summary  Kathleen [name] NWG:[id number] DOB: 11-Aug-1965 DOA: 01/03/2013  PCP: No PCP Per Patient  Admit date: 01/03/2013 Discharge date: 01/07/2013  Time spent: 40 minutes  Recommendations for Outpatient Follow-up:  1. Follow up with PCP in 1 week after discharge.  Discharge Diagnoses:  Principal Problem:   Acute bronchitis Active Problems:   Cocaine abuse   Suicidal ideation   Homicidal ideation   Insulin dependent type 2 diabetes mellitus, uncontrolled   Discharge Condition: Stable  Diet recommendation: Regular  There were no vitals filed for this visit.  History of present illness:  Kathleen [name] is a 47 y.o. female with past medical history of PTSD, and that and diabetes mellitus and seizure. Came in to the hospital 2 days ago because of cocaine abuse and suicidal/homicidal ideation. Patient was seen by ACT team and recommended admission to behavior [facility], while she was waiting for the bed she developed fever of 102.2. Patient mentioned some cough and sputum production, denies any dysuria. Chest x-ray showed some atelectasis but no pneumonia, urinalysis showed 11-20 WBCs. Patient admitted to the hospital because of fever and patient also developed hypotension with blood pressure of 85/69  Hospital Course:   1. Acute bronchitis: While patient waiting for placement in the [facility] she developed fever of 102.2, cough and sputum production as well as some shortness of breath. Patient has no wheezes, chest x-ray showed atelectasis but no infiltrates of support presence of pneumonia. Urinalysis showed 11-20 WBC, but she denies any UTI symptoms. Patient treated as acute bronchitis initially with Levaquin but because of other psychotropic medication causing QT prolongation this was switched to Augmentin. Supportive management with bronchodilators, antitussives, mucolytics and oxygen was provided. On the day of discharge patient to complete 5 more  days of Augmentin, Mucinex for 5 more days as well.  2. Diabetes mellitus type 2: Carbohydrate modified diet, insulin sliding scale was utilized while she is in the hospital, patient listed as she uses NovoLog insulin at home, her hemoglobin A1c is 5.5, indicating tight glycemic control and controlled DM 2. No changes done to her regimen.  3. Cocaine abuse: Patient mentioned cocaine abuse, patient was seen by psych and psych social work patient is going to be admitted to psych inpatient ward.  4. Suicidal/homicidal ideation: As mentioned above patient was waiting on bed in the behavioral [facility]. She is medical stable she defervesced, and I think she is medical stable to go to the behavioral health hospital today.  Procedures:  None  Consultations:  Psych  Discharge Exam: Filed Vitals:   01/07/13 0553  BP: 103/52  Pulse: 107  Temp: 99.1 F (37.3 C)  Resp: 20   General: Alert and awake, oriented x3, not in any acute distress. HEENT: anicteric sclera, pupils reactive to light and accommodation, EOMI CVS: S1-S2 clear, no murmur rubs or gallops Chest: clear to auscultation bilaterally, no wheezing, rales or rhonchi Abdomen: soft nontender, nondistended, normal bowel sounds, no organomegaly Extremities: no cyanosis, clubbing or edema noted bilaterally Neuro: Cranial nerves II-XII intact, no focal neurological deficits  Discharge Instructions  Discharge Orders   Future Orders Complete By Expires   Increase activity slowly  As directed        Medication List         albuterol 108 (90 BASE) MCG/ACT inhaler  Commonly known as:  PROVENTIL HFA;VENTOLIN HFA  Inhale 2 puffs into the lungs every 6 (six) hours as needed. shortness of breath  ALPRAZolam 0.5 MG tablet  Commonly known as:  XANAX  Take 0.5 mg by mouth 3 (three) times daily.     amoxicillin-clavulanate 875-125 MG per tablet  Commonly known as:  AUGMENTIN  Take 1 tablet by mouth every 12 (twelve) hours.      chlorproMAZINE 100 MG tablet  Commonly known as:  THORAZINE  Take 100 mg by mouth 2 (two) times daily.     guaiFENesin 600 MG 12 hr tablet  Commonly known as:  MUCINEX  Take 2 tablets (1,200 mg total) by mouth 2 (two) times daily.     insulin aspart 100 UNIT/ML injection  Commonly known as:  novoLOG  Inject 2 Units into the skin 3 (three) times daily before meals. Sliding scale     QUEtiapine 400 MG tablet  Commonly known as:  SEROQUEL  Take 400 mg by mouth 2 (two) times daily.       Allergies  Allergen Reactions  . Peach Flavor Rash      The results of significant diagnostics from this hospitalization (including imaging, microbiology, ancillary and laboratory) are listed below for reference.    Significant Diagnostic Studies: Dg Chest 2 View  01/04/2013   *RADIOLOGY REPORT*  Clinical Data: Fever  CHEST - 2 VIEW  Comparison: 07/03/2011  Findings: Hypoventilation with bibasilar atelectasis.  Negative for heart failure or effusion.  Heart size is normal.  IMPRESSION: Moderate bibasilar atelectasis.   Original Report Authenticated By: Janeece Riggers, M.D.    Microbiology: No results found for this or any previous visit (from the past 240 hour(s)).   Labs: Basic Metabolic Panel:  Recent Labs Lab 01/03/13 1157 01/04/13 1912 01/07/13 0544  NA 137 143 134*  K 3.5 4.2 4.3  CL 101 104 103  CO2 25 26 20   GLUCOSE 87 125* 252*  BUN 17 20 12   CREATININE 1.09 1.04 0.79  CALCIUM 9.4 9.4 9.3   Liver Function Tests:  Recent Labs Lab 01/03/13 1157 01/04/13 1912  AST 17 18  ALT 24 22  ALKPHOS 47 52  BILITOT 0.4 0.1*  PROT 7.9 7.7  ALBUMIN 3.8 3.7   No results found for this basename: LIPASE, AMYLASE,  in the last 168 hours No results found for this basename: AMMONIA,  in the last 168 hours CBC:  Recent Labs Lab 01/03/13 1157 01/04/13 1912 01/07/13 0544  WBC 10.2 10.7* 10.8*  HGB 12.8 13.4 11.0*  HCT 35.4* 36.9 31.0*  MCV 69.3* 69.6* 69.4*  PLT 246 252 223    Cardiac Enzymes: No results found for this basename: CKTOTAL, CKMB, CKMBINDEX, TROPONINI,  in the last 168 hours BNP: BNP (last 3 results) No results found for this basename: PROBNP,  in the last 8760 hours CBG:  Recent Labs Lab 01/06/13 1159 01/06/13 1647 01/06/13 2211 01/07/13 0338 01/07/13 0759  GLUCAP 200* 254* 398* 347* 221*       Signed:  Shyheem Whitham A  [facility] 01/07/2013, 10:31 AM

## 2013-01-07 NOTE — Care Management Note (Signed)
    Page 1 of 1   01/07/2013     11:30:53 AM   CARE MANAGEMENT NOTE 01/07/2013  Patient:  Kathleen [name], Kathleen [name]   Account Number:  [id number]  Date Initiated:  01/07/2013  Documentation initiated by:  GRAVES-BIGELOW,Ariba Lehnen  Subjective/Objective Assessment:   Pt admitted for fever. Plan for [facility] transfer when medically stable. Per notes pt has bed available. CSW Almira Coaster is working with pt for disposition needs.     Action/Plan:   No needs from CM at this time.   Anticipated DC Date:  01/07/2013   Anticipated DC Plan:  [facility]  In-house referral  Clinical Social Worker      DC Planning Services  CM consult      Choice offered to / List presented to:             Status of service:  Completed, signed off Medicare Important Message given?   (If response is "NO", the following Medicare IM given date fields will be blank) Date Medicare IM given:   Date Additional Medicare IM given:    Discharge Disposition:  [facility]CHIATRIC [facility]OSPITAL  Per UR Regulation:  Reviewed for med. necessity/level of care/duration of stay  If discussed at Long Length of Stay Meetings, dates discussed:    Comments:

## 2013-01-07 NOTE — Tx Team (Signed)
Initial Interdisciplinary Treatment Plan  PATIENT STRENGTHS: (choose at least two) Ability for insight Average or above average intelligence Capable of independent living General fund of knowledge  PATIENT STRESSORS: Substance abuse   PROBLEM LIST: Problem List/Patient Goals Date to be addressed Date deferred Reason deferred Estimated date of resolution  Depression 01/07/2013     Substance Abuse 01/07/2013                                                DISCHARGE CRITERIA:  Improved stabilization in mood, thinking, and/or behavior Need for constant or close observation no longer present Withdrawal symptoms are absent or subacute and managed without 24-hour nursing intervention  PRELIMINARY DISCHARGE PLAN: Outpatient therapy Return to previous living arrangement  PATIENT/FAMIILY INVOLVEMENT: This treatment plan has been presented to and reviewed with the patient, Kathleen [name].  The patient and family have been given the opportunity to ask questions and make suggestions.  Leighton Parody M 01/07/2013, 6:01 PM

## 2013-01-07 NOTE — Progress Notes (Signed)
Pt. Was upset when writer arrived because she stated that she had clothes that had been left at [facility].  Prior shift had already called [facility] and the clothes were still there and they had stated they would send them over via security.  Security was called again by Clinical research associate d/t pt. Being very agitated over the clothes. The clothes were sent and pt. Stated there were only jeans and pt. Still appeared angry but admitted that was all that she had brought.  Pt. Is very labile,  Laughing and talking one minute then agitated or appearing upset then immediately back to laughing.  Pt. Denies SI and HI, denies A or VH. NO complaints of pain or discomfort noted.

## 2013-01-08 DIAGNOSIS — F3110 Bipolar disorder, current episode manic without psychotic features, unspecified: Secondary | ICD-10-CM

## 2013-01-08 LAB — GLUCOSE, CAPILLARY
Glucose-Capillary: 170 mg/dL — ABNORMAL HIGH (ref 70–99)
Glucose-Capillary: 147 mg/dL — ABNORMAL HIGH (ref 70–99)

## 2013-01-08 MED ORDER — CHLORPROMAZINE HCL 100 MG PO TABS
100.0000 mg | ORAL_TABLET | Freq: Two times a day (BID) | ORAL | Status: DC
  Administered 2013-01-08 – 2013-01-10 (×4): 100 mg via ORAL
  Filled 2013-01-08: qty 1
  Filled 2013-01-08 (×2): qty 4
  Filled 2013-01-08: qty 1
  Filled 2013-01-08: qty 4
  Filled 2013-01-08 (×4): qty 1

## 2013-01-08 NOTE — Progress Notes (Signed)
D.  Pt pleasant on approach.  Pt states that she was getting Depakote and Seroquel before and would like to have these again.  She states that Trazodone gives her nightmares.  Pt would like doctor to review her medications tomorrow.  Denies SI/HI/hallucinations at this time.  Pt did not attend evening wrap up group.  A.  Support and encouragement offered.  Note for doctor put in sticky notes for Pt regarding her medications  R.  Pt remains safe on unit, will continue to monitor.
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
## 2013-01-08 NOTE — Progress Notes (Signed)
Patient ID: Kathleen [name], female   DOB: 22-Sep-1965, 47 y.o.   MRN: [id number]  D: Pt has been very flat and depressed on the unit. Pt has been in the bed most of the day, and has not attended any groups and has not engaged in treatment . Pt reported that she was sick, and that she just wanted to be left alone.  Pt reported being negative SI/HI, no AH/VH noted. A: 15 minute checks continued for patient safety. R: Pts safety maintained.

## 2013-01-08 NOTE — Progress Notes (Signed)
Psychoeducational Group Note  Date:  01/08/2013 Time:  0945 am  Group Topic/Focus:  Identifying Needs:   The focus of this group is to help patients identify their personal needs that have been historically problematic and identify healthy behaviors to address their needs.  Participation Level:  Did Not Attend  Andrena Mews 01/08/2013,9:59 AM

## 2013-01-08 NOTE — [facility] Suicide Risk Assessment (Signed)
Suicide Risk Assessment  Admission Assessment     Nursing information obtained from:  Patient Demographic factors:  Low socioeconomic status;Unemployed Current Mental Status:  Suicidal ideation indicated by patient Loss Factors:  Financial problems / change in socioeconomic status Historical Factors:  NA Risk Reduction Factors:  Sense of responsibility to family  CLINICAL FACTORS:   Severe Anxiety and/or Agitation Bipolar Disorder:   Depressive phase  COGNITIVE FEATURES THAT CONTRIBUTE TO RISK:  Closed-mindedness Polarized thinking    SUICIDE RISK:   Severe:  Frequent, intense, and enduring suicidal ideation, specific plan, no subjective intent, but some objective markers of intent (i.e., choice of lethal method), the method is accessible, some limited preparatory behavior, evidence of impaired self-control, severe dysphoria/symptomatology, multiple risk factors present, and few if any protective factors, particularly a lack of social support.  PLAN OF CARE:  I certify that inpatient services furnished can reasonably be expected to improve the patient's condition.  Drusilla Wampole 01/08/2013, 3:42 PM

## 2013-01-08 NOTE — [facility] Group Notes (Signed)
[facility] Group Notes:  (Nursing/MHT/Case Management/Adjunct)  Date:  01/08/2013  Time:  4:04 PM  Type of Therapy:  Psychoeducational Skills  Participation Level:  Did Not Attend  Participation Quality:  Appropriate  Affect:  Appropriate  Cognitive:  Appropriate  Insight:  Appropriate  Engagement in Group:  Engaged  Modes of Intervention:  Problem-solving  Summary of Progress/Problems: Pt attended healthy coping skills group, and was able to engage in treatment.   Jacquelyne Balint Shanta 01/08/2013, 4:04 PM

## 2013-01-08 NOTE — [facility] Group Notes (Signed)
[facility] Group Notes: (Clinical Social Work)   01/08/2013      Type of Therapy:  Group Therapy   Participation Level:  Did Not Attend    Ambrose Mantle, LCSW 01/08/2013, 1:12 PM

## 2013-01-09 DIAGNOSIS — F319 Bipolar disorder, unspecified: Secondary | ICD-10-CM

## 2013-01-09 DIAGNOSIS — F1420 Cocaine dependence, uncomplicated: Secondary | ICD-10-CM

## 2013-01-09 LAB — GLUCOSE, CAPILLARY
Glucose-Capillary: 197 mg/dL — ABNORMAL HIGH (ref 70–99)
Glucose-Capillary: 363 mg/dL — ABNORMAL HIGH (ref 70–99)

## 2013-01-09 MED ORDER — DIVALPROEX SODIUM ER 500 MG PO TB24
500.0000 mg | ORAL_TABLET | Freq: Two times a day (BID) | ORAL | Status: DC
  Administered 2013-01-09 – 2013-01-12 (×5): 500 mg via ORAL
  Filled 2013-01-09 (×12): qty 1

## 2013-01-09 NOTE — Progress Notes (Signed)
Psychoeducational Group Note  Date: 01/08/2013 Time:  2100  Group Topic/Focus:  wrap up group  Participation Level: Did Not Attend  Participation Quality:  Not Applicable  Affect:  Not Applicable  Cognitive:  Not Applicable  Insight:  Not Applicable  Engagement in Group: Not Applicable  Additional Comments:  Pt remained in bed during group time.   Shelah Lewandowsky 01/09/2013, 3:12 AM

## 2013-01-09 NOTE — [facility] Group Notes (Signed)
[facility] Group Notes:  (Nursing/MHT/Case Management/Adjunct)  Date:  01/09/2013  Time:  11:37 AM  Type of Therapy:  Psychoeducational Skills  Participation Level:  Did Not Attend    Buford Dresser 01/09/2013, 11:37 AM

## 2013-01-09 NOTE — Progress Notes (Signed)
[facility] MD Progress Note  01/09/2013 5:39 PM Kathleen [name]  MRN:  [id number]  Subjective: Kathleen [name] reports, "My anxiety is high, my mood is swinging on me. I need my medicine adjusted or I'm gonna flip. The Thorazine normally works for me, so is Seroquel. I have biploar disease, my mood is not right".  Diagnosis:   DSM5: Schizophrenia Disorders:  NA Obsessive-Compulsive Disorders:  NA Trauma-Stressor Disorders:  NA Substance/Addictive Disorders:  Cocaine abuse Depressive Disorders:  Bipolar affective disorder  Axis I: Bipolar affective disorder, Cocaine dependence Axis II: Deferred Axis III:  Past Medical History  Diagnosis Date  . Anxiety   . Post traumatic stress disorder   . Schizoaffective disorder, bipolar type   . Asthma   . Headache(784.0)   . Cocaine-induced mental disorder     psychosis  . 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 Seizures 01/03/2013    Most recent 12/31/2012, per pt report.  Marland Kitchen COPD (chronic obstructive pulmonary disease)    Axis IV: other psychosocial or environmental problems and Substance abuse issues, chronic mental illness Axis V: 41-50 serious symptoms  ADL's:  Intact  Sleep: Fair  Appetite:  Fair  Suicidal Ideation:  Plan:  Denies Intent:  Denies Means:  Denies  Homicidal Ideation:  Plan:  Denies Intent:  Denies Means:  DeiesDenies  AEB (as evidenced by):  Psychiatric Specialty Exam: Review of Systems  Constitutional: Negative.   HENT: Negative.   Eyes: Negative.   Respiratory: Negative.   Cardiovascular: Negative.   Genitourinary: Negative.   Musculoskeletal: Negative.   Skin: Negative.   Neurological: Negative.   Endo/Heme/Allergies: Negative.   Psychiatric/Behavioral: Positive for depression and substance abuse. Negative for suicidal ideas, hallucinations and memory loss. The patient is nervous/anxious and has insomnia.     Blood pressure 95/64, pulse 116, temperature 99 F (37.2 C), temperature source Oral,  resp. rate 24, height 5\' 3"  (1.6 m), weight 83.008 kg (183 lb), last menstrual period 12/27/2012.Body mass index is 32.43 kg/(m^2).  General Appearance: Fairly Groomed  Patent attorney::  Good  Speech:  Clear and Coherent  Volume:  Normal  Mood:  Anxious, Depressed and Irritable  Affect:  Flat  Thought Process:  Coherent, Goal Directed and Intact  Orientation:  Full (Time, Place, and Person)  Thought Content:  Rumination  Suicidal Thoughts:  No  Homicidal Thoughts:  No  Memory:  Immediate;   Good Recent;   Good Remote;   Good  Judgement:  Fair  Insight:  Fair  Psychomotor Activity:  Restlessness  Concentration:  Poor  Recall:  Good  Akathisia:  No  Handed:  Right  AIMS (if indicated):     Assets:  Desire for Improvement  Sleep:  Number of Hours: 6   Current Medications: Current Facility-Administered Medications  Medication Dose Route Frequency Provider Last Rate Last Dose  . acetaminophen (TYLENOL) tablet 650 mg  650 mg Oral Q6H PRN Sanjuana Kava, NP      . albuterol (PROVENTIL HFA;VENTOLIN HFA) 108 (90 BASE) MCG/ACT inhaler 2 puff  2 puff Inhalation Q4H PRN Sanjuana Kava, NP   2 puff at 01/07/13 2208  . alum & mag hydroxide-simeth (MAALOX/MYLANTA) 200-200-20 MG/5ML suspension 30 mL  30 mL Oral Q4H PRN Sanjuana Kava, NP      . chlordiazePOXIDE (LIBRIUM) capsule 25 mg  25 mg Oral Q6H PRN Sanjuana Kava, NP   25 mg at 01/09/13 1700  . chlordiazePOXIDE (LIBRIUM) capsule 25 mg  25 mg Oral  Once Sanjuana Kava, NP      . chlordiazePOXIDE (LIBRIUM) capsule 25 mg  25 mg Oral BH-qamhs Sanjuana Kava, NP       Followed by  . [START ON 01/11/2013] chlordiazePOXIDE (LIBRIUM) capsule 25 mg  25 mg Oral Daily Sanjuana Kava, NP      . chlorproMAZINE (THORAZINE) tablet 100 mg  100 mg Oral BID Sanjuana Kava, NP   100 mg at 01/09/13 1700  . divalproex (DEPAKOTE ER) 24 hr tablet 500 mg  500 mg Oral BID Sanjuana Kava, NP      . hydrOXYzine (ATARAX/VISTARIL) tablet 25 mg  25 mg Oral Q6H PRN Sanjuana Kava, NP    25 mg at 01/08/13 1610  . insulin aspart (novoLOG) injection 0-15 Units  0-15 Units Subcutaneous TID WC Sanjuana Kava, NP   15 Units at 01/09/13 1659  . insulin aspart (novoLOG) injection 2 Units  2 Units Subcutaneous TID AC Sanjuana Kava, NP   2 Units at 01/09/13 1659  . loperamide (IMODIUM) capsule 2-4 mg  2-4 mg Oral PRN Sanjuana Kava, NP      . magnesium hydroxide (MILK OF MAGNESIA) suspension 30 mL  30 mL Oral Daily PRN Sanjuana Kava, NP      . multivitamin with minerals tablet 1 tablet  1 tablet Oral Daily Sanjuana Kava, NP   1 tablet at 01/09/13 0736  . nicotine (NICODERM CQ - dosed in mg/24 hours) patch 21 mg  21 mg Transdermal Q0600 Sanjuana Kava, NP   21 mg at 01/09/13 0607  . ondansetron (ZOFRAN-ODT) disintegrating tablet 4 mg  4 mg Oral Q6H PRN Sanjuana Kava, NP      . QUEtiapine (SEROQUEL) tablet 400 mg  400 mg Oral BID Sanjuana Kava, NP   400 mg at 01/09/13 1700  . thiamine (B-1) injection 100 mg  100 mg Intramuscular Once Sanjuana Kava, NP      . thiamine (VITAMIN B-1) tablet 100 mg  100 mg Oral Daily Sanjuana Kava, NP   100 mg at 01/09/13 0736  . traZODone (DESYREL) tablet 50 mg  50 mg Oral QHS PRN Sanjuana Kava, NP   50 mg at 01/07/13 2206    Lab Results:  Results for orders placed during the hospital encounter of 01/07/13 (from the past 48 hour(s))  GLUCOSE, CAPILLARY     Status: Abnormal   Collection Time    01/07/13  9:12 PM      Result Value Range   Glucose-Capillary 139 (*) 70 - 99 mg/dL  GLUCOSE, CAPILLARY     Status: Abnormal   Collection Time    01/08/13  6:09 AM      Result Value Range   Glucose-Capillary 170 (*) 70 - 99 mg/dL   Comment 1 Notify RN     Comment 2 Documented in Chart    GLUCOSE, CAPILLARY     Status: Abnormal   Collection Time    01/08/13 11:57 AM      Result Value Range   Glucose-Capillary 131 (*) 70 - 99 mg/dL   Comment 1 Notify RN    GLUCOSE, CAPILLARY     Status: Abnormal   Collection Time    01/08/13  5:24 PM      Result Value Range    Glucose-Capillary 165 (*) 70 - 99 mg/dL   Comment 1 Notify RN    GLUCOSE, CAPILLARY     Status: Abnormal  Collection Time    01/08/13  9:09 PM      Result Value Range   Glucose-Capillary 147 (*) 70 - 99 mg/dL   Comment 1 Notify RN     Comment 2 Documented in Chart    GLUCOSE, CAPILLARY     Status: Abnormal   Collection Time    01/09/13  6:24 AM      Result Value Range   Glucose-Capillary 197 (*) 70 - 99 mg/dL   Comment 1 Notify RN     Comment 2 Documented in Chart    GLUCOSE, CAPILLARY     Status: Abnormal   Collection Time    01/09/13 11:39 AM      Result Value Range   Glucose-Capillary 132 (*) 70 - 99 mg/dL   Comment 1 Notify RN    GLUCOSE, CAPILLARY     Status: Abnormal   Collection Time    01/09/13  4:55 PM      Result Value Range   Glucose-Capillary 363 (*) 70 - 99 mg/dL    Physical Findings: AIMS: Facial and Oral Movements Muscles of Facial Expression: None, normal Lips and Perioral Area: None, normal Jaw: None, normal Tongue: None, normal,Extremity Movements Upper (arms, wrists, hands, fingers): None, normal Lower (legs, knees, ankles, toes): None, normal, Trunk Movements Neck, shoulders, hips: None, normal, Overall Severity Severity of abnormal movements (highest score from questions above): None, normal Incapacitation due to abnormal movements: None, normal Patient's awareness of abnormal movements (rate only patient's report): No Awareness, Dental Status Current problems with teeth and/or dentures?: No Does patient usually wear dentures?: No  CIWA:  CIWA-Ar Total: 0 COWS:     Treatment Plan Summary: Daily contact with patient to assess and evaluate symptoms and progress in treatment Medication management  Plan: Supportive approach/coping skills/relapse prevention. Initiate Depakote ER 500 mg bid for mood stabilization. Obtain Depakote levels on 01/12/13 Encouraged out of room, participation in group sessions and application of coping skills when  distressed. Will continue to monitor response to/adverse effects of medications in use to assure effectiveness. Continue to monitor mood, behavior and interaction with staff and other patients. Continue current plan of care.  Medical Decision Making Problem Points:  Review of last therapy session (1) and Review of psycho-social stressors (1) Data Points:  Review of medication regiment & side effects (2) Review of new medications or change in dosage (2)  I certify that inpatient services furnished can reasonably be expected to improve the patient's condition.   Armandina Stammer I, PMHNP-BC 01/09/2013, 5:39 PM

## 2013-01-09 NOTE — [facility] Group Notes (Signed)
[facility] Group Notes: (Clinical Social Work)   01/09/2013      Type of Therapy:  Group Therapy   Participation Level:  Did Not Attend    Chastity Noland Grossman-Orr, LCSW 01/09/2013, 1:01 PM     

## 2013-01-09 NOTE — Progress Notes (Addendum)
D.  Pt in bed most of shift, did not attend group.  Pt did however get up for snacks and requested many.  MHT explained to Pt that her blood sugar was 322 and she should not have as many sugar snacks as she wished to have, Pt insistent.  Denies SI/HI/hallucinations at this time.  A.  Support and encouragement offered.  Spoke to Pt and Pt states that there are not many available diabetic snacks and she is extremely hungry.   R.  Pt remains safe on unit, will continue to monitor.

## 2013-01-09 NOTE — Progress Notes (Signed)
Patient ID: Kathleen [name], female   DOB: Jan 09, 1966, 48 y.o.   MRN: [id number]  D: Pt continues to be very flat and depressed on the unit. Pt has been in the bed most of the day, and has not attended any groups and has not engaged in treatment . Pt continues to report that she is sick, and that she just wanted to be left alone. Pt also reported that she needs for the doctor to put her back on her Depakote, and to increase her Seroquel at night. Aggie NP made aware of patients complaints, no new orders noted. Pt reported being negative SI/HI, no AH/VH noted. A: 15 minute checks continued for patient safety. R: Pts safety maintained.

## 2013-01-09 NOTE — Progress Notes (Signed)
Didn't attended AA 

## 2013-01-09 NOTE — [facility] Group Notes (Signed)
[facility] Group Notes:  (Nursing/MHT/Case Management/Adjunct)  Date:  01/09/2013  Time:  3:35 PM  Type of Therapy:  Psychoeducational Skills  Participation Level:  Active   Kathleen [name] 01/09/2013, 3:35 PM

## 2013-01-10 DIAGNOSIS — F1420 Cocaine dependence, uncomplicated: Secondary | ICD-10-CM | POA: Diagnosis present

## 2013-01-10 DIAGNOSIS — F39 Unspecified mood [affective] disorder: Secondary | ICD-10-CM | POA: Diagnosis present

## 2013-01-10 LAB — GLUCOSE, CAPILLARY
Glucose-Capillary: 248 mg/dL — ABNORMAL HIGH (ref 70–99)
Glucose-Capillary: 243 mg/dL — ABNORMAL HIGH (ref 70–99)
Glucose-Capillary: 290 mg/dL — ABNORMAL HIGH (ref 70–99)

## 2013-01-10 MED ORDER — CHLORPROMAZINE HCL 100 MG PO TABS
100.0000 mg | ORAL_TABLET | Freq: Three times a day (TID) | ORAL | Status: DC
  Administered 2013-01-10 – 2013-01-11 (×4): 100 mg via ORAL
  Filled 2013-01-10 (×9): qty 1

## 2013-01-10 MED ORDER — INSULIN ASPART 100 UNIT/ML ~~LOC~~ SOLN
10.0000 [IU] | Freq: Once | SUBCUTANEOUS | Status: AC
  Administered 2013-01-10: 10 [IU] via SUBCUTANEOUS

## 2013-01-10 NOTE — Progress Notes (Signed)
Adult Psychoeducational Group Note  Date:  01/10/2013 Time:  11:00AM Group Topic/Focus:  Wellness Toolbox:   The focus of this group is to discuss various aspects of wellness, balancing those aspects and exploring ways to increase the ability to experience wellness.  Patients will create a wellness toolbox for use upon discharge.  Participation Level:  Active  Participation Quality:  Appropriate and Attentive  Affect:  Appropriate  Cognitive:  Alert and Appropriate  Insight: Appropriate  Engagement in Group:  Engaged  Modes of Intervention:  Discussion  Additional Comments: Pt. Was attentive and appropriate during today's group discussion. Pt. Was able to complete self care assessment. Pt shared that she need to improve her physical self care. Pt. Shared how she don't eat healthy and that she is a diabetic and how she eat everything she want.   Bing Plume D 01/10/2013, 1:11 PM

## 2013-01-10 NOTE — Progress Notes (Signed)
Inpatient Diabetes Program Recommendations  AACE/ADA: New Consensus Statement on Inpatient Glycemic Control (2013)  Target Ranges:  Prepandial:   less than 140 mg/dL      Peak postprandial:   less than 180 mg/dL (1-2 hours)      Critically ill patients:  140 - 180 mg/dL     Results for Kathleen [name], Kathleen [name] (MRN [id number]) as of 01/10/2013 09:59  Ref. Range 01/09/2013 06:24 01/09/2013 11:39 01/09/2013 16:55 01/09/2013 21:23  Glucose-Capillary Latest Range: 70-99 mg/dL 409 (H) 811 (H) 914 (H) 322 (H)    Results for Kathleen [name], Kathleen [name] (MRN [id number]) as of 01/10/2013 09:59  Ref. Range 01/10/2013 03:13 01/10/2013 06:06  Glucose-Capillary Latest Range: 70-99 mg/dL 086 (H) 578 (H)    **Noted patient is having elevated fasting glucose levels while in-hospital  **MD- Please add basal insulin to improve glucose- Lantus 15 units qhs (0.2 units/kg dosing)   Will follow. Ambrose Finland RN, MSN, CDE Diabetes Coordinator Inpatient Diabetes Program [phone]

## 2013-01-10 NOTE — Progress Notes (Signed)
RN voiced concern over patient's confusion and lethargy. Librium was discontinued to help with this. Patient has been in patient since 01/03/2013 with possible concern for pneumonia. Sedative medication in a diabetic with COPD could be counter productive if not clearly needed.   Will continue to monitor for signs and symptoms of withdrawal and will be treated accordingly. [name]. Ashle Stief [facility] 01/10/2013 5:31 PM

## 2013-01-10 NOTE — Progress Notes (Signed)
Pt has become increasingly lethargic throughout the day, difficult to arouse for dinner, pt confused, unable to tell correct date, could not remember going to lunch today and stated that she was upset because she thinks we didn't wake her up, Notified PA and Charge RN of pt condition.

## 2013-01-10 NOTE — [facility] Group Notes (Signed)
[facility] LCSW Aftercare Discharge Planning Group Note   01/10/2013 9:31 AM  Participation Quality:  Appropriate   Mood/Affect:  Appropriate  Depression Rating:  9  Anxiety Rating:  9  Thoughts of Suicide:  Yes Will you contract for safety?   Yes  Current AVH:  No  Plan for Discharge/Comments:  Pt reports that she was admitted after constant thoughts of SI and chronic depression. Pt lives with her mother (who is not supportive) in [place]. ([place]). Pt interested in [facility] and plans to followup at [facility] for med management.   Transportation Means: bus  Supports: none identified  Counselling psychologist, [employer]

## 2013-01-10 NOTE — [facility] Group Notes (Signed)
[facility] LCSW Group Therapy  01/10/2013 4:17 PM  Type of Therapy:  Group Therapy  Participation Level:  Did Not Attend  Smart, Kathleen [name] 01/10/2013, 4:17 PM
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
## 2013-01-10 NOTE — Clinical Social Work Psych Assess (Addendum)
Clinical Social Work Department CLINICAL SOCIAL WORK PSYCHIATRY SERVICE LINE ASSESSMENT 01/07/2013  Patient:  Kathleen [name]  Account:  [id number]  Admit Date:  07/14/2007  Clinical Social Worker:  Read Drivers  Date/Time:  01/07/2013 11:13 AM Referred by:  Physician  Date referred:  01/07/2013 Reason for Referral  Behavioral Health Issues   Presenting Symptoms/Problems (In the person's/family's own words):   Psych was consulted for depression; anxiety; SA; SI   Abuse/Neglect/Trauma History (check all that apply)  Denies history   Abuse/Neglect/Trauma Comments:   none reported or noted in chart   Psychiatric History (check all that apply)  Inpatient/hospitilization   Psychiatric medications:  LORazepam (ATIVAN) tablet 1 mg  Dose: 1 mg Freq: Every 8 hours PRN Route: PO  PRN Reason: anxiety  PRN Comment: agitation  Start: 01/03/13 1243 End: 01/07/13 1331   Current Mental Health Hospitalizations/Previous Mental Health History:   Pt has been seen in the [facility] ED x2 in the past 6 months.   Current provider:   unknown   Place and Date:   unknown   Current Medications:   acetaminophen (TYLENOL) tablet 650 mg  Dose: 650 mg Freq: Every 4 hours PRN Route: PO  PRN Reasons: fever,mild pain  Start: 01/04/13 1815 End: 01/07/13 1331   Admin Instructions:  Maximum dose of acetaminophen in 4000 mg from all sources in 24 hours.   1331-D/C'd    albuterol (PROVENTIL HFA;VENTOLIN HFA) 108 (90 BASE) MCG/ACT inhaler 2 puff  Dose: 2 puff Freq: Every 6 hours PRN Route: IN  PRN Reason: wheezing  Start: 01/03/13 1242 End: 01/07/13 1331   1331-D/C'd    alum & mag hydroxide-simeth (MAALOX/MYLANTA) 200-200-20 MG/5ML suspension 30 mL  Dose: 30 mL Freq: As needed Route: PO  PRN Reasons: indigestion,heartburn  Start: 01/03/13 1243 End: 01/07/13 1331   Admin Instructions:  Shake Well. Use for Maalox/Mylanta. Use for Regular and Extra Strength.   1331-D/C'd     guaiFENesin-dextromethorphan (ROBITUSSIN DM) 100-10 MG/5ML syrup 5 mL  Dose: 5 mL Freq: Every 4 hours PRN Route: PO  PRN Reason: cough  Start: 01/06/13 0934 End: 01/07/13 1331   1331-D/C'd    HYDROcodone-acetaminophen (NORCO/VICODIN) 5-325 MG per tablet 1-2 tablet  Dose: 1-2 tablet Freq: Every 4 hours PRN Route: PO  PRN Reason: moderate pain  Start: 01/06/13 0934 End: 01/07/13 1331   Admin Instructions:  Maximum dose of acetaminophen in 4000 mg from all sources in 24 hours.   1331-D/C'd    ibuprofen (ADVIL,MOTRIN) tablet 600 mg  Dose: 600 mg Freq: Every 8 hours PRN Route: PO  PRN Reason: pain  PRN Comment: Temp > 38.3Celsius  Start: 01/03/13 1243 End: 01/07/13 1331   1331-D/C'd    ipratropium (ATROVENT) nebulizer solution 0.5 mg  Dose: 0.5 mg Freq: Every 4 hours PRN Route: NEBULIZATION  PRN Reasons: wheezing,shortness of breath  Start: 01/06/13 1930 End: 01/07/13 1331   1331-D/C'd    LORazepam (ATIVAN) tablet 1 mg  Dose: 1 mg Freq: Every 8 hours PRN Route: PO  PRN Reason: anxiety  PRN Comment: agitation  Start: 01/03/13 1243 End: 01/07/13 1331   1331-D/C'd    ondansetron (ZOFRAN) tablet 4 mg  Dose: 4 mg Freq: Every 8 hours PRN Route: PO  PRN Reasons: nausea,vomiting  Start: 01/03/13 1243 End: 01/07/13 1331   1331-D/C'd    zolpidem (AMBIEN) tablet 5 mg  Dose: 5 mg Freq: At bedtime PRN Route: PO  PRN Reason: sleep  Start: 01/03/13 1243 End: 01/07/13 1331   1331-D/C'd  Medications 01/07/13 01/08/13 01/09/13 01/10/13 01/11/13 01/12/13 01/13/13   Previous Impatient Admission/Date/Reason:   unknown   Emotional Health / Current Symptoms    Suicide/Self Harm  Has a plan for suicide   Suicide attempt in the past:   none reported or noted in chart   Other harmful behavior:   none reported or noted in chart   Psychotic/Dissociative Symptoms  None reported   Other Psychotic/Dissociative Symptoms:   none reported or noted in chart    Attention/Behavioral  Symptoms  Withdrawn   Other Attention / Behavioral Symptoms:   guarded    Cognitive Impairment  Poor/Impaired Decision-Making  Poor Judgement   Other Cognitive Impairment:   none reported or noted in chart    Mood and Adjustment  Anxious  DEPRESSION  Guarded  Flat    Stress, Anxiety, Trauma, Any Recent Loss/Stressor  None reported   Anxiety (frequency):   pt has minimal situational anxiety   Phobia (specify):   none reported or noted in chart   Compulsive behavior (specify):   none reported or noted in chart   Obsessive behavior (specify):   none reported or noted in chart   Other:   none reported or noted in chart   Substance Abuse/Use  Substance abuse treatment needed  History of substance use  Current substance use   SBIRT completed (please refer for detailed history):  N  Self-reported substance use:   upon admission  UDS - positive for Cocaine and Benzos and BAL <11   Urinary Drug Screen Completed:  Y Alcohol level:   BAL <11    Environmental/Housing/Living Arrangement  Stable housing   Who is in the home:   significant other, Clinton   Emergency contact:  Sports administrator (phone number on facesheet)   Financial  Medicaid   Patient's Strengths and Goals (patient's own words):   Pt is compliant with medical advice while inpatient.  Pt has supportive friend at bedside.  Pt is willing to obtain treatment at [facility] as voluntary.   Clinical Social Worker's Interpretive Summary:   Psych CSW received consult fo SI; SA; depression and anxiety.  psych CSW assessed pt at bedside.  Psych CSW introduced self and Psych CSW role.  Pt was lying in bed, pleasant, but continues with flat/guarded affect.  Pt significant other, Clinton was also at bedside.  Pt requests he stay during the assessment.    Pt is voluntary and willing to receive tx at [facility].  Pt was alert and oriented x4.  Pt admits the continuation of SI. Pt denies HI.  Pt reports that her plan of suicide  would be an OD of meds or cocaine.  Pt has anchors - currently being a grandchild age 53 and a daughter.    Pt denies AVHD.  Pt reports cocaine usage for the past "few years" though not admitted to the specific # of years.    Psych CSW reviewed chart.  Pt was evaluated by Psychiatrist, Dr. Elsie Saas.  Dr. Elsie Saas recommends inpatient psychiatric treatment upon d/c.    Pt has been accepted to [facility] by Dr. Elsie Saas.  Bed assignment 303-1.  Psych CSW completed admission paperwork and faxed this to [facility] for their review.  Pt will be transported by security.  Sitter will accompany pt.  Pt, RN and MD aware.   Disposition:  Psych Clinical Social Worker signing off  Vickii Penna, [place] [phone]  Clinical Social Work
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
## 2013-01-10 NOTE — Progress Notes (Signed)
Adult Psychoeducational Group Note  Date:  01/10/2013 Time:  11:56 PM  Group Topic/Focus:  Wrap-Up Group:   The focus of this group is to help patients review their daily goal of treatment and discuss progress on daily workbooks.  Participation Level:  Minimal  Participation Quality:  Drowsy  Affect:  Blunted and Flat  Cognitive:  Disorganized  Insight: Limited  Engagement in Group:  Lacking  Modes of Intervention:  Exploration and Support  Additional Comments:  Pt was very drowsy and did not share anything other than she hoped to find "somewhere to go" when she leaves [facility].   Humberto Seals Monique 01/10/2013, 11:56 PM

## 2013-01-11 DIAGNOSIS — F411 Generalized anxiety disorder: Secondary | ICD-10-CM

## 2013-01-11 LAB — GLUCOSE, CAPILLARY
Glucose-Capillary: 106 mg/dL — ABNORMAL HIGH (ref 70–99)
Glucose-Capillary: 346 mg/dL — ABNORMAL HIGH (ref 70–99)

## 2013-01-11 MED ORDER — CHLORPROMAZINE HCL 50 MG PO TABS
50.0000 mg | ORAL_TABLET | Freq: Three times a day (TID) | ORAL | Status: DC
  Administered 2013-01-12 (×2): 50 mg via ORAL
  Filled 2013-01-11 (×7): qty 1

## 2013-01-11 NOTE — Progress Notes (Signed)
[facility] MD Progress Note  01/11/2013 5:03 PM Kathleen [name]  MRN:  [id number] Subjective:  Kathleen [name] has been very sedated during most of the day. She stated that she is committed to abstain from using crack. States that she gets it when she visits her mother where her uncle is. He gives it to her. When she was able to abstain for couple of years she avoided visiting her mother and feels that once she gets herself back together she will have to avoid them again.  Diagnosis:   DSM5: Schizophrenia Disorders:   Obsessive-Compulsive Disorders:   Trauma-Stressor Disorders:  Posttraumatic Stress Disorder (309.81) Substance/Addictive Disorders:  Cocaine Dependence Depressive Disorders:   Axis I: Anxiety Disorder NOS and Mood Disorder NOS  ADL's:  Intact  Sleep: sleeping during the day  Appetite:  Fair  Suicidal Ideation:  Plan:  denies Intent:  denies Means:  denies Homicidal Ideation:  Plan:  denies Intent:  denies Means:  denies AEB (as evidenced by):  Psychiatric Specialty Exam: Review of Systems  Constitutional: Negative.   HENT: Negative.   Eyes: Negative.   Respiratory: Positive for shortness of breath.   Cardiovascular: Negative.   Gastrointestinal: Negative.   Genitourinary: Negative.   Musculoskeletal: Negative.   Skin: Negative.   Neurological: Negative.   Endo/Heme/Allergies: Negative.   Psychiatric/Behavioral: Positive for substance abuse. The patient is nervous/anxious.     Blood pressure 116/80, pulse 110, temperature 97.8 F (36.6 C), temperature source Oral, resp. rate 20, height 5\' 3"  (1.6 m), weight 83.008 kg (183 lb), last menstrual period 12/27/2012.Body mass index is 32.43 kg/(m^2).  General Appearance: Disheveled  Eye Solicitor::  Fair  Speech:  Clear and Coherent  Volume:  Increased  Mood:  Anxious and worried  Affect:  Restricted  Thought Process:  Coherent and Goal Directed  Orientation:  Full (Time, Place, and Person)  Thought Content:  worries,  concerns  Suicidal Thoughts:  No  Homicidal Thoughts:  No  Memory:  Immediate;   Fair Recent;   Fair Remote;   Fair  Judgement:  Fair  Insight:  Shallow  Psychomotor Activity:  Restlessness  Concentration:  Fair  Recall:  Fair  Akathisia:  No  Handed:    AIMS (if indicated):     Assets:  Desire for Improvement  Sleep:  Number of Hours: 5.75   Current Medications: Current Facility-Administered Medications  Medication Dose Route Frequency Provider Last Rate Last Dose  . acetaminophen (TYLENOL) tablet 650 mg  650 mg Oral Q6H PRN Sanjuana Kava, NP      . albuterol (PROVENTIL HFA;VENTOLIN HFA) 108 (90 BASE) MCG/ACT inhaler 2 puff  2 puff Inhalation Q4H PRN Sanjuana Kava, NP   2 puff at 01/07/13 2208  . alum & mag hydroxide-simeth (MAALOX/MYLANTA) 200-200-20 MG/5ML suspension 30 mL  30 mL Oral Q4H PRN Sanjuana Kava, NP      . chlordiazePOXIDE (LIBRIUM) capsule 25 mg  25 mg Oral Once Sanjuana Kava, NP      . chlorproMAZINE (THORAZINE) tablet 100 mg  100 mg Oral TID Rachael Fee, MD   100 mg at 01/11/13 1019  . divalproex (DEPAKOTE ER) 24 hr tablet 500 mg  500 mg Oral BID Sanjuana Kava, NP   500 mg at 01/11/13 1020  . insulin aspart (novoLOG) injection 0-15 Units  0-15 Units Subcutaneous TID WC Sanjuana Kava, NP   15 Units at 01/11/13 1155  . insulin aspart (novoLOG) injection 2 Units  2 Units Subcutaneous TID  AC Sanjuana Kava, NP   2 Units at 01/11/13 1154  . magnesium hydroxide (MILK OF MAGNESIA) suspension 30 mL  30 mL Oral Daily PRN Sanjuana Kava, NP      . multivitamin with minerals tablet 1 tablet  1 tablet Oral Daily Sanjuana Kava, NP   1 tablet at 01/11/13 1020  . nicotine (NICODERM CQ - dosed in mg/24 hours) patch 21 mg  21 mg Transdermal Q0600 Sanjuana Kava, NP   21 mg at 01/11/13 0645  . QUEtiapine (SEROQUEL) tablet 400 mg  400 mg Oral BID Sanjuana Kava, NP   400 mg at 01/11/13 0800  . thiamine (B-1) injection 100 mg  100 mg Intramuscular Once Sanjuana Kava, NP      . thiamine  (VITAMIN B-1) tablet 100 mg  100 mg Oral Daily Sanjuana Kava, NP   100 mg at 01/11/13 1021    Lab Results:  Results for orders placed during the hospital encounter of 01/07/13 (from the past 48 hour(s))  GLUCOSE, CAPILLARY     Status: Abnormal   Collection Time    01/09/13  9:23 PM      Result Value Range   Glucose-Capillary 322 (*) 70 - 99 mg/dL   Comment 1 Notify RN     Comment 2 Documented in Chart    GLUCOSE, CAPILLARY     Status: Abnormal   Collection Time    01/10/13  3:13 AM      Result Value Range   Glucose-Capillary 248 (*) 70 - 99 mg/dL   Comment 1 Notify RN     Comment 2 Documented in Chart    GLUCOSE, CAPILLARY     Status: Abnormal   Collection Time    01/10/13  6:06 AM      Result Value Range   Glucose-Capillary 243 (*) 70 - 99 mg/dL   Comment 1 Notify RN     Comment 2 Documented in Chart    GLUCOSE, CAPILLARY     Status: Abnormal   Collection Time    01/10/13 11:24 AM      Result Value Range   Glucose-Capillary 379 (*) 70 - 99 mg/dL  GLUCOSE, CAPILLARY     Status: Abnormal   Collection Time    01/10/13  4:47 PM      Result Value Range   Glucose-Capillary 290 (*) 70 - 99 mg/dL  GLUCOSE, CAPILLARY     Status: Abnormal   Collection Time    01/10/13  9:15 PM      Result Value Range   Glucose-Capillary 373 (*) 70 - 99 mg/dL   Comment 1 Notify RN    GLUCOSE, CAPILLARY     Status: Abnormal   Collection Time    01/11/13  6:10 AM      Result Value Range   Glucose-Capillary 106 (*) 70 - 99 mg/dL   Comment 1 Notify RN    GLUCOSE, CAPILLARY     Status: Abnormal   Collection Time    01/11/13 11:18 AM      Result Value Range   Glucose-Capillary 346 (*) 70 - 99 mg/dL   Comment 1 Notify RN      Physical Findings: AIMS: Facial and Oral Movements Muscles of Facial Expression: None, normal Lips and Perioral Area: None, normal Jaw: None, normal Tongue: None, normal,Extremity Movements Upper (arms, wrists, hands, fingers): None, normal Lower (legs, knees,  ankles, toes): None, normal, Trunk Movements Neck, shoulders, hips: None, normal, Overall Severity  Severity of abnormal movements (highest score from questions above): None, normal Incapacitation due to abnormal movements: None, normal Patient's awareness of abnormal movements (rate only patient's report): No Awareness, Dental Status Current problems with teeth and/or dentures?: No Does patient usually wear dentures?: No  CIWA:  CIWA-Ar Total: 1 COWS:     Treatment Plan Summary: Daily contact with patient to assess and evaluate symptoms and progress in treatment Medication management  Plan: Supportive approach/coping skills/relapse prevention           Decrease the Thorazine           Optimize treatment with psychotropics  Medical Decision Making Problem Points:  Review of psycho-social stressors (1) Data Points:  Review of medication regiment & side effects (2)  I certify that inpatient services furnished can reasonably be expected to improve the patient's condition.   Virgina Deakins A 01/11/2013, 5:03 PM

## 2013-01-11 NOTE — Progress Notes (Signed)
Patient ID: Kathleen [name], female   DOB: May 18, 1965, 47 y.o.   MRN: [id number] D: Pt. Get up from bed and goes to the dayroom and asks if she missed breakfast. Staff orients pt. Pt. Noticed to be groggy.A: Writer made fall risk, initiated red sock, yellow band and sign on door. Writer encouraged group.Staff will monitor q27min for safety. R: Pt. Is safe on the unit, attended group.

## 2013-01-11 NOTE — Progress Notes (Signed)
The focus of this group is to educate the patient on the purpose and policies of crisis stabilization and provide a format to answer questions about their admission.  The group details unit policies and expectations of patients while admitted. Did not attend.  

## 2013-01-11 NOTE — Progress Notes (Signed)
Pt. Was in her bed when writer arrived.  Writer was successful in getting patient out in the milieu for a brief period but pt. Was back in bed during group time.  Pt. States that she is discharging tomorrow and going to [facility].  States she is ready for the discharge.  Pt. Denies SI and HI, denies A and VH.  No complaints of pain or discomfort noted.  Pt. Would not take her HS medications.  Writer had ask pt. To come to the med window and pt. Said okay.  Pt. Never came so writer took the meds to the patients room.  The pt. Kathleen [name] when Clinical research associate called her name but would not speak again when writer told her she had her HS medications.  Pt. Would not raise up in the bed to take the medications.

## 2013-01-11 NOTE — Progress Notes (Signed)
Inpatient Diabetes Program Recommendations  AACE/ADA: New Consensus Statement on Inpatient Glycemic Control (2013)  Target Ranges:  Prepandial:   less than 140 mg/dL      Peak postprandial:   less than 180 mg/dL (1-2 hours)      Critically ill patients:  140 - 180 mg/dL   Reason for Assessment:  Hyperglycemia  Results for RANELLE, AUKER (MRN [id number]) as of 01/11/2013 16:03  Ref. Range 01/10/2013 11:24 01/10/2013 16:47 01/10/2013 21:15 01/11/2013 06:10 01/11/2013 11:18  Glucose-Capillary Latest Range: 70-99 mg/dL 409 (H) 811 (H) 914 (H) 106 (H) 346 (H)   Post-prandial hyperglycemia  Recommendations: Increase meal coverage insulin to Novolog 6 units tidwc.  Will continue to follow. Thank you. Ailene Ards, RD, LDN, CDE Inpatient Diabetes Coordinator [phone]

## 2013-01-11 NOTE — [facility] Suicide Risk Assessment (Signed)
[facility] INPATIENT:  Family/Significant Other Suicide Prevention Education  Suicide Prevention Education:  Education Completed: [name] has been identified by the patient as the family member/significant other with whom the patient will be residing, and identified as the person(s) who will aid the patient in the event of a mental health crisis (suicidal ideations/suicide attempt).  With written consent from the patient, the family member/significant other has been provided the following suicide prevention education, prior to the and/or following the discharge of the patient.  The suicide prevention education provided includes the following:  Suicide risk factors  Suicide prevention and interventions  National Suicide Hotline telephone number  [facility] assessment telephone number  [facility] Emergency Assistance 911  [facility] and/or Residential Mobile Crisis Unit telephone number  Request made of family/significant other to:  Remove weapons (e.g., guns, rifles, knives), all items previously/currently identified as safety concern.    Remove drugs/medications (over-the-counter, prescriptions, illicit drugs), all items previously/currently identified as a safety concern.  The family member/significant other verbalizes understanding of the suicide prevention education information provided.  The family member/significant other agrees to remove the items of safety concern listed above.  Kathleen [name], Kathleen Kathleen [name] 01/11/2013, 10:44 AM

## 2013-01-11 NOTE — [facility] Group Notes (Signed)
[facility] LCSW Group Therapy  01/11/2013 1:36 PM  Type of Therapy:  Group Therapy  Participation Level:  Did Not Attend   Smart, Kionna Brier 01/11/2013, 1:36 PM

## 2013-01-11 NOTE — Progress Notes (Signed)
Adult Psychoeducational Group Note  Date:  01/11/2013 Time:  11:00AM Group Topic/Focus:  Recovery Goals:   The focus of this group is to identify appropriate goals for recovery and establish a plan to achieve them.  Participation Level:  Did Not Attend   Additional Comments: Pt. Didn't attend group.   Bing Plume D 01/11/2013, 12:02 PM

## 2013-01-11 NOTE — Progress Notes (Signed)
Patient ID: Kathleen [name], female   DOB: 02-21-1966, 47 y.o.   MRN: [id number] D- Patient got up this am to have her cbg checked but was tired so laid back down.  Patient then slept soundly until 10:30 am.  Patient roused when called several times, but was sedated.  Meds held until patient got up and was alert. MD informed.   A- Talked with patient about making healthy food choices. R-Patient continues to eat may carbohydrates and sugars at meals and snacktime

## 2013-01-11 NOTE — Progress Notes (Signed)
Recreation Therapy Notes  Date: 09.09.2014 Time: 3:00pm Location: [address] Dayroom   Group Topic: Physiological scientist Response: Attentive, Appropriate, Engaged   Affect: Euthymic  Clinical Observations/Feedback: Dog Team: [name]. Patient interacted appropriately with peer, dog team, LRT and MHT.   Marykay Lex Sakshi Sermons, LRT/CTRS  Jearl Klinefelter 01/11/2013 4:54 PM

## 2013-01-12 DIAGNOSIS — F259 Schizoaffective disorder, unspecified: Principal | ICD-10-CM

## 2013-01-12 DIAGNOSIS — F4310 Post-traumatic stress disorder, unspecified: Secondary | ICD-10-CM

## 2013-01-12 DIAGNOSIS — F1910 Other psychoactive substance abuse, uncomplicated: Secondary | ICD-10-CM

## 2013-01-12 DIAGNOSIS — F1994 Other psychoactive substance use, unspecified with psychoactive substance-induced mood disorder: Secondary | ICD-10-CM

## 2013-01-12 LAB — GLUCOSE, CAPILLARY: Glucose-Capillary: 291 mg/dL — ABNORMAL HIGH (ref 70–99)

## 2013-01-12 LAB — VALPROIC ACID LEVEL: Valproic Acid Lvl: 47.2 ug/mL — ABNORMAL LOW (ref 50.0–100.0)

## 2013-01-12 MED ORDER — DIVALPROEX SODIUM ER 500 MG PO TB24: 500.0000 mg | ORAL_TABLET | Freq: Two times a day (BID) | ORAL | Status: DC

## 2013-01-12 MED ORDER — CHLORPROMAZINE HCL 50 MG PO TABS: 50.0000 mg | ORAL_TABLET | Freq: Three times a day (TID) | ORAL | Status: DC

## 2013-01-12 MED ORDER — QUETIAPINE FUMARATE 400 MG PO TABS: 400.0000 mg | ORAL_TABLET | Freq: Two times a day (BID) | ORAL | Status: DC

## 2013-01-12 NOTE — Progress Notes (Signed)
Agree with assessment and plan Adams Hinch A. Abir Eroh, M.D. 

## 2013-01-12 NOTE — [facility] Suicide Risk Assessment (Signed)
Suicide Risk Assessment  Discharge Assessment     Demographic Factors:  NA  Mental Status Per Nursing Assessment::   On Admission:  Suicidal ideation indicated by patient  Current Mental Status by Physician: In full contact with reality. There are no suicidal ideas, plans or intent. Her mood is euthymic, her affect is appropriate. She is committed to abstinence. She feels the medications are working well for her and plans to pursue them.   Loss Factors: NA  Historical Factors: NA  Risk Reduction Factors:   Sense of responsibility to family, Living with another person, especially a relative and Positive social support  Continued Clinical Symptoms:  Depression:   Comorbid alcohol abuse/dependence Alcohol/Substance Abuse/Dependencies  Cognitive Features That Contribute To Risk:  Closed-mindedness Polarized thinking Thought constriction (tunnel vision)    Suicide Risk:  Minimal: No identifiable suicidal ideation.  Patients presenting with no risk factors but with morbid ruminations; may be classified as minimal risk based on the severity of the depressive symptoms  Discharge Diagnoses:   AXIS I:  Cocaine Dependence, Mood Disorder  NOS, PTSD AXIS II:  Deferred AXIS III:   Past Medical History  Diagnosis Date  . Anxiety   . Post traumatic stress disorder   . Schizoaffective disorder, bipolar type   . Asthma   . Headache(784.0)   . Cocaine-induced mental disorder     psychosis  . 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 Seizures 01/03/2013    Most recent 12/31/2012, per pt report.  Marland Kitchen COPD (chronic obstructive pulmonary disease)    AXIS IV:  other psychosocial or environmental problems AXIS V:  61-70 mild symptoms  Plan Of Care/Follow-up recommendations:  Activity:  as tolerated Diet:  regular Follow up [facility]hell Heinrich) Is patient on multiple antipsychotic therapies at discharge:  No   Has Patient had three or more failed trials of antipsychotic  monotherapy by history:  No  Recommended Plan for Multiple Antipsychotic Therapies: NA  Suzette Flagler A 01/12/2013, 12:22 PM

## 2013-01-12 NOTE — Progress Notes (Signed)
Patient discharged at this time. All belongings returned to patient at time of discharge. Discharge instructions and prescriptions explained, copies given and patient verbalized understanding. Patient A/Ox4, denies SI/HI and voiced no concerns at time of discharge. Bright affect , pleasant mood, walked to lobby at time of discharge. Boyfriend here to pick pt up.

## 2013-01-12 NOTE — Progress Notes (Signed)
D: Observed visible in the milieu, appeared sleepy most of the time but voiced not physical complaints. Attended groups, some interaction with peers noted. Anxious for discharge today. A/O x4, denies SI/HI. Blunted affect, cooperative and pleasant, making jokes with staff. A: Support and encouragement given, meds given as ordered. Maintained Q15 min checks for safety. R: Receptive, awaiting ride for discharge at this time.

## 2013-01-12 NOTE — Progress Notes (Signed)
[facility] Adult Case Management Discharge Plan :  Will you be returning to the same living situation after discharge: Yes,  living with bf until admission into [facility]. At discharge, do you have transportation home?:Yes,  boyfriend Do you have the ability to pay for your medications:Yes,  mental health  Release of information consent forms completed and in the chart;  Patient's signature needed at discharge.  Patient to Follow up at: Follow-up Information   Follow up with [facility]  On 01/24/2013. (Arrive by 8AM with 30 day medication supply, ID, and clothing. Contact person: Bennie Hind )    Contact information:   [address] [place], [place] [address] Phone: [phone] Fax: [phone]      Follow up with [facility] . (Walk in between 8AM-9AM Monday through Friday for hospital follow up/medication management.)    Contact information:   [address], [place] [address] Phone: [phone] Fax: [phone]      Patient denies SI/HI:   Yes,  during group/self report    Safety Planning and Suicide Prevention discussed:  Yes,  SPE completed with pt and her boyfriend. SPI pamphlet given to pt and she was encouraged to share with her support system.   Smart, Webber Michiels 01/12/2013, 10:39 AM

## 2013-01-12 NOTE — Progress Notes (Signed)
Recreation Therapy Notes  Date: 09.09.2014 Time: 3:00pm Location: [address] Dayroom  Group Topic: Communication, Team Building, Problem Solving  Goal Area(s) Addresses:  Patient will effectively work with peer towards shared goal.  Patient will identify skill used to make activity successful.  Patient will identify how skills used during activity can be used to reach post d/c goals.   Behavioral Response: Did not attend  Jearl Klinefelter, LRT/CTRS  Kinzlee Selvy L 01/12/2013 8:21 AM

## 2013-01-12 NOTE — BHH Group Notes (Signed)
[facility] LCSW Aftercare Discharge Planning Group Note   01/12/2013 9:28 AM  Participation Quality:  Appropriate   Mood/Affect:  Appropriate  Depression Rating:  1  Anxiety Rating:  0  Thoughts of Suicide:  No Will you contract for safety?   NA  Current AVH:  No  Plan for Discharge/Comments:  Pt stated that she is not experiencing withdrawals today. She is pleasant with calm affect. Pt stated that she is excited to d/c today. [facility] admission date of 9-22. followup at [facility] for med management.   Transportation Means: boyfriend   Supports: boyfriend   Counselling psychologist, Max
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
## 2013-01-12 NOTE — Progress Notes (Signed)
Adult Psychoeducational Group Note  Date:  01/12/2013 Time:  11:00AM Group Topic/Focus:  Personal Choices and Values:   The focus of this group is to help patients assess and explore the importance of values in their lives, how their values affect their decisions, how they express their values and what opposes their expression.  Participation Level:  Active  Participation Quality:  Appropriate and Attentive  Affect:  Appropriate  Cognitive:  Alert and Appropriate  Insight: Appropriate  Engagement in Group:  Engaged  Modes of Intervention:  Discussion  Additional Comments:  Pt. Was appropriate and attentive during today's group discussion. Pt was able to complete values and feeling handout. Pt shared that she value being respected and family. Pt shared that she need to work on having a better support group and attending meeting.   Bing Plume D 01/12/2013, 1:22 PM

## 2013-01-17 NOTE — Progress Notes (Signed)
Patient Discharge Instructions:  After Visit Summary (AVS):   Faxed to:  01/17/13 Discharge Summary Note:   Faxed to:  01/17/13 Psychiatric Admission Assessment Note:   Faxed to:  01/17/13 Suicide Risk Assessment - Discharge Assessment:   Faxed to:  01/17/13 Faxed/Sent to the Next Level Care provider:  01/17/13 Faxed to [facility] @ [phone] Faxed to [facility] @ [phone]  Jerelene Redden, 01/17/2013, 2:24 PM

## 2013-01-24 ENCOUNTER — Emergency Department (HOSPITAL_COMMUNITY)
Admission: EM | Admit: 2013-01-24 | Discharge: 2013-01-24 | Disposition: A | Discharge status: Other Acute Inpatient Hospital | Payer: MEDICAID | Source: Home / Self Care

## 2013-01-24 ENCOUNTER — Inpatient Hospital Stay (HOSPITAL_COMMUNITY)
Admission: AD | Admit: 2013-01-24 | Discharge: 2013-01-28 | DRG: 885 | Disposition: A | Discharge status: Home / Self Care | Payer: MEDICAID | Source: Intra-hospital | Attending: Psychiatry | Admitting: Psychiatry

## 2013-01-24 ENCOUNTER — Encounter (HOSPITAL_COMMUNITY): Payer: Self-pay

## 2013-01-24 DIAGNOSIS — F17200 Nicotine dependence, unspecified, uncomplicated: Secondary | ICD-10-CM | POA: Insufficient documentation

## 2013-01-24 DIAGNOSIS — Z794 Long term (current) use of insulin: Secondary | ICD-10-CM | POA: Insufficient documentation

## 2013-01-24 DIAGNOSIS — F259 Schizoaffective disorder, unspecified: Principal | ICD-10-CM | POA: Diagnosis present

## 2013-01-24 DIAGNOSIS — R45851 Suicidal ideations: Secondary | ICD-10-CM

## 2013-01-24 DIAGNOSIS — Z79899 Other long term (current) drug therapy: Secondary | ICD-10-CM

## 2013-01-24 DIAGNOSIS — F4310 Post-traumatic stress disorder, unspecified: Secondary | ICD-10-CM

## 2013-01-24 DIAGNOSIS — F14159 Cocaine abuse with cocaine-induced psychotic disorder, unspecified: Secondary | ICD-10-CM

## 2013-01-24 DIAGNOSIS — F3111 Bipolar disorder, current episode manic without psychotic features, mild: Secondary | ICD-10-CM

## 2013-01-24 DIAGNOSIS — J4489 Other specified chronic obstructive pulmonary disease: Secondary | ICD-10-CM | POA: Insufficient documentation

## 2013-01-24 DIAGNOSIS — E119 Type 2 diabetes mellitus without complications: Secondary | ICD-10-CM | POA: Insufficient documentation

## 2013-01-24 DIAGNOSIS — F319 Bipolar disorder, unspecified: Secondary | ICD-10-CM | POA: Insufficient documentation

## 2013-01-24 DIAGNOSIS — F39 Unspecified mood [affective] disorder: Secondary | ICD-10-CM

## 2013-01-24 DIAGNOSIS — F1410 Cocaine abuse, uncomplicated: Secondary | ICD-10-CM

## 2013-01-24 DIAGNOSIS — F411 Generalized anxiety disorder: Secondary | ICD-10-CM | POA: Diagnosis present

## 2013-01-24 DIAGNOSIS — J449 Chronic obstructive pulmonary disease, unspecified: Secondary | ICD-10-CM | POA: Insufficient documentation

## 2013-01-24 DIAGNOSIS — J45909 Unspecified asthma, uncomplicated: Secondary | ICD-10-CM | POA: Diagnosis present

## 2013-01-24 DIAGNOSIS — Z5900 Homelessness unspecified: Secondary | ICD-10-CM

## 2013-01-24 DIAGNOSIS — E1165 Type 2 diabetes mellitus with hyperglycemia: Secondary | ICD-10-CM

## 2013-01-24 DIAGNOSIS — Z3202 Encounter for pregnancy test, result negative: Secondary | ICD-10-CM | POA: Insufficient documentation

## 2013-01-24 DIAGNOSIS — G40909 Epilepsy, unspecified, not intractable, without status epilepticus: Secondary | ICD-10-CM | POA: Insufficient documentation

## 2013-01-24 DIAGNOSIS — F1420 Cocaine dependence, uncomplicated: Secondary | ICD-10-CM

## 2013-01-24 DIAGNOSIS — IMO0002 Reserved for concepts with insufficient information to code with codable children: Secondary | ICD-10-CM

## 2013-01-24 DIAGNOSIS — J209 Acute bronchitis, unspecified: Secondary | ICD-10-CM

## 2013-01-24 LAB — COMPREHENSIVE METABOLIC PANEL
AST: 31 U/L (ref 0–37)
Albumin: 4 g/dL (ref 3.5–5.2)
Alkaline Phosphatase: 64 U/L (ref 39–117)
Chloride: 98 mEq/L (ref 96–112)
Potassium: 3.5 mEq/L (ref 3.5–5.1)
Sodium: 133 mEq/L — ABNORMAL LOW (ref 135–145)
Total Bilirubin: 0.2 mg/dL — ABNORMAL LOW (ref 0.3–1.2)

## 2013-01-24 LAB — CBC
Platelets: 322 10*3/uL (ref 150–400)
RDW: 16.5 % — ABNORMAL HIGH (ref 11.5–15.5)
WBC: 8.6 10*3/uL (ref 4.0–10.5)

## 2013-01-24 LAB — GLUCOSE, CAPILLARY
Glucose-Capillary: 135 mg/dL — ABNORMAL HIGH (ref 70–99)
Glucose-Capillary: 94 mg/dL (ref 70–99)

## 2013-01-24 LAB — RAPID URINE DRUG SCREEN, HOSP PERFORMED
Amphetamines: NOT DETECTED
Barbiturates: NOT DETECTED
Benzodiazepines: NOT DETECTED

## 2013-01-24 LAB — SALICYLATE LEVEL: Salicylate Lvl: <2 mg/dL — ABNORMAL LOW (ref 2.8–20.0)

## 2013-01-24 LAB — ACETAMINOPHEN LEVEL: Acetaminophen (Tylenol), Serum: <15 ug/mL (ref 10–30)

## 2013-01-24 LAB — POCT PREGNANCY, URINE: Preg Test, Ur: NEGATIVE

## 2013-01-24 MED ORDER — TRAZODONE HCL 50 MG PO TABS
50.0000 mg | ORAL_TABLET | Freq: Every evening | ORAL | Status: DC | PRN
  Filled 2013-01-24: qty 1

## 2013-01-24 MED ORDER — INSULIN ASPART 100 UNIT/ML ~~LOC~~ SOLN: 2.0000 [IU] | Freq: Three times a day (TID) | SUBCUTANEOUS | Status: DC

## 2013-01-24 MED ORDER — CHLORPROMAZINE HCL 25 MG PO TABS
50.0000 mg | ORAL_TABLET | Freq: Three times a day (TID) | ORAL | Status: DC
  Administered 2013-01-24 (×2): 50 mg via ORAL
  Filled 2013-01-24 (×2): qty 2

## 2013-01-24 MED ORDER — INSULIN ASPART 100 UNIT/ML ~~LOC~~ SOLN
4.0000 [IU] | Freq: Three times a day (TID) | SUBCUTANEOUS | Status: DC
  Administered 2013-01-24 (×2): 4 [IU] via SUBCUTANEOUS
  Filled 2013-01-24 (×2): qty 1

## 2013-01-24 MED ORDER — DIVALPROEX SODIUM ER 500 MG PO TB24
500.0000 mg | ORAL_TABLET | Freq: Two times a day (BID) | ORAL | Status: DC
  Administered 2013-01-24: 500 mg via ORAL
  Filled 2013-01-24 (×2): qty 1

## 2013-01-24 MED ORDER — ACETAMINOPHEN 325 MG PO TABS: 650.0000 mg | ORAL_TABLET | ORAL | Status: DC | PRN

## 2013-01-24 MED ORDER — INSULIN ASPART 100 UNIT/ML ~~LOC~~ SOLN
0.0000 [IU] | Freq: Three times a day (TID) | SUBCUTANEOUS | Status: DC
  Administered 2013-01-26: 3 [IU] via SUBCUTANEOUS

## 2013-01-24 MED ORDER — ACETAMINOPHEN 325 MG PO TABS: 650.0000 mg | ORAL_TABLET | Freq: Four times a day (QID) | ORAL | Status: DC | PRN

## 2013-01-24 MED ORDER — ALBUTEROL SULFATE HFA 108 (90 BASE) MCG/ACT IN AERS: 2.0000 | INHALATION_SPRAY | Freq: Four times a day (QID) | RESPIRATORY_TRACT | Status: DC | PRN

## 2013-01-24 MED ORDER — QUETIAPINE FUMARATE 300 MG PO TABS
400.0000 mg | ORAL_TABLET | Freq: Two times a day (BID) | ORAL | Status: DC
  Administered 2013-01-24: 400 mg via ORAL
  Filled 2013-01-24 (×2): qty 1

## 2013-01-24 MED ORDER — ALUM & MAG HYDROXIDE-SIMETH 200-200-20 MG/5ML PO SUSP: 30.0000 mL | ORAL | Status: DC | PRN

## 2013-01-24 MED ORDER — LORAZEPAM 1 MG PO TABS: 1.0000 mg | ORAL_TABLET | Freq: Three times a day (TID) | ORAL | Status: DC | PRN

## 2013-01-24 MED ORDER — INSULIN ASPART 100 UNIT/ML ~~LOC~~ SOLN
2.0000 [IU] | Freq: Three times a day (TID) | SUBCUTANEOUS | Status: DC
  Administered 2013-01-25 – 2013-01-28 (×9): 2 [IU] via SUBCUTANEOUS

## 2013-01-24 MED ORDER — INSULIN ASPART 100 UNIT/ML ~~LOC~~ SOLN: 0.0000 [IU] | Freq: Three times a day (TID) | SUBCUTANEOUS | Status: DC

## 2013-01-24 MED ORDER — ALBUTEROL SULFATE HFA 108 (90 BASE) MCG/ACT IN AERS
2.0000 | INHALATION_SPRAY | Freq: Four times a day (QID) | RESPIRATORY_TRACT | Status: DC | PRN
  Administered 2013-01-28: 2 via RESPIRATORY_TRACT
  Filled 2013-01-24: qty 6.7

## 2013-01-24 MED ORDER — NICOTINE 21 MG/24HR TD PT24
21.0000 mg | MEDICATED_PATCH | Freq: Every day | TRANSDERMAL | Status: DC
  Administered 2013-01-24: 21 mg via TRANSDERMAL
  Filled 2013-01-24: qty 1

## 2013-01-24 MED ORDER — MAGNESIUM HYDROXIDE 400 MG/5ML PO SUSP: 30.0000 mL | Freq: Every day | ORAL | Status: DC | PRN

## 2013-01-24 NOTE — BH Assessment (Signed)
Assessment Note  Kathleen [name] is an 47 y.o. female presenting to ED with SI. CSW and training CSW met with Pt. for assessment. Pt reports suicidal ideation with plan to overdose on psych meds. Pt reports that she was not accepted for residential tx at [facility], and that this subsequently led to her feeling depressed and suicidal. Pt states she was turned away due to her Medicaid being in [facility]. Pt. Hears voices talking to her and she talks to them. Pt hears voiced command her to hurt her aunt. Pt also reports desire to harm her aunt. Pt reports history of cocaine use and states that she has not used cocaine since leaving [facility] on 01/12/13.  Pt. Drowsy during assessment, and she reports she has not slept. Pt compliant with meds when she remembers them, but she is unsure of how many doses she has missed. Pt lives with mother, and feels like it is a hostile environment. Pt feels that if she were able to get into a rehab program she would not be suicidal. CSW referred to psychiatric provider for further evaluation.   Axis I: Schizoaffective disorder, bipolar type, cocaine abuse Axis II: Deferred Axis III:  Past Medical History  Diagnosis Date  . Anxiety   . Post traumatic stress disorder   . Schizoaffective disorder, bipolar type   . Asthma   . Headache(784.0)   . Cocaine-induced mental disorder     psychosis  . 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 Seizures 01/03/2013    Most recent 12/31/2012, per pt report.  Marland Kitchen COPD (chronic obstructive pulmonary disease)    Axis IV: housing problems, other psychosocial or environmental problems, problems related to social environment, problems with access to health care services and problems with primary support group Axis V: 11-20 some danger of hurting self or others possible OR occasionally fails to maintain minimal personal hygiene OR gross impairment in communication  Past Medical History:  Past Medical History  Diagnosis Date   . Anxiety   . Post traumatic stress disorder   . Schizoaffective disorder, bipolar type   . Asthma   . Headache(784.0)   . Cocaine-induced mental disorder     psychosis  . 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 Seizures 01/03/2013    Most recent 12/31/2012, per pt report.  Marland Kitchen COPD (chronic obstructive pulmonary disease)     History reviewed. No pertinent past surgical history.  Family History:  Family History  Problem Relation Age of Onset  . Diabetes Mother   . Cancer Mother     Social History:  reports that she has been smoking Cigarettes.  She has a 30 pack-year smoking history. She has never used smokeless tobacco. She reports that she uses illicit drugs ("Crack" cocaine and Cocaine). She reports that she does not drink alcohol.  Additional Social History:     CIWA: CIWA-Ar BP: 135/89 mmHg Pulse Rate: 93 COWS:    Allergies:  Allergies  Allergen Reactions  . Peach Flavor Rash    Home Medications:  (Not in a hospital admission)  OB/GYN Status:  Patient's last menstrual period was 12/27/2012.  General Assessment Data Location of Assessment: [facility] ED Is this a Tele or Face-to-Face Assessment?: Face-to-Face Is this an Initial Assessment or a Re-assessment for this encounter?: Initial Assessment Living Arrangements: Parent Can pt return to current living arrangement?: Yes Admission Status: Voluntary Is patient capable of signing voluntary admission?: Yes Transfer from: Home Referral Source: Self/Family/Friend  [facility] Crisis Care Plan Living Arrangements: Parent Name of Psychiatrist: Vesta Mixer Name of Therapist: [facility]  Education Status Is patient currently in school?: No Highest grade of school patient has completed: 8th  Risk to self Suicidal Ideation: Yes-Currently Present Suicidal Intent: Yes-Currently Present Suicidal Plan?: Yes-Currently Present Specify Current Suicidal Plan: overdose on psychmeds (will overdose on psych meds) Access to  Means: Yes Specify Access to Suicidal Means: pills in her possession What has been your use of drugs/alcohol within the last 12 months?: cocaine Previous Attempts/Gestures: Yes How many times?: 1 Triggers for Past Attempts: Unpredictable Recent stressful life event(s): Conflict (Comment);Financial Problems Persecutory voices/beliefs?: No Depression: Yes Depression Symptoms: Despondent;Insomnia;Tearfulness;Isolating;Feeling worthless/self pity Substance abuse history and/or treatment for substance abuse?: Yes  Risk to Others Homicidal Ideation: Yes-Currently Present Thoughts of Harm to Others: Yes-Currently Present Comment - Thoughts of Harm to Others: plan to hurt her aunt Current Homicidal Intent: No Current Homicidal Plan: No Describe Current Homicidal Plan: n/a Access to Homicidal Means: No Describe Access to Homicidal Means: n/a Identified Victim: Aunt History of harm to others?: No Assessment of Violence: None Noted Violent Behavior Description: n/a Does patient have access to weapons?: No Criminal Charges Pending?: No Does patient have a court date: No  Psychosis Hallucinations: Auditory;With command Delusions: None noted  Mental Status Report Appear/Hygiene: Other (Comment) (calm and casual) Eye Contact: Poor Motor Activity: Freedom of movement Speech: Logical/coherent Level of Consciousness: Drowsy Mood: Depressed Affect: Appropriate to circumstance Anxiety Level: None Panic attack frequency: n/a Most recent panic attack: n/a Thought Processes: Coherent Judgement: Unimpaired Orientation: Person;Place;Time;Situation Obsessive Compulsive Thoughts/Behaviors: None  Cognitive Functioning Concentration: Normal Memory: Recent Intact IQ: Average Insight: Fair Impulse Control: Fair Appetite: Fair Sleep: Decreased Total Hours of Sleep:  (0) Vegetative Symptoms: None  ADLScreening [facility] Assessment Services) Patient's cognitive ability adequate to safely  complete daily activities?: Yes Patient able to express need for assistance with ADLs?: Yes Independently performs ADLs?: Yes (appropriate for developmental age)  Prior Inpatient Therapy Prior Inpatient Therapy: Yes Prior Therapy Dates: 2014 Prior Therapy Facilty/Provider(s): [facility] Reason for Treatment: schizophrenia, bipolar, SA  Prior Outpatient Therapy Prior Outpatient Therapy: Yes Prior Therapy Dates: ongoing Prior Therapy Facilty/Provider(s): [facility] Reason for Treatment: schizophrenia, bipolar, SA  ADL Screening (condition at time of admission) Patient's cognitive ability adequate to safely complete daily activities?: Yes Patient able to express need for assistance with ADLs?: Yes Independently performs ADLs?: Yes (appropriate for developmental age)                  Additional Information 1:1 In Past 12 Months?: No CIRT Risk: No Elopement Risk: No Does patient have medical clearance?: Yes     Disposition:  Disposition Initial Assessment Completed for this Encounter: Yes Disposition of Patient: Referred to Other disposition(s): To current provider  On Site Evaluation by:   Reviewed with Physician:    Melodie Bouillon 01/24/2013 12:32 PM

## 2013-01-24 NOTE — ED Notes (Signed)
Pt belongings in locker 28 

## 2013-01-24 NOTE — ED Notes (Addendum)
Pt. Has 2 belonging bags one with a purse, notebook, a pair of pants, shirt, shoes and under clothing. Pt. Belonging bags are in locker 28.

## 2013-01-24 NOTE — ED Provider Notes (Signed)
CSN: [id number]     Arrival date & time 01/24/13  7829 History   First MD Initiated Contact with Patient 01/24/13 1003     Chief Complaint  Patient presents with  . Medical Clearance   (Consider location/radiation/quality/duration/timing/severity/associated sxs/prior Treatment) The history is provided by the patient and a friend.  pt with hx bipolar disorder, anxiety, ptsd, substance abuse, presents indicating is feeling depressed with thoughts of suicide.  Pt denies specific inciting event or specific stressor, stating 'its everything'.   Denies specific plan or any attempt. States was just d/c from [facility] for same 1 week ago. States was supposed to have d/c plan of going to [facility], but when pt went to [facility] was told a problem w '[facility]' paperwork, and was told could not stay there.  Pt then went to [facility], who sent to ED.  Pt states compliant w normal meds. Continues to abuse cocaine. States otherwise her physical health has been at baseline.       Past Medical History  Diagnosis Date  . Anxiety   . Post traumatic stress disorder   . Schizoaffective disorder, bipolar type   . Asthma   . Headache(784.0)   . Cocaine-induced mental disorder     psychosis  . 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 Seizures 01/03/2013    Most recent 12/31/2012, per pt report.  Marland Kitchen COPD (chronic obstructive pulmonary disease)    History reviewed. No pertinent past surgical history. Family History  Problem Relation Age of Onset  . Diabetes Mother   . Cancer Mother    History  Substance Use Topics  . Smoking status: Current Some Day Smoker -- 1.00 packs/day for 30 years    Types: Cigarettes  . Smokeless tobacco: Never Used  . Alcohol Use: No     Comment: Pt denies   OB History   Grav Para Term Preterm Abortions TAB SAB Ect Mult Living   1 1 1       1      Review of Systems  Constitutional: Negative for fever and chills.  HENT: Negative for neck pain.   Eyes: Negative  for redness.  Respiratory: Negative for shortness of breath.   Cardiovascular: Negative for chest pain.  Gastrointestinal: Negative for abdominal pain.  Genitourinary: Negative for flank pain.  Musculoskeletal: Negative for back pain.  Skin: Negative for rash.  Neurological: Negative for headaches.  Hematological: Does not bruise/bleed easily.  Psychiatric/Behavioral: Positive for suicidal ideas and dysphoric mood.    Allergies  Peach flavor  Home Medications   Current Outpatient Rx  Name  Route  Sig  Dispense  Refill  . albuterol (PROVENTIL HFA;VENTOLIN HFA) 108 (90 BASE) MCG/ACT inhaler   Inhalation   Inhale 2 puffs into the lungs every 6 (six) hours as needed. shortness of breath         . chlorproMAZINE (THORAZINE) 50 MG tablet   Oral   Take 1 tablet (50 mg total) by mouth 3 (three) times daily.   90 tablet   0   . divalproex (DEPAKOTE ER) 500 MG 24 hr tablet   Oral   Take 1 tablet (500 mg total) by mouth 2 (two) times daily.   60 tablet   0   . insulin aspart (NOVOLOG) 100 UNIT/ML injection   Subcutaneous   Inject 2 Units into the skin 3 (three) times daily before meals. Sliding scale   1 vial   12   . insulin aspart (NOVOLOG) 100  UNIT/ML injection   Subcutaneous   Inject 0-15 Units into the skin 3 (three) times daily with meals.   1 vial   12   . QUEtiapine (SEROQUEL) 400 MG tablet   Oral   Take 1 tablet (400 mg total) by mouth 2 (two) times daily.   60 tablet   0    BP 135/89  Pulse 93  Temp(Src) 97.7 F (36.5 C) (Oral)  Resp 16  SpO2 96%  LMP 12/27/2012 Physical Exam  Nursing note and vitals reviewed. Constitutional: She is oriented to person, place, and time. She appears well-developed and well-nourished. No distress.  HENT:  Head: Atraumatic.  Mouth/Throat: Oropharynx is clear and moist.  Eyes: Conjunctivae are normal. No scleral icterus.  Neck: Neck supple. No tracheal deviation present.  Cardiovascular: Normal rate.    Pulmonary/Chest: Effort normal. No respiratory distress.  Abdominal: Normal appearance. She exhibits no distension.  Musculoskeletal: She exhibits no edema.  Neurological: She is alert and oriented to person, place, and time.  Steady gait.   Skin: Skin is warm and dry. No rash noted.  Psychiatric: She has a normal mood and affect.  Depressed mood, tearful.  + SI.     ED Course  Procedures (including critical care time)  Results for orders placed during the hospital encounter of 01/24/13  ACETAMINOPHEN LEVEL      Result Value Range   Acetaminophen (Tylenol), Serum <15.0  10 - 30 ug/mL  CBC      Result Value Range   WBC 8.6  4.0 - 10.5 K/uL   RBC 5.29 (*) 3.87 - 5.11 MIL/uL   Hemoglobin 13.0  12.0 - 15.0 g/dL   HCT 16.1  09.6 - 04.5 %   MCV 69.9 (*) 78.0 - 100.0 fL   MCH 24.6 (*) 26.0 - 34.0 pg   MCHC 35.1  30.0 - 36.0 g/dL   RDW 40.9 (*) 81.1 - 91.4 %   Platelets 322  150 - 400 K/uL  COMPREHENSIVE METABOLIC PANEL      Result Value Range   Sodium 133 (*) 135 - 145 mEq/L   Potassium 3.5  3.5 - 5.1 mEq/L   Chloride 98  96 - 112 mEq/L   CO2 21  19 - 32 mEq/L   Glucose, Bld 116 (*) 70 - 99 mg/dL   BUN 19  6 - 23 mg/dL   Creatinine, Ser 7.82  0.50 - 1.10 mg/dL   Calcium 9.7  8.4 - 95.6 mg/dL   Total Protein 8.1  6.0 - 8.3 g/dL   Albumin 4.0  3.5 - 5.2 g/dL   AST 31  0 - 37 U/L   ALT 65 (*) 0 - 35 U/L   Alkaline Phosphatase 64  39 - 117 U/L   Total Bilirubin 0.2 (*) 0.3 - 1.2 mg/dL   GFR calc non Af Amer 87 (*) >90 mL/min   GFR calc Af Amer >90  >90 mL/min  ETHANOL      Result Value Range   Alcohol, Ethyl (B) <11  0 - 11 mg/dL  SALICYLATE LEVEL      Result Value Range   Salicylate Lvl <2.0 (*) 2.8 - 20.0 mg/dL  URINE RAPID DRUG SCREEN (HOSP PERFORMED)      Result Value Range   Opiates NONE DETECTED  NONE DETECTED   Cocaine POSITIVE (*) NONE DETECTED   Benzodiazepines NONE DETECTED  NONE DETECTED   Amphetamines NONE DETECTED  NONE DETECTED   Tetrahydrocannabinol  NONE DETECTED  NONE DETECTED  Barbiturates NONE DETECTED  NONE DETECTED  GLUCOSE, CAPILLARY      Result Value Range   Glucose-Capillary 108 (*) 70 - 99 mg/dL  POCT PREGNANCY, URINE      Result Value Range   Preg Test, Ur NEGATIVE  NEGATIVE   Dg Chest 2 View  01/04/2013   *RADIOLOGY REPORT*  Clinical Data: Fever  CHEST - 2 VIEW  Comparison: 07/03/2011  Findings: Hypoventilation with bibasilar atelectasis.  Negative for heart failure or effusion.  Heart size is normal.  IMPRESSION: Moderate bibasilar atelectasis.   Original Report Authenticated By: Janeece Riggers, M.D.     MDM  Labs.  Discussed w psych team - they will assess.  Pt moved to psych ED pending psych team evaluation and disposition.  Reviewed nursing notes and prior charts for additional history.    Recheck pt alert, content. dispo per psych team.     Suzi Roots, MD 01/24/13 [phone]

## 2013-01-24 NOTE — ED Notes (Signed)
Pt states took her morning meds already

## 2013-01-24 NOTE — ED Notes (Signed)
Pt c/o feeling suicidal states through [facility] they made an appt with [facility] for this am and they turned her away. Pt very tearful, states she is ready to give up. Pt states she is a recovering addict, bipolar and schizo and needs help.

## 2013-01-25 ENCOUNTER — Encounter (HOSPITAL_COMMUNITY): Payer: Self-pay | Admitting: *Deleted

## 2013-01-25 DIAGNOSIS — F411 Generalized anxiety disorder: Secondary | ICD-10-CM

## 2013-01-25 DIAGNOSIS — F1994 Other psychoactive substance use, unspecified with psychoactive substance-induced mood disorder: Secondary | ICD-10-CM

## 2013-01-25 DIAGNOSIS — F3130 Bipolar disorder, current episode depressed, mild or moderate severity, unspecified: Secondary | ICD-10-CM

## 2013-01-25 LAB — GLUCOSE, CAPILLARY
Glucose-Capillary: 93 mg/dL (ref 70–99)
Glucose-Capillary: 114 mg/dL — ABNORMAL HIGH (ref 70–99)

## 2013-01-25 MED ORDER — QUETIAPINE FUMARATE 200 MG PO TABS
200.0000 mg | ORAL_TABLET | Freq: Two times a day (BID) | ORAL | Status: DC
  Administered 2013-01-25 – 2013-01-27 (×5): 200 mg via ORAL
  Filled 2013-01-25: qty 1
  Filled 2013-01-25: qty 9
  Filled 2013-01-25 (×3): qty 1
  Filled 2013-01-25: qty 9
  Filled 2013-01-25 (×5): qty 1

## 2013-01-25 MED ORDER — NICOTINE 21 MG/24HR TD PT24
21.0000 mg | MEDICATED_PATCH | Freq: Every day | TRANSDERMAL | Status: DC
  Administered 2013-01-26 – 2013-01-28 (×3): 21 mg via TRANSDERMAL
  Filled 2013-01-25 (×6): qty 1

## 2013-01-25 MED ORDER — DIVALPROEX SODIUM ER 500 MG PO TB24: 500.0000 mg | ORAL_TABLET | Freq: Two times a day (BID) | ORAL | Status: DC

## 2013-01-25 MED ORDER — CHLORPROMAZINE HCL 25 MG PO TABS
50.0000 mg | ORAL_TABLET | Freq: Three times a day (TID) | ORAL | Status: DC | PRN
  Administered 2013-01-25 – 2013-01-26 (×3): 50 mg via ORAL
  Filled 2013-01-25 (×5): qty 2

## 2013-01-25 MED ORDER — DIVALPROEX SODIUM ER 500 MG PO TB24
500.0000 mg | ORAL_TABLET | Freq: Two times a day (BID) | ORAL | Status: DC
  Administered 2013-01-25 – 2013-01-27 (×6): 500 mg via ORAL
  Filled 2013-01-25: qty 1
  Filled 2013-01-25: qty 6
  Filled 2013-01-25 (×3): qty 1
  Filled 2013-01-25: qty 6
  Filled 2013-01-25 (×7): qty 1

## 2013-01-25 MED ORDER — QUETIAPINE FUMARATE 200 MG PO TABS: 200.0000 mg | ORAL_TABLET | Freq: Two times a day (BID) | ORAL | Status: DC

## 2013-01-25 MED ORDER — QUETIAPINE FUMARATE 200 MG PO TABS
200.0000 mg | ORAL_TABLET | Freq: Every day | ORAL | Status: DC
  Administered 2013-01-25 – 2013-01-27 (×3): 200 mg via ORAL
  Filled 2013-01-25 (×6): qty 1
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
## 2013-01-25 NOTE — BHH Group Notes (Signed)
Adult Psychoeducational Group Note  Date:  01/25/2013 Time:  10:30 PM  Group Topic/Focus:  AA  Participation Level:  Did Not Attend  Participation Quality:  None  Affect:  None  Cognitive:  None  Insight: None  Engagement in Group:  None  Modes of Intervention:  Discussion  Additional Comments:  Coralie did not attend group.  Caroll Rancher A 01/25/2013, 10:30 PM

## 2013-01-25 NOTE — B[facility] Group Notes (Signed)
[facility] LCSW Group Therapy  01/25/2013 1:44 PM  Type of Therapy:  Group Therapy  Participation Level:  Did Not Attend  Smart, Herbert Seta 01/25/2013, 1:44 PM
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
## 2013-01-25 NOTE — Progress Notes (Signed)
The focus of this group is to educate the patient on the purpose and policies of crisis stabilization and provide a format to answer questions about their admission.  The group details unit policies and expectations of patients while admitted. Did not attend.  

## 2013-01-25 NOTE — Progress Notes (Signed)
Recreation Therapy Notes  Date: 09.23.2014 Time: 3:00pm Location: [address] Dayroom  Group Topic: Communication, Team Building, Problem Solving  Goal Area(s) Addresses:  Patient will effectively work with peer towards shared goal.  Patient will identify skill used to make activity successful.  Patient will identify how skills used during activity can be used to reach post d/c goals.   Behavioral Response: Appropriate, Minimally Engaged  Intervention: Problem Solving Activitiy  Activity: Life Boat. Patients were given a scenario about being on a sinking yacht. Patients were informed the yacht included 15 guest, 8 of which could be placed on the life boat, along with all group members. Individuals on guest list were of varying socioeconomic classes such as a Education officer, museum, Materials engineer, Midwife, Tree surgeon.   Education: Customer service manager, Discharge Planning   Education Outcome: Needs additional education  Clinical Observations/Feedback: Patient attended group session, minimally engaging in session. Patient contributed to group definition of recovery, however following that patient became disengaged in session. Patient was observed to close eyes and cover her face with her hand, while resting her arm on arm of chair she was sitting in. Patient left group for approximately 10 minutes, upon retuning patient resume posture of covering eyes with hand.   Marykay Lex Sanjuanita Condrey, LRT/CTRS  Jearl Klinefelter 01/25/2013 9:23 PM

## 2013-01-25 NOTE — Progress Notes (Signed)
Adult Psychoeducational Group Note  Date:  01/25/2013 Time:  1:38 PM  Group Topic/Focus:  Recovery Goals:   The focus of this group is to identify appropriate goals for recovery and establish a plan to achieve them.  Participation Level:  Minimal  Participation Quality:  Attentive  Affect:  Anxious, Depressed and Flat  Cognitive:  Alert  Insight: Lacking  Engagement in Group:  Engaged  Modes of Intervention:  Discussion, Exploration, Socialization and Support  Additional Comments:  Pt came to group and shared that depression and lack of support were effecting her recovery. Pt plans on changing this by getting on medications and going to therapy. Also, pt plans on going to [facility] [employer]  Cathlean Cower 01/25/2013, 1:38 PM

## 2013-01-25 NOTE — Progress Notes (Signed)
Recreation Therapy Notes  Date: 09.23.2014 Time: 2:30pm Location: [address] Dayroom  Group Topic: Animal Assisted Activities (AAA)  Behavioral Response: Did not attend.    Marykay Lex Sheryle Vice, LRT/CTRS  Jearl Klinefelter 01/25/2013 9:09 PM
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
## 2013-01-25 NOTE — [facility] Suicide Risk Assessment (Signed)
[facility] INPATIENT:  Family/Significant Other Suicide Prevention Education  Suicide Prevention Education:  Education Completed; [name] - fiance [phone]),  (name of family member/significant other) has been identified by the patient as the family member/significant other with whom the patient will be residing, and identified as the person(s) who will aid the patient in the event of a mental health crisis (suicidal ideations/suicide attempt).  With written consent from the patient, the family member/significant other has been provided the following suicide prevention education, prior to the and/or following the discharge of the patient.  The suicide prevention education provided includes the following:  Suicide risk factors  Suicide prevention and interventions  National Suicide Hotline telephone number  [facility] assessment telephone number  [facility] Emergency Assistance 911  [facility] and/or Residential Mobile Crisis Unit telephone number  Request made of family/significant other to:  Remove weapons (e.g., guns, rifles, knives), all items previously/currently identified as safety concern.    Remove drugs/medications (over-the-counter, prescriptions, illicit drugs), all items previously/currently identified as a safety concern.  The family member/significant other verbalizes understanding of the suicide prevention education information provided.  The family member/significant other agrees to remove the items of safety concern listed above.  Mr. Rolley Sims is concerned with pt's behaviors, such as wanting to hurt her aunt or hurting herself.  Mr. Rolley Sims states that pt needs to get into another facility from here and should not d/c home until getting further treatment.    Carmina Miller 01/25/2013, 3:30 PM
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
## 2013-01-25 NOTE — Progress Notes (Signed)
D:  Patient up and visible in the milieu at times.  Has been in bed most of the day and has not attended any groups.  She became angry when she was told that her medication doses had changed.  Discussed with Dr. Dub Mikes and he wants the medications left as they are for now.  She did not complete a self inventory form this morning, but affect is flat/blunted.  She admits to having off and on suicidal thoughts but agrees to seek out staff if feeling unsafe.  A:  Medications given as prescribed.  Educated patient about medication doses and times.  Encouraged her to be out of her room more.  R:  Cooperative with staff.  Verbalized understanding of medication schedule.  Minimal interaction with peers.  Safety is maintained.

## 2013-01-25 NOTE — Tx Team (Signed)
Initial Interdisciplinary Treatment Plan  PATIENT STRENGTHS: (choose at least two) Ability for insight Average or above average intelligence Communication skills General fund of knowledge Motivation for treatment/growth  PATIENT STRESSORS: Financial difficulties Health problems Marital or family conflict Substance abuse   PROBLEM LIST: Problem List/Patient Goals Date to be addressed Date deferred Reason deferred Estimated date of resolution  SI/HI 01/24/13     Command AH 01/24/13                                                DISCHARGE CRITERIA:  Ability to meet basic life and health needs Adequate post-discharge living arrangements Improved stabilization in mood, thinking, and/or behavior Need for constant or close observation no longer present Reduction of life-threatening or endangering symptoms to within safe limits Verbal commitment to aftercare and medication compliance  PRELIMINARY DISCHARGE PLAN: Attend 12-step recovery group Outpatient therapy Return to previous living arrangement  PATIENT/FAMIILY INVOLVEMENT: This treatment plan has been presented to and reviewed with the patient, Kathleen [name], and/or family member.  The patient and family have been given the opportunity to ask questions and make suggestions.  Merian Capron [facility] 01/25/2013, 12:59 AM

## 2013-01-26 LAB — GLUCOSE, CAPILLARY
Glucose-Capillary: 114 mg/dL — ABNORMAL HIGH (ref 70–99)
Glucose-Capillary: 100 mg/dL — ABNORMAL HIGH (ref 70–99)
Glucose-Capillary: 101 mg/dL — ABNORMAL HIGH (ref 70–99)

## 2013-01-26 MED ORDER — HALOPERIDOL 2 MG PO TABS
2.0000 mg | ORAL_TABLET | Freq: Two times a day (BID) | ORAL | Status: DC
  Administered 2013-01-26 – 2013-01-27 (×2): 2 mg via ORAL
  Filled 2013-01-26 (×4): qty 1

## 2013-01-26 NOTE — Tx Team (Signed)
Interdisciplinary Treatment Plan Update (Adult)  Date: 01/26/2013  Time Reviewed:  9:45 AM  Progress in Treatment: Attending groups: Yes Participating in groups:  Yes Taking medication as prescribed:  Yes Tolerating medication:  Yes Family/Significant othe contact made: Yes, contact made with Kathleen [name]'s boyfriend Patient understands diagnosis:  Yes Discussing patient identified problems/goals with staff:  Yes Medical problems stabilized or resolved:  Yes Denies suicidal/homicidal ideation: Yes Issues/concerns per patient self-inventory:  Yes Other:  New problem(s) identified: N/A  Discharge Plan or Barriers: CSW assessing for appropriate referrals.    Reason for Continuation of Hospitalization: Anxiety Depression Detox Medication Stabilization  Comments: N/A  Estimated length of stay: 3-5 days  For review of initial/current patient goals, please see plan of care.  Attendees: Patient:     Family:     Physician:  Dr. Dub Mikes 01/26/2013 11:33 AM   Nursing:   Burnetta Sabin, RN 01/26/2013 11:33 AM   Clinical Social Worker:  Reyes Ivan, LCSWA 01/26/2013 11:33 AM   Other: Onnie Boer, RN case manager 01/26/2013 11:33 AM   Other:  Trula Slade, LCSWA 01/26/2013 11:33 AM   Other: Onnie Boer, RN case manager 01/26/2013 11:33 AM   Other:  Tomasita Morrow, RN 01/26/2013 11:35 AM   Other: Roswell Miners, RN 01/26/2013 11:35 AM   Other:    Other:    Other:    Other:    Other:     Scribe for Treatment Team:   Carmina Miller, 01/26/2013 , 11:33 AM
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
## 2013-01-26 NOTE — Progress Notes (Signed)
D- Quiet and seclusive. States "I'm suicidal". "I just don't have anything to live for." No trigger identified.  Contracted with 2 staff for safety.  Tearful.  A-Spiritual support offered.  Compliant with scheduled medications and insulin. Reviewed diabetic diet education.  Reported to MHT.  R- Safety maintained.

## 2013-01-26 NOTE — Progress Notes (Signed)
D: Pt is depressed in affect and mood. Pt presents with no interaction within the milieu this evening. Pt is endorsing SI and depression. Pt contracts for safety. She reports hearing voices telling her to harm herself. Pt had a plan to hang herself with sheets. Extra sheet from pt's bed was removed.  A: Writer administered scheduled medications to pt. Continued support and availability as needed was extended to this pt. Staff continue to monitor pt with q58min checks.  R: No adverse drug reactions noted. Pt receptive to treatment. Pt remains safe at this time.

## 2013-01-26 NOTE — Progress Notes (Signed)
Patient ID: Kathleen [name], female   DOB: [birth year], 47 y.o.   MRN: [id number] She has been in bed except for  meals and to get her medications.  She says that she did not sleep well las night and that she was anxious this AM. Requested and received a prn of thorazine at 0900 and then she slept till lunch and continues to be asleep at this time. She has been encouraged to go to groups but non says that she is nausea did not request prn medication. She was. Instructed to let nursing staff see emesis the next time.

## 2013-01-26 NOTE — [facility] Group Notes (Signed)
[facility] LCSW Aftercare Discharge Planning Group Note   01/26/2013 9:19 AM  Participation Quality:  DID NOT ATTEND   [name], Kathleen Quam

## 2013-01-26 NOTE — [facility] Group Notes (Signed)
[facility] LCSW Group Therapy  01/26/2013 3:16 PM  Type of Therapy:  Group Therapy  Participation Level:  Did Not Attend Pt came to group for a few minutes, stated that she was going to the restroom, and did not return.   Smart, Evans Levee 01/26/2013, 3:16 PM

## 2013-01-26 NOTE — [facility] Group Notes (Signed)
Adult Psychoeducational Group Note  Date:  01/26/2013 Time:  9:24 PM  Group Topic/Focus:  AA Meeting  Participation Level:  Did not attend   Participation Quality:  None  Affect:  None  Cognitive:  None  Insight: None  Engagement in Group: None  Modes of Intervention:  Discussion and Education  Additional Comments:  Mry did not attend group.  Caroll Rancher A 01/26/2013, 9:24 PM

## 2013-01-27 DIAGNOSIS — F329 Major depressive disorder, single episode, unspecified: Secondary | ICD-10-CM

## 2013-01-27 DIAGNOSIS — F39 Unspecified mood [affective] disorder: Secondary | ICD-10-CM

## 2013-01-27 LAB — GLUCOSE, CAPILLARY
Glucose-Capillary: 115 mg/dL — ABNORMAL HIGH (ref 70–99)
Glucose-Capillary: 101 mg/dL — ABNORMAL HIGH (ref 70–99)

## 2013-01-27 NOTE — Progress Notes (Signed)
Patient ID: Kathleen [name], female   DOB: [birth year], 47 y.o.   MRN: [id number] Pt attended group the last 5 minutes.

## 2013-01-27 NOTE — Progress Notes (Signed)
[facility] MD Progress Note  01/27/2013 11:51 AM Kathleen [name]  MRN:  [id number] Subjective: Patient reports she is upset at being started on Haldol and discontinued on the thorazine. Unable to specify why she is upset. States she is not hearing voices as frequently today. Looking into living with her boyfriends mother. Feeling depressed and anxious. Diagnosis:   DSM5: Schizophrenia Disorders:  denies Obsessive-Compulsive Disorders:  denies Trauma-Stressor Disorders:  Posttraumatic Stress Disorder (309.81) Substance/Addictive Disorders:  Cocaine dependence Depressive Disorders:  Major Depressive Disorder - Moderate (296.22)  Axis I: Depressive Disorder NOS and Substance Abuse Axis II: Deferred Axis III:  Past Medical History  Diagnosis Date  . Anxiety   . Post traumatic stress disorder   . Schizoaffective disorder, bipolar type   . Asthma   . Headache(784.0)   . Cocaine-induced mental disorder     psychosis  . 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 Seizures 01/03/2013    Most recent 12/31/2012, per pt report.  Marland Kitchen COPD (chronic obstructive pulmonary disease)    Axis IV: economic problems, housing problems and occupational problems Axis V: 41-50 serious symptoms  ADL's:  Intact  Sleep: Fair  Appetite:  Fair  Suicidal Ideation:  Plan:  denies Intent:  denies Homicidal Ideation:  Plan:  denies Intent:  denies AEB (as evidenced by):  Psychiatric Specialty Exam: Review of Systems  Constitutional: Positive for malaise/fatigue.  HENT: Negative.   Eyes: Negative.   Respiratory: Negative.   Cardiovascular: Negative.   Gastrointestinal: Negative.   Genitourinary: Negative.   Musculoskeletal: Positive for myalgias.  Skin: Negative.   Neurological: Negative.   Endo/Heme/Allergies: Negative.   Psychiatric/Behavioral: Positive for depression and substance abuse. The patient is nervous/anxious.     Blood pressure 114/74, pulse 111, temperature 97.9 F (36.6 C),  temperature source Oral, resp. rate 16, height 5\' 2"  (1.575 m), weight 83.462 kg (184 lb), last menstrual period 12/27/2012.Body mass index is 33.65 kg/(m^2).  General Appearance: Disheveled  Eye Solicitor::  Fair  Speech:  Loud, fast  Volume:  Increased  Mood:  Depressed and Irritable  Affect:  Depressed  Thought Process:  Circumstantial  Orientation:  Full (Time, Place, and Person)  Thought Content:  Rumination  Suicidal Thoughts:  No  Homicidal Thoughts:  No  Memory:  Immediate;   Fair Recent;   Fair Remote;   Fair  Judgement:  Impaired  Insight:  Shallow  Psychomotor Activity:  Increased  Concentration:  Fair  Recall:  Fair  Akathisia:  No  Handed:  Right  AIMS (if indicated):     Assets:  Desire for Improvement  Sleep:  Number of Hours: 6.75   Current Medications: Current Facility-Administered Medications  Medication Dose Route Frequency Provider Last Rate Last Dose  . acetaminophen (TYLENOL) tablet 650 mg  650 mg Oral Q6H PRN Evanna Cori Burkett, NP      . albuterol (PROVENTIL HFA;VENTOLIN HFA) 108 (90 BASE) MCG/ACT inhaler 2 puff  2 puff Inhalation Q6H PRN Evanna Janann August, NP      . alum & mag hydroxide-simeth (MAALOX/MYLANTA) 200-200-20 MG/5ML suspension 30 mL  30 mL Oral Q4H PRN Evanna Janann August, NP      . divalproex (DEPAKOTE ER) 24 hr tablet 500 mg  500 mg Oral BID Rachael Fee, MD   500 mg at 01/27/13 0815  . haloperidol (HALDOL) tablet 2 mg  2 mg Oral BID Rachael Fee, MD   2 mg at 01/27/13 0815  . insulin aspart (novoLOG) injection  0-20 Units  0-20 Units Subcutaneous TID WC Evanna Janann August, NP   3 Units at 01/26/13 1737  . insulin aspart (novoLOG) injection 2 Units  2 Units Subcutaneous TID AC Evanna Janann August, NP   2 Units at 01/26/13 1737  . magnesium hydroxide (MILK OF MAGNESIA) suspension 30 mL  30 mL Oral Daily PRN Evanna Janann August, NP      . nicotine (NICODERM CQ - dosed in mg/24 hours) patch 21 mg  21 mg Transdermal Daily Rachael Fee, MD    21 mg at 01/27/13 0815  . QUEtiapine (SEROQUEL) tablet 200 mg  200 mg Oral QHS Rachael Fee, MD   200 mg at 01/26/13 2159  . QUEtiapine (SEROQUEL) tablet 200 mg  200 mg Oral BID Rachael Fee, MD   200 mg at 01/27/13 0815  . traZODone (DESYREL) tablet 50 mg  50 mg Oral QHS PRN,MR X 1 Evanna Janann August, NP        Lab Results:  Results for orders placed during the hospital encounter of 01/24/13 (from the past 48 hour(s))  GLUCOSE, CAPILLARY     Status: Abnormal   Collection Time    01/25/13  5:00 PM      Result Value Range   Glucose-Capillary 117 (*) 70 - 99 mg/dL  GLUCOSE, CAPILLARY     Status: Abnormal   Collection Time    01/25/13  9:09 PM      Result Value Range   Glucose-Capillary 114 (*) 70 - 99 mg/dL  GLUCOSE, CAPILLARY     Status: Abnormal   Collection Time    01/26/13  6:14 AM      Result Value Range   Glucose-Capillary 106 (*) 70 - 99 mg/dL  GLUCOSE, CAPILLARY     Status: Abnormal   Collection Time    01/26/13 11:39 AM      Result Value Range   Glucose-Capillary 100 (*) 70 - 99 mg/dL  GLUCOSE, CAPILLARY     Status: Abnormal   Collection Time    01/26/13  4:46 PM      Result Value Range   Glucose-Capillary 132 (*) 70 - 99 mg/dL   Comment 1 Notify RN    GLUCOSE, CAPILLARY     Status: Abnormal   Collection Time    01/26/13  8:44 PM      Result Value Range   Glucose-Capillary 101 (*) 70 - 99 mg/dL  GLUCOSE, CAPILLARY     Status: None   Collection Time    01/27/13  6:31 AM      Result Value Range   Glucose-Capillary 89  70 - 99 mg/dL    Physical Findings: AIMS: Facial and Oral Movements Muscles of Facial Expression: None, normal Lips and Perioral Area: None, normal Jaw: None, normal Tongue: None, normal,Extremity Movements Upper (arms, wrists, hands, fingers): None, normal Lower (legs, knees, ankles, toes): None, normal, Trunk Movements Neck, shoulders, hips: None, normal, Overall Severity Severity of abnormal movements (highest score from questions  above): None, normal Incapacitation due to abnormal movements: None, normal Patient's awareness of abnormal movements (rate only patient's report): No Awareness, Dental Status Current problems with teeth and/or dentures?: No Does patient usually wear dentures?: No  CIWA:    COWS:     Treatment Plan Summary: Daily contact with patient to assess and evaluate symptoms and progress in treatment Medication management  Plan:Discontinue haldol. Continue to monitor.  Medical Decision Making Problem Points:  Established problem, stable/improving (1), Review of last therapy  session (1) and Review of psycho-social stressors (1) Data Points:  Review of medication regiment & side effects (2)  I certify that inpatient services furnished can reasonably be expected to improve the patient's condition.   Ariele Vidrio 01/27/2013, 11:51 AM

## 2013-01-27 NOTE — Progress Notes (Signed)
The focus of this group is to educate the patient on the purpose and policies of crisis stabilization and provide a format to answer questions about their admission.  The group details unit policies and expectations of patients while admitted. Patient attended group this morning and was engaging. 

## 2013-01-27 NOTE — [facility] Group Notes (Signed)
[facility] LCSW Group Therapy  01/27/2013 3:51 PM  Type of Therapy:  Group Therapy  Participation Level:  Did Not Attend   Smart, Shaquille Murdy 01/27/2013, 3:51 PM  

## 2013-01-27 NOTE — Progress Notes (Signed)
Patient did not attend the evening karaoke group. Pt remained in bed during group.  

## 2013-01-27 NOTE — Progress Notes (Signed)
Patient ID: Kathleen [name], female   DOB: May 10, 1965, 47 y.o.   MRN: [id number] She has  Been in bed most of day. Would get up for part of the a group after being told to do so than would go back to bed. She c/o stomach cramping this after noon but has not requested a prn.  CBG  At lunch was 97 and sliding scale coverage was held. She said that she had a place to go after after discharge.

## 2013-01-27 NOTE — Progress Notes (Signed)
Pt laying in bed resting with eyes closed. Respirations even and unlabored. No distress noted.  

## 2013-01-27 NOTE — Progress Notes (Signed)
Recreation Therapy Notes  Date: 09.25.2014 Time: 2:30pm Location: [address] Day[address]  Group Topic: Animal Assisted Activities (AAA)  Behavioral Response: Did not attend  Kendy Haston L Irwin Toran, LRT/CTRS  Kechia Yahnke L 01/27/2013 5:03 PM 

## 2013-01-28 LAB — GLUCOSE, CAPILLARY: Glucose-Capillary: 105 mg/dL — ABNORMAL HIGH (ref 70–99)

## 2013-01-28 MED ORDER — INSULIN ASPART 100 UNIT/ML ~~LOC~~ SOLN: 2.0000 [IU] | Freq: Three times a day (TID) | SUBCUTANEOUS | Status: DC

## 2013-01-28 MED ORDER — TRAZODONE HCL 50 MG PO TABS
50.0000 mg | ORAL_TABLET | Freq: Every evening | ORAL | Status: DC | PRN
  Filled 2013-01-28: qty 3

## 2013-01-28 MED ORDER — DIVALPROEX SODIUM ER 500 MG PO TB24: 500.0000 mg | ORAL_TABLET | Freq: Two times a day (BID) | ORAL | Status: DC

## 2013-01-28 MED ORDER — TRAZODONE HCL 50 MG PO TABS: ORAL_TABLET | ORAL | Status: DC

## 2013-01-28 MED ORDER — QUETIAPINE FUMARATE 200 MG PO TABS: ORAL_TABLET | ORAL | Status: DC

## 2013-01-28 MED ORDER — INSULIN ASPART 100 UNIT/ML ~~LOC~~ SOLN: 0.0000 [IU] | Freq: Three times a day (TID) | SUBCUTANEOUS | Status: DC
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
## 2013-01-28 NOTE — Progress Notes (Signed)
Recreation Therapy Notes  Date: 09.25.2014 Time: 3:00pm Location: [address] Day[address]  Group Topic: Leisure Education  Goal Area(s) Addresses:  Patient will identify leisure areas of interest. Patient will identify   Behavioral Response: Did not attend.   Marykay Lex Kirklin Mcduffee, LRT/CTRS  Arham Symmonds L 01/28/2013 8:30 AM

## 2013-01-28 NOTE — Progress Notes (Signed)
Adult Psychoeducational Group Note  Date:  01/28/2013 Time:  10:57 AM  Group Topic/Focus:  Recovery Goals:   The focus of this group is to identify appropriate goals for recovery and establish a plan to achieve them.  Participation Level:  Active  Participation Quality:  Appropriate  Affect:  Appropriate  Cognitive:  Appropriate  Insight: Appropriate  Engagement in Group:  Engaged  Modes of Intervention:  Activity and Discussion  Additional Comments:  The focus of the group was SMART goals in relation to relapse and recovery. The SMART acronym was broken down and explained in detail. Each pt received a [facility] journal and was encouraged to write their goals, feelings, and questions down.   Tora Perches N 01/28/2013, 10:57 AM

## 2013-01-28 NOTE — Tx Team (Signed)
Interdisciplinary Treatment Plan Update (Adult)  Date: 01/28/2013  Time Reviewed:  9:45 AM  Progress in Treatment: Attending groups: Yes Participating in groups:  Yes Taking medication as prescribed:  Yes Tolerating medication:  Yes Family/Significant othe contact made: Yes Patient understands diagnosis:  Yes Discussing patient identified problems/goals with staff:  Yes Medical problems stabilized or resolved:  Yes Denies suicidal/homicidal ideation: Yes Issues/concerns per patient self-inventory:  Yes Other:  New problem(s) identified: N/A  Discharge Plan or Barriers: Pt will follow up at [facility] for medication management and therapy.    Reason for Continuation of Hospitalization: Stable to d/c today  Comments: N/A  Estimated length of stay: D/C today  For review of initial/current patient goals, please see plan of care.  Attendees: Patient:     Family:     Physician:  Dr. Daleen Bo 01/28/2013 11:24 AM   Nursing:   Roswell Miners, RN 01/28/2013 11:24 AM   Clinical Social Worker:  Reyes Ivan, LCSWA 01/28/2013 11:24 AM   Other: Burnetta Sabin, RN  01/28/2013 11:24 AM   Other:  Trula Slade, LCSWA 01/28/2013 11:24 AM   Other:     Other:     Other:    Other:    Other:    Other:    Other:    Other:     Scribe for Treatment Team:   Carmina Miller, 01/28/2013 , 11:24 AM

## 2013-01-28 NOTE — Progress Notes (Signed)
Patient discharged home w/samples, scripts and instructions w/followup appointments. Denies Si or HI and no AVH. No complaints voiced. Was anxious about getting out of here on her time but all worked out and she was appreciative for all the help. Husband came to pick her up.

## 2013-01-28 NOTE — BHH Suicide Risk Assessment (Signed)
Suicide Risk Assessment  Discharge Assessment     Demographic Factors:  Low socioeconomic status and Living alone, [place] female  Mental Status Per Nursing Assessment::   On Admission:  Suicidal ideation indicated by patient;Suicide plan;Plan includes specific time, place, or method;Self-harm thoughts;Intention to act on suicide plan;Belief that plan would result in death;Thoughts of violence towards others;Plan to harm others  Current Mental Status by Physician: Patient alert and oriented to all spheres. Speech normal. Mood improved. Denies SI/HI/VH/AH. Fair insight and judgement.  Loss Factors: Financial problems/change in socioeconomic status  Historical Factors: Family history of mental illness or substance abuse and Impulsivity  Risk Reduction Factors:   Positive social support  Continued Clinical Symptoms:  Depression:   Recent sense of peace/wellbeing Alcohol/Substance Abuse/Dependencies  Cognitive Features That Contribute To Risk:  Thought constriction (tunnel vision)    Suicide Risk:  Minimal: No identifiable suicidal ideation.  Patients presenting with no risk factors but with morbid ruminations; may be classified as minimal risk based on the severity of the depressive symptoms  Discharge Diagnoses:   AXIS I:  Anxiety Disorder NOS, Depressive Disorder NOS and Substance Abuse AXIS II:  Deferred AXIS III:   Past Medical History  Diagnosis Date  . Anxiety   . Post traumatic stress disorder   . Schizoaffective disorder, bipolar type   . Asthma   . Headache(784.0)   . Cocaine-induced mental disorder     psychosis  . 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 Seizures 01/03/2013    Most recent 12/31/2012, per pt report.  Marland Kitchen COPD (chronic obstructive pulmonary disease)    AXIS IV:  economic problems, housing problems, occupational problems and other psychosocial or environmental problems AXIS V:  61-70 mild symptoms  Plan Of Care/Follow-up  recommendations:  Activity:  as tolerated Diet:  regular  Is patient on multiple antipsychotic therapies at discharge:  No   Has Patient had three or more failed trials of antipsychotic monotherapy by history:  No  Recommended Plan for Multiple Antipsychotic Therapies: NA  Cynthea Zachman 01/28/2013, 9:53 AM

## 2013-01-28 NOTE — Progress Notes (Signed)
[facility] Adult Case Management Discharge Plan :  Will you be returning to the same living situation after discharge: Yes,  returning home with boyfriend At discharge, do you have transportation home?:Yes,  boyfriend picked pt up Do you have the ability to pay for your medications:Yes,  access to meds  Release of information consent forms completed and in the chart;  Patient's signature needed at discharge.  Patient to Follow up at: Follow-up Information   Follow up with Arna Medici On 02/01/2013. (Appointment scheduled on this date for hospital discharge, for medication management and therapy.  Walk in between 8:30 - 11:00 am.  Referral # [id number])    Contact information:   Physical Address: [address] [place], [place] [address]  Mailing Address: [address], [place], [place] [address] Phone: [phone] Fax: [phone]      Patient denies SI/HI:   Yes,  denies SI/HI    Safety Planning and Suicide Prevention discussed:  Yes,  discussed with pt and pt's boyfriend.  See suicide prevention education note.  Kathleen [name] 01/28/2013, 11:41 AM

## 2013-01-28 NOTE — Progress Notes (Signed)
D: Pt in bed resting with eyes closed. Respirations even and unlabored. Pt appears to be in no signs of distress at this time. A: Q15min checks remains for this pt. R: Pt remains safe at this time.   

## 2013-01-28 NOTE — BHH Group Notes (Signed)
[facility] LCSW Aftercare Discharge Planning Group Note   01/28/2013 8:45 AM  Participation Quality:  Alert and Appropriate   Mood/Affect:  Appropriate and Calm  Depression Rating:  0  Anxiety Rating:  0  Thoughts of Suicide:  Pt denies SI/HI  Will you contract for safety?   Yes  Current AVH:  Pt denies  Plan for Discharge/Comments:  Pt attended discharge planning group and actively participated in group.  CSW provided pt with today's workbook.  Pt reports feeling stable to d/c today.  Pt states that she will return home to her boyfriend in [place] and follow up at [facility] for medication management and therapy.  No further needs voiced by pt at this time.    Transportation Means: Pt reports access to transportation - boyfriend will pick pt up  Supports: No supports mentioned at this time  Reyes Ivan, LCSWA 01/28/2013 9:50 AM

## 2013-02-01 NOTE — Progress Notes (Signed)
Patient Discharge Instructions:  After Visit Summary (AVS):   Faxed to:  02/01/13 Discharge Summary Note:   Faxed to:  02/01/13 Psychiatric Admission Assessment Note:   Faxed to:  02/01/13 Suicide Risk Assessment - Discharge Assessment:   Faxed to:  02/01/13 Faxed/Sent to the Next Level Care provider:  02/01/13 Faxed to [facility] @ [phone]  Jerelene Redden, 02/01/2013, 4:16 PM

## 2013-03-24 ENCOUNTER — Institutional Professional Consult (permissible substitution): Payer: Self-pay | Admitting: Internal Medicine

## 2013-03-28 ENCOUNTER — Encounter: Payer: Self-pay | Admitting: Internal Medicine

## 2013-03-28 ENCOUNTER — Ambulatory Visit (INDEPENDENT_AMBULATORY_CARE_PROVIDER_SITE_OTHER): Payer: Medicaid Other | Admitting: Internal Medicine

## 2013-03-28 VITALS — BP 112/80 | HR 80 | Temp 98.2°F | Ht 62.0 in | Wt 199.0 lb

## 2013-03-28 DIAGNOSIS — I10 Essential (primary) hypertension: Secondary | ICD-10-CM

## 2013-03-28 DIAGNOSIS — F1410 Cocaine abuse, uncomplicated: Secondary | ICD-10-CM

## 2013-03-28 DIAGNOSIS — R0609 Other forms of dyspnea: Secondary | ICD-10-CM

## 2013-03-28 DIAGNOSIS — R0600 Dyspnea, unspecified: Secondary | ICD-10-CM | POA: Insufficient documentation

## 2013-03-28 NOTE — Patient Instructions (Signed)
Stop lisinopril indefinitely  Pantoprazole (protonix) 40 mg   Take 30-60 min before first meal of the day and Pepcid 20 mg one bedtime until return to office - this is the best way to tell whether stomach acid is contributing to your problem.   GERD (REFLUX)  is an extremely common cause of respiratory symptoms, many times with no significant heartburn at all.    It can be treated with medication, but also with lifestyle changes including avoidance of late meals, excessive alcohol, smoking cessation, and avoid fatty foods, chocolate, peppermint, colas, red wine, and acidic juices such as orange juice.  NO MINT OR MENTHOL PRODUCTS SO NO COUGH DROPS  USE SUGARLESS CANDY INSTEAD (jolley ranchers or Stover's)  NO OIL BASED VITAMINS - use powdered substitutes.  Please remember to go to the x-ray department downstairs for your tests - we will call you with the results when they are available.    If not better in 3 weeks return with all medications in hand to regroup - if better tell your friends !

## 2013-03-28 NOTE — Progress Notes (Signed)
  Subjective:    Patient ID: Kathleen [name], female    DOB: Aug 02, 1965    MRN: [id number]  HPI  47 yobf quit smoking 01/2013 abruptly ill 2012 > [facility] and never better since even p quit smoking so referred 03/28/2013 by Dr Kathleen [name] in [place] for copd eval  03/28/2013 1st [name]/ Kathleen [name] / pt on chronic ACEi  Chief Complaint  Patient presents with  . Pulmonary Consult    Referred per Dr. Olena [name]. Pt states dxed with COPD back in 2010. She c/o SOB with or without exertion getting worse over the past yr. She states that she gets out of breath just walking approx 10 ft. She also c/o cough for the past year- prod with grey sputum.   cough is worse when lie down every night and each am producing no more than a tbsp in am / strangling sensationin ams Stopped all inhalers and made no difference in terms of cough or sob x across the room  No obvious day to day or daytime variabilty or assoc  Pleuritic or ex cp or chest tightness, subjective wheeze overt sinus or hb symptoms. No unusual exp hx or h/o childhood pna/ asthma or knowledge of premature birth.  Sleeping ok without nocturnal  or early am exacerbation  of respiratory  c/o's or need for noct saba. Also denies any obvious fluctuation of symptoms with weather or environmental changes or other aggravating or alleviating factors except as outlined above   Current Medications, Allergies, Complete Past Medical History, Past Surgical History, Family History, and Social History were reviewed in [employer] record.   .           Review of Systems  Constitutional: Negative for fever, chills and unexpected weight change.  HENT: Negative for congestion, dental problem, ear pain, nosebleeds, postnasal drip, rhinorrhea, sinus pressure, sneezing, sore throat, trouble swallowing and voice change.   Eyes: Negative for visual disturbance.  Respiratory: Positive for cough and shortness of breath.  Negative for choking.   Cardiovascular: Positive for chest pain and leg swelling.  Gastrointestinal: Negative for vomiting, abdominal pain and diarrhea.  Genitourinary: Negative for difficulty urinating.  Musculoskeletal: Positive for arthralgias.  Skin: Negative for rash.  Neurological: Negative for tremors, syncope and headaches.  Hematological: Does not bruise/bleed easily.       Objective:   Physical Exam  Hoarse amb wf nad  Wt Readings from Last 3 Encounters:  03/28/13 199 lb (90.266 kg)  01/24/13 184 lb (83.462 kg)  01/07/13 183 lb (83.008 kg)      HEENT mild turbinate edema.  Oropharynx no thrush or excess pnd or cobblestoning.  No JVD or cervical adenopathy. Mild accessory muscle hypertrophy. Trachea midline, nl thryroid. Chest was slt hyperinflated by percussion with slt diminished breath sounds and min  increased exp time without wheeze. Hoover sign positive at mid inspiration. Regular rate and rhythm without murmur gallop or rub or increase P2 or edema.  Abd: no hsm, nl excursion. Ext warm without cyanosis or clubbing.    01/04/13 Moderate bibasilar atelectasis.  cxr 03/28/13 requested, not done      Assessment & Plan:

## 2013-03-29 DIAGNOSIS — I10 Essential (primary) hypertension: Secondary | ICD-10-CM | POA: Insufficient documentation

## 2013-03-29 MED ORDER — PANTOPRAZOLE SODIUM 40 MG PO TBEC: 40.0000 mg | DELAYED_RELEASE_TABLET | Freq: Every day | ORAL | Status: DC

## 2013-03-29 MED ORDER — FAMOTIDINE 20 MG PO TABS: ORAL_TABLET | ORAL | Status: DC

## 2013-03-29 NOTE — Assessment & Plan Note (Signed)
She says she's not taking acei consistently so will leave off and observe bp off rx

## 2013-03-29 NOTE — Assessment & Plan Note (Signed)
Concerned she may have some pulmonary effects of cocaine > rec repeat cxr now that says she off cocaine > did not go for cxr and needs f/u next ov.

## 2013-03-29 NOTE — Assessment & Plan Note (Addendum)
03/28/2013  Walked RA x 3 laps @ 185 ft each stopped due to   - spirometry 03/28/2013 > wnl x fef 25-7560%  - Trial off ACEi 03/28/2013 >  Symptoms are markedly disproportionate to objective findings and not clear this is a lung problem but pt does appear to have difficult airway management issues. DDX of  difficult airways managment all start with A and  include Adherence, Ace Inhibitors, Acid Reflux, Active Sinus Disease, Alpha 1 Antitripsin deficiency, Anxiety masquerading as Airways dz,  ABPA,  allergy(esp in young), Aspiration (esp in elderly), Adverse effects of DPI,  Active smokers, plus two Bs  = Bronchiectasis and Beta blocker use..and one C= CHF  ACEi leading the list of suspects here > try off   Adverse effects of dpi's > since has no airflow obst, ok to leave off  ? Acid (or non-acid) GERD > always difficult to exclude as up to 75% of pts in some series report no assoc GI/ Heartburn symptoms> rec max (24h)  acid suppression and diet restrictions/ reviewed and instructions given in writing  ? Anxiety > dx of exclusion  ? Active smoking > denies, reinforced how impt to stay off  Plan will be to try off acei and regroup in 3 weeks

## 2013-06-07 ENCOUNTER — Other Ambulatory Visit: Payer: Self-pay | Admitting: Radiology

## 2014-03-06 ENCOUNTER — Encounter: Payer: Self-pay | Admitting: Internal Medicine

## 2014-09-09 ENCOUNTER — Encounter (HOSPITAL_COMMUNITY): Payer: Self-pay | Admitting: Emergency Medicine

## 2014-09-09 ENCOUNTER — Emergency Department (HOSPITAL_COMMUNITY)
Admission: EM | Admit: 2014-09-09 | Discharge: 2014-09-10 | Disposition: A | Discharge status: Other Acute Inpatient Hospital | Payer: Medicaid Other | Attending: Emergency Medicine | Admitting: Emergency Medicine

## 2014-09-09 DIAGNOSIS — Z3202 Encounter for pregnancy test, result negative: Secondary | ICD-10-CM | POA: Diagnosis not present

## 2014-09-09 DIAGNOSIS — G629 Polyneuropathy, unspecified: Secondary | ICD-10-CM | POA: Insufficient documentation

## 2014-09-09 DIAGNOSIS — Z79899 Other long term (current) drug therapy: Secondary | ICD-10-CM | POA: Diagnosis not present

## 2014-09-09 DIAGNOSIS — R45851 Suicidal ideations: Secondary | ICD-10-CM | POA: Diagnosis not present

## 2014-09-09 DIAGNOSIS — Z880 Allergy status to penicillin: Secondary | ICD-10-CM | POA: Diagnosis not present

## 2014-09-09 DIAGNOSIS — Z720 Tobacco use: Secondary | ICD-10-CM | POA: Diagnosis not present

## 2014-09-09 DIAGNOSIS — E119 Type 2 diabetes mellitus without complications: Secondary | ICD-10-CM | POA: Insufficient documentation

## 2014-09-09 DIAGNOSIS — Z008 Encounter for other general examination: Secondary | ICD-10-CM | POA: Diagnosis present

## 2014-09-09 DIAGNOSIS — J449 Chronic obstructive pulmonary disease, unspecified: Secondary | ICD-10-CM | POA: Insufficient documentation

## 2014-09-09 DIAGNOSIS — Z794 Long term (current) use of insulin: Secondary | ICD-10-CM | POA: Insufficient documentation

## 2014-09-09 HISTORY — DX: Polyneuropathy, unspecified: G62.9

## 2014-09-09 LAB — COMPREHENSIVE METABOLIC PANEL
ALK PHOS: 77 U/L (ref 38–126)
ALT: 27 U/L (ref 14–54)
AST: 21 U/L (ref 15–41)
Albumin: 4 g/dL (ref 3.5–5.0)
Anion gap: 6 (ref 5–15)
BILIRUBIN TOTAL: 0.3 mg/dL (ref 0.3–1.2)
BUN: 13 mg/dL (ref 6–20)
CO2: 27 mmol/L (ref 22–32)
Calcium: 9.5 mg/dL (ref 8.9–10.3)
Chloride: 104 mmol/L (ref 101–111)
Creatinine, Ser: 1 mg/dL (ref 0.44–1.00)
GFR calc non Af Amer: >60 mL/min (ref >60)
Glucose, Bld: 88 mg/dL (ref 70–99)
POTASSIUM: 4 mmol/L (ref 3.5–5.1)
Sodium: 137 mmol/L (ref 135–145)
Total Protein: 8.1 g/dL (ref 6.5–8.1)

## 2014-09-09 LAB — CBG MONITORING, ED
GLUCOSE-CAPILLARY: 80 mg/dL (ref 70–99)
GLUCOSE-CAPILLARY: 127 mg/dL — AB (ref 70–99)
Glucose-Capillary: 114 mg/dL — ABNORMAL HIGH (ref 70–99)

## 2014-09-09 LAB — URINE MICROSCOPIC-ADD ON

## 2014-09-09 LAB — CBC WITH DIFFERENTIAL/PLATELET
Basophils Absolute: 0 10*3/uL (ref 0.0–0.1)
Basophils Relative: 0 % (ref 0–1)
EOS ABS: 0.1 10*3/uL (ref 0.0–0.7)
EOS PCT: 1 % (ref 0–5)
HEMATOCRIT: 38.9 % (ref 36.0–46.0)
Hemoglobin: 13.5 g/dL (ref 12.0–15.0)
Lymphocytes Relative: 31 % (ref 12–46)
Lymphs Abs: 3.1 10*3/uL (ref 0.7–4.0)
MCH: 24 pg — ABNORMAL LOW (ref 26.0–34.0)
MCHC: 34.7 g/dL (ref 30.0–36.0)
MCV: 69.2 fL — AB (ref 78.0–100.0)
MONO ABS: 0.4 10*3/uL (ref 0.1–1.0)
Monocytes Relative: 4 % (ref 3–12)
Neutro Abs: 6.4 10*3/uL (ref 1.7–7.7)
Neutrophils Relative %: 64 % (ref 43–77)
Platelets: 274 10*3/uL (ref 150–400)
RBC: 5.62 MIL/uL — ABNORMAL HIGH (ref 3.87–5.11)
RDW: 17.2 % — AB (ref 11.5–15.5)
WBC: 10 10*3/uL (ref 4.0–10.5)

## 2014-09-09 LAB — RAPID URINE DRUG SCREEN, HOSP PERFORMED
Amphetamines: NOT DETECTED
Barbiturates: NOT DETECTED
Benzodiazepines: NOT DETECTED
Cocaine: POSITIVE — AB
Opiates: NOT DETECTED
TETRAHYDROCANNABINOL: NOT DETECTED

## 2014-09-09 LAB — URINALYSIS, ROUTINE W REFLEX MICROSCOPIC
Bilirubin Urine: NEGATIVE
GLUCOSE, UA: NEGATIVE mg/dL
HGB URINE DIPSTICK: NEGATIVE
Ketones, ur: NEGATIVE mg/dL
NITRITE: NEGATIVE
PROTEIN: NEGATIVE mg/dL
Specific Gravity, Urine: 1.025 (ref 1.005–1.030)
Urobilinogen, UA: 0.2 mg/dL (ref 0.0–1.0)
pH: 5.5 (ref 5.0–8.0)

## 2014-09-09 LAB — ACETAMINOPHEN LEVEL: Acetaminophen (Tylenol), Serum: <10 ug/mL — ABNORMAL LOW (ref 10–30)

## 2014-09-09 LAB — ETHANOL

## 2014-09-09 LAB — VALPROIC ACID LEVEL: Valproic Acid Lvl: <10 ug/mL — ABNORMAL LOW (ref 50.0–100.0)

## 2014-09-09 LAB — SALICYLATE LEVEL

## 2014-09-09 LAB — PREGNANCY, URINE: Preg Test, Ur: NEGATIVE

## 2014-09-09 MED ORDER — NICOTINE 21 MG/24HR TD PT24
21.0000 mg | MEDICATED_PATCH | Freq: Every day | TRANSDERMAL | Status: DC
  Administered 2014-09-09 – 2014-09-10 (×2): 21 mg via TRANSDERMAL
  Filled 2014-09-09 (×2): qty 1

## 2014-09-09 MED ORDER — BENAZEPRIL HCL 10 MG PO TABS
20.0000 mg | ORAL_TABLET | Freq: Every day | ORAL | Status: DC
  Administered 2014-09-09 – 2014-09-10 (×2): 20 mg via ORAL
  Filled 2014-09-09 (×2): qty 1

## 2014-09-09 MED ORDER — CLONAZEPAM 0.5 MG PO TABS
1.0000 mg | ORAL_TABLET | Freq: Three times a day (TID) | ORAL | Status: DC | PRN
  Administered 2014-09-09 – 2014-09-10 (×4): 1 mg via ORAL
  Filled 2014-09-09 (×4): qty 2

## 2014-09-09 MED ORDER — ALBUTEROL SULFATE HFA 108 (90 BASE) MCG/ACT IN AERS: 2.0000 | INHALATION_SPRAY | Freq: Four times a day (QID) | RESPIRATORY_TRACT | Status: DC | PRN

## 2014-09-09 MED ORDER — FAMOTIDINE 20 MG PO TABS
20.0000 mg | ORAL_TABLET | Freq: Every day | ORAL | Status: DC
  Administered 2014-09-09 – 2014-09-10 (×2): 20 mg via ORAL
  Filled 2014-09-09 (×2): qty 1

## 2014-09-09 MED ORDER — DIVALPROEX SODIUM ER 500 MG PO TB24
500.0000 mg | ORAL_TABLET | Freq: Two times a day (BID) | ORAL | Status: DC
  Administered 2014-09-09 – 2014-09-10 (×3): 500 mg via ORAL
  Filled 2014-09-09 (×3): qty 1

## 2014-09-09 MED ORDER — LORAZEPAM 2 MG/ML IJ SOLN
2.0000 mg | Freq: Once | INTRAMUSCULAR | Status: AC
  Administered 2014-09-09: 2 mg via INTRAVENOUS
  Filled 2014-09-09: qty 1

## 2014-09-09 MED ORDER — ACETAMINOPHEN 325 MG PO TABS: 650.0000 mg | ORAL_TABLET | ORAL | Status: DC | PRN

## 2014-09-09 MED ORDER — PANTOPRAZOLE SODIUM 40 MG PO TBEC
40.0000 mg | DELAYED_RELEASE_TABLET | Freq: Every day | ORAL | Status: DC
  Administered 2014-09-09 – 2014-09-10 (×2): 40 mg via ORAL
  Filled 2014-09-09 (×2): qty 1

## 2014-09-09 MED ORDER — ONDANSETRON HCL 4 MG PO TABS: 4.0000 mg | ORAL_TABLET | Freq: Three times a day (TID) | ORAL | Status: DC | PRN

## 2014-09-09 MED ORDER — LORAZEPAM 2 MG/ML IJ SOLN: 2.0000 mg | Freq: Once | INTRAMUSCULAR | Status: AC

## 2014-09-09 MED ORDER — QUETIAPINE FUMARATE 100 MG PO TABS
400.0000 mg | ORAL_TABLET | Freq: Every day | ORAL | Status: DC
  Administered 2014-09-09: 400 mg via ORAL
  Filled 2014-09-09: qty 4

## 2014-09-09 MED ORDER — ALUM & MAG HYDROXIDE-SIMETH 200-200-20 MG/5ML PO SUSP: 30.0000 mL | ORAL | Status: DC | PRN

## 2014-09-09 MED ORDER — HYDROCHLOROTHIAZIDE 12.5 MG PO CAPS
12.5000 mg | ORAL_CAPSULE | Freq: Every day | ORAL | Status: DC
  Administered 2014-09-09 – 2014-09-10 (×2): 12.5 mg via ORAL
  Filled 2014-09-09 (×2): qty 1

## 2014-09-09 MED ORDER — INSULIN ASPART 100 UNIT/ML ~~LOC~~ SOLN
0.0000 [IU] | Freq: Three times a day (TID) | SUBCUTANEOUS | Status: DC
  Administered 2014-09-09: 1 [IU] via SUBCUTANEOUS
  Filled 2014-09-09: qty 1

## 2014-09-09 MED ORDER — GABAPENTIN 300 MG PO CAPS
300.0000 mg | ORAL_CAPSULE | Freq: Three times a day (TID) | ORAL | Status: DC
  Administered 2014-09-09 – 2014-09-10 (×4): 300 mg via ORAL
  Filled 2014-09-09 (×5): qty 1

## 2014-09-09 MED ORDER — RISPERIDONE 1 MG PO TABS
ORAL_TABLET | ORAL | Status: AC
  Filled 2014-09-09: qty 1

## 2014-09-09 MED ORDER — RISPERIDONE 1 MG PO TABS
1.0000 mg | ORAL_TABLET | Freq: Two times a day (BID) | ORAL | Status: DC
  Administered 2014-09-09 – 2014-09-10 (×3): 1 mg via ORAL
  Filled 2014-09-09 (×3): qty 1

## 2014-09-09 MED ORDER — BENAZEPRIL-HYDROCHLOROTHIAZIDE 20-12.5 MG PO TABS: 1.0000 | ORAL_TABLET | Freq: Every day | ORAL | Status: DC
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
## 2014-09-09 NOTE — ED Notes (Signed)
Pt. C/o anxiety, prn klonopin will be given.

## 2014-09-09 NOTE — ED Notes (Addendum)
Pt apologized to sitter for her actions.

## 2014-09-09 NOTE — ED Notes (Signed)
Pt resting in bed at this time, sitter at bedside.

## 2014-09-09 NOTE — ED Notes (Signed)
Pt requesting klonopin

## 2014-09-09 NOTE — ED Notes (Addendum)
Mrs. Kathleen [name] was requesting to use the telephone and sitter Evlyn Courier(Ashley Anderson)  advised pt, at my request, that this would be her last phone call for the night, that she was only allowed 3 phone calls. Pt again became upset and yelling and cursing to family member on the phone that we would not let her eat and she wasn't getting proper treatment. I advised pt that I would need the phone (pt was yelling and cursing). Pt began yelling at sitter, Evlyn Couriershley Anderson, outside the door who is sitting with another pt. Sitter Benay SpiceDanielle Rory, that is in the room with room 15 came to doorway, telling sitter that she needed to quit provoking the pt and to leave the pt alone.  Verdon CumminsJesse, [facility] was called down to ED for Driscilla MoatsSitter, Danielle Rory's actions. [employer] was dealing with pt as well as this nurse.
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
## 2014-09-09 NOTE — ED Notes (Addendum)
Pt given graham crackers. Pt stating she just needs help, that she highly suicidal and needs ACT team to follow her to make sure she takes her medicine.

## 2014-09-09 NOTE — ED Notes (Signed)
Pt. C/o being hungry and states she has not at all day. Carb modified tray ordered for patient, pt. Given graham crackers and peanut butter to eat.

## 2014-09-09 NOTE — ED Notes (Signed)
Patient states that the only person she wants on her visitation list is her fiance Dexter.

## 2014-09-09 NOTE — ED Notes (Signed)
Pt reports hx of schizophrenia, bipolar and states she has had SI with plan. Pt reports having a handful of pills last night.

## 2014-09-09 NOTE — ED Notes (Signed)
Patient states that she has been isolating herself lately.  She does not want her family given any updates on how she is doing.  States "I have no support at home, they are think I'm a joke".

## 2014-09-09 NOTE — ED Notes (Signed)
Pt sleeping, sitter at bedside

## 2014-09-09 NOTE — ED Provider Notes (Signed)
CSN: [id number]     Arrival date & time 09/09/14  1307 History   First MD Initiated Contact with Patient 09/09/14 1319     Chief Complaint  Patient presents with  . V70.1     (Consider location/radiation/quality/duration/timing/severity/associated sxs/prior Treatment) HPI Comments: patient reports suicidal thoughts with plan to overdose on pills since last night. She called mobile crisis line and was brought to the ED by police. She denies actually overdosing on pills without about it. She is hearing voices that are telling her to hurt herself. She last uses crack about 2 weeks ago. She has been not been taking her bipolar medications for several days. She endorses wanted her to members of her family but will not give specifics. She is a diabetic and is insulin-dependent. She denies any excessive alcohol use. She denies any IV drug abuse. She denies any chest pain, abdominal pain, back pain, fever or chills.  The history is provided by the patient and the police.    Past Medical History  Diagnosis Date  . Anxiety   . Post traumatic stress disorder   . Schizoaffective disorder, bipolar type   . Asthma   . Headache(784.0)   . Cocaine-induced mental disorder     psychosis  . 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 Seizures 01/03/2013    Most recent 12/31/2012, per pt report.  Marland Kitchen. COPD (chronic obstructive pulmonary disease)   . Neuropathy    History reviewed. No pertinent past surgical history. Family History  Problem Relation Age of Onset  . Diabetes Mother   . Breast cancer Mother   . Emphysema Maternal Uncle     smoked   History  Substance Use Topics  . Smoking status: Current Every Day Smoker -- 1.00 packs/day for 30 years    Types: Cigarettes    Last Attempt to Quit: 01/03/2013  . Smokeless tobacco: Never Used  . Alcohol Use: No     Comment: Pt denies   OB History    Gravida Para Term Preterm AB TAB SAB Ectopic Multiple Living   1 1 1       1      Review of  Systems  Constitutional: Negative for fever, activity change and appetite change.  HENT: Negative for congestion and rhinorrhea.   Respiratory: Negative for cough, chest tightness and shortness of breath.   Cardiovascular: Negative for chest pain.  Gastrointestinal: Negative for nausea, vomiting and abdominal pain.  Genitourinary: Negative for dysuria and hematuria.  Musculoskeletal: Negative for myalgias and arthralgias.  Skin: Negative for rash.  Neurological: Negative for dizziness, weakness and headaches.  Psychiatric/Behavioral: Positive for suicidal ideas, self-injury, dysphoric mood, decreased concentration and agitation. Negative for behavioral problems. The patient is nervous/anxious.   A complete 10 system review of systems was obtained and all systems are negative except as noted in the HPI and PMH.      Allergies  Peach flavor and Penicillins  Home Medications   Prior to Admission medications   Medication Sig Start Date End Date Taking? Authorizing Provider  albuterol (PROVENTIL HFA;VENTOLIN HFA) 108 (90 BASE) MCG/ACT inhaler Inhale 2 puffs into the lungs every 6 (six) hours as needed for wheezing or shortness of breath.   Yes Historical Provider, MD  albuterol (PROVENTIL) (2.5 MG/3ML) 0.083% nebulizer solution Take 2.5 mg by nebulization every 6 (six) hours as needed for wheezing or shortness of breath.   Yes Historical Provider, MD  benazepril-hydrochlorthiazide (LOTENSIN HCT) 20-12.5 MG per tablet Take 1 tablet  by mouth daily.   Yes Historical Provider, MD  clonazePAM (KLONOPIN) 1 MG tablet Take 1 mg by mouth 3 (three) times daily as needed for anxiety.   Yes Historical Provider, MD  gabapentin (NEURONTIN) 300 MG capsule Take 300 mg by mouth 3 (three) times daily.   Yes Historical Provider, MD  insulin aspart (NOVOLOG) 100 UNIT/ML injection Inject 0-17 Units into the skin 3 (three) times daily with meals. For hyperglycemia. 01/28/13  Yes Lloyd Huger T Mashburn, PA-C  QUEtiapine  (SEROQUEL) 400 MG tablet Take 400 mg by mouth at bedtime.    Yes Historical Provider, MD  risperiDONE (RISPERDAL) 1 MG tablet Take 1 mg by mouth 2 (two) times daily.   Yes Historical Provider, MD  divalproex (DEPAKOTE ER) 500 MG 24 hr tablet Take 1 tablet (500 mg total) by mouth 2 (two) times daily. For mood stabilization. Patient not taking: Reported on 09/09/2014 01/28/13   Tamala Julian, PA-C  famotidine (PEPCID) 20 MG tablet One at bedtime Patient not taking: Reported on 09/09/2014 03/29/13   Nyoka Cowden, MD  pantoprazole (PROTONIX) 40 MG tablet Take 1 tablet (40 mg total) by mouth daily. Take 30-60 min before first meal of the day Patient not taking: Reported on 09/09/2014 03/29/13   Nyoka Cowden, MD   BP 134/91 mmHg  Pulse 93  Temp(Src) 97.1 F (36.2 C) (Oral)  Resp 20  Ht  (1.575 m)  Wt 204 lb (92.534 kg)  BMI 37.30 kg/m2  SpO2 99%  LMP 12/27/2012 Physical Exam  Constitutional: She is oriented to person, place, and time. She appears well-developed and well-nourished. No distress.  Tearful, flat affect  HENT:  Head: Normocephalic and atraumatic.  Mouth/Throat: Oropharynx is clear and moist. No oropharyngeal exudate.  Eyes: Conjunctivae and EOM are normal. Pupils are equal, round, and reactive to light.  Neck: Normal range of motion. Neck supple.  No meningismus.  Cardiovascular: Normal rate, regular rhythm, normal heart sounds and intact distal pulses.   No murmur heard. Pulmonary/Chest: Effort normal and breath sounds normal. No respiratory distress.  Abdominal: Soft. There is no tenderness. There is no rebound and no guarding.  Musculoskeletal: Normal range of motion. She exhibits no edema or tenderness.  Neurological: She is alert and oriented to person, place, and time. No cranial nerve deficit. She exhibits normal muscle tone. Coordination normal.  No ataxia on finger to nose bilaterally. No pronator drift. 5/5 strength throughout. CN 2-12 intact. Negative Romberg.  Equal grip strength. Sensation intact. Gait is normal.   Skin: Skin is warm.  Psychiatric: She has a normal mood and affect. Her behavior is normal.  Nursing note and vitals reviewed.   ED Course  Procedures (including critical care time) Labs Review Labs Reviewed  CBC WITH DIFFERENTIAL/PLATELET - Abnormal; Notable for the following:    RBC 5.62 (*)    MCV 69.2 (*)    MCH 24.0 (*)    RDW 17.2 (*)    All other components within normal limits  ACETAMINOPHEN LEVEL - Abnormal; Notable for the following:    Acetaminophen (Tylenol), Serum <10 (*)    All other components within normal limits  VALPROIC ACID LEVEL - Abnormal; Notable for the following:    Valproic Acid Lvl <10 (*)    All other components within normal limits  COMPREHENSIVE METABOLIC PANEL  ETHANOL  SALICYLATE LEVEL  URINE RAPID DRUG SCREEN (HOSP PERFORMED)  PREGNANCY, URINE  URINALYSIS, ROUTINE W REFLEX MICROSCOPIC  CBG MONITORING, ED    Imaging  Review No results found.   EKG Interpretation None      MDM   Final diagnoses:  Suicidal ideation   Suicidal thoughts with plan to overdose and vague homicidal thoughts. Calm and Cooperative at this time. Screening labs, discuss with psychiatry.  Screening labs unremarkable.  Holding orders placed.  Patient is medically clear for psychiatric placement.    Glynn OctaveStephen Cici Rodriges, MD 09/09/14 [phone]

## 2014-09-09 NOTE — ED Notes (Signed)
Telepsych in progress. 

## 2014-09-09 NOTE — Progress Notes (Addendum)
Pt referral faxed to the following facilities who report they are accepting referrals or have bed availability:  [facility]  Will continue seeking placement.  Chad CordialLauren Carter, LCSWA 09/09/2014 3:23 PM

## 2014-09-10 ENCOUNTER — Inpatient Hospital Stay (HOSPITAL_COMMUNITY)
Admission: AD | Admit: 2014-09-10 | Discharge: 2014-09-13 | DRG: 885 | Disposition: A | Discharge status: Home / Self Care | Payer: MEDICAID | Source: Intra-hospital | Attending: Psychiatry | Admitting: Psychiatry

## 2014-09-10 DIAGNOSIS — R45851 Suicidal ideations: Secondary | ICD-10-CM | POA: Diagnosis present

## 2014-09-10 DIAGNOSIS — IMO0002 Reserved for concepts with insufficient information to code with codable children: Secondary | ICD-10-CM

## 2014-09-10 DIAGNOSIS — F411 Generalized anxiety disorder: Secondary | ICD-10-CM | POA: Diagnosis present

## 2014-09-10 DIAGNOSIS — F4310 Post-traumatic stress disorder, unspecified: Secondary | ICD-10-CM | POA: Diagnosis present

## 2014-09-10 DIAGNOSIS — Z794 Long term (current) use of insulin: Secondary | ICD-10-CM | POA: Diagnosis not present

## 2014-09-10 DIAGNOSIS — G4700 Insomnia, unspecified: Secondary | ICD-10-CM | POA: Diagnosis present

## 2014-09-10 DIAGNOSIS — J449 Chronic obstructive pulmonary disease, unspecified: Secondary | ICD-10-CM | POA: Diagnosis present

## 2014-09-10 DIAGNOSIS — K219 Gastro-esophageal reflux disease without esophagitis: Secondary | ICD-10-CM | POA: Diagnosis present

## 2014-09-10 DIAGNOSIS — F410 Panic disorder [episodic paroxysmal anxiety] without agoraphobia: Secondary | ICD-10-CM | POA: Diagnosis present

## 2014-09-10 DIAGNOSIS — F1420 Cocaine dependence, uncomplicated: Secondary | ICD-10-CM | POA: Diagnosis present

## 2014-09-10 DIAGNOSIS — E119 Type 2 diabetes mellitus without complications: Secondary | ICD-10-CM | POA: Diagnosis present

## 2014-09-10 DIAGNOSIS — Z833 Family history of diabetes mellitus: Secondary | ICD-10-CM

## 2014-09-10 DIAGNOSIS — E1165 Type 2 diabetes mellitus with hyperglycemia: Secondary | ICD-10-CM

## 2014-09-10 DIAGNOSIS — J45909 Unspecified asthma, uncomplicated: Secondary | ICD-10-CM | POA: Diagnosis present

## 2014-09-10 DIAGNOSIS — G629 Polyneuropathy, unspecified: Secondary | ICD-10-CM | POA: Diagnosis present

## 2014-09-10 DIAGNOSIS — F250 Schizoaffective disorder, bipolar type: Principal | ICD-10-CM | POA: Diagnosis present

## 2014-09-10 LAB — CBG MONITORING, ED
GLUCOSE-CAPILLARY: 89 mg/dL (ref 70–99)
Glucose-Capillary: 101 mg/dL — ABNORMAL HIGH (ref 70–99)
Glucose-Capillary: 112 mg/dL — ABNORMAL HIGH (ref 70–99)

## 2014-09-10 MED ORDER — HYDROXYZINE HCL 25 MG PO TABS
25.0000 mg | ORAL_TABLET | Freq: Three times a day (TID) | ORAL | Status: DC | PRN
  Administered 2014-09-11: 25 mg via ORAL
  Filled 2014-09-10: qty 6
  Filled 2014-09-10: qty 1

## 2014-09-10 MED ORDER — ALBUTEROL SULFATE HFA 108 (90 BASE) MCG/ACT IN AERS
2.0000 | INHALATION_SPRAY | Freq: Four times a day (QID) | RESPIRATORY_TRACT | Status: DC | PRN
  Administered 2014-09-12: 2 via RESPIRATORY_TRACT
  Filled 2014-09-10: qty 6.7

## 2014-09-10 MED ORDER — RISPERIDONE 1 MG PO TABS: 1.0000 mg | ORAL_TABLET | Freq: Two times a day (BID) | ORAL | Status: DC

## 2014-09-10 MED ORDER — ALBUTEROL SULFATE (2.5 MG/3ML) 0.083% IN NEBU: 2.5000 mg | INHALATION_SOLUTION | Freq: Four times a day (QID) | RESPIRATORY_TRACT | Status: DC | PRN

## 2014-09-10 MED ORDER — QUETIAPINE FUMARATE 400 MG PO TABS
400.0000 mg | ORAL_TABLET | Freq: Every day | ORAL | Status: DC
  Administered 2014-09-10: 400 mg via ORAL
  Filled 2014-09-10: qty 2
  Filled 2014-09-10 (×2): qty 1

## 2014-09-10 MED ORDER — RISPERIDONE 1 MG PO TABS
1.0000 mg | ORAL_TABLET | Freq: Two times a day (BID) | ORAL | Status: DC
  Administered 2014-09-11: 1 mg via ORAL
  Filled 2014-09-10 (×3): qty 1

## 2014-09-10 MED ORDER — MAGNESIUM HYDROXIDE 400 MG/5ML PO SUSP: 30.0000 mL | Freq: Every day | ORAL | Status: DC | PRN

## 2014-09-10 MED ORDER — BENAZEPRIL-HYDROCHLOROTHIAZIDE 20-12.5 MG PO TABS: 1.0000 | ORAL_TABLET | Freq: Every day | ORAL | Status: DC

## 2014-09-10 MED ORDER — BENAZEPRIL HCL 10 MG PO TABS
20.0000 mg | ORAL_TABLET | Freq: Every day | ORAL | Status: DC
  Administered 2014-09-11 – 2014-09-13 (×3): 20 mg via ORAL
  Filled 2014-09-10 (×2): qty 2
  Filled 2014-09-10 (×2): qty 1
  Filled 2014-09-10 (×2): qty 2

## 2014-09-10 MED ORDER — ACETAMINOPHEN 325 MG PO TABS
650.0000 mg | ORAL_TABLET | Freq: Four times a day (QID) | ORAL | Status: DC | PRN
  Administered 2014-09-11: 650 mg via ORAL
  Filled 2014-09-10: qty 2

## 2014-09-10 MED ORDER — HYDROCHLOROTHIAZIDE 12.5 MG PO CAPS
12.5000 mg | ORAL_CAPSULE | Freq: Every day | ORAL | Status: DC
  Administered 2014-09-11 – 2014-09-13 (×3): 12.5 mg via ORAL
  Filled 2014-09-10 (×5): qty 1

## 2014-09-10 MED ORDER — GABAPENTIN 300 MG PO CAPS
300.0000 mg | ORAL_CAPSULE | Freq: Three times a day (TID) | ORAL | Status: DC
  Administered 2014-09-10 – 2014-09-13 (×8): 300 mg via ORAL
  Filled 2014-09-10 (×4): qty 1
  Filled 2014-09-10: qty 9
  Filled 2014-09-10 (×6): qty 1
  Filled 2014-09-10: qty 9
  Filled 2014-09-10 (×2): qty 1
  Filled 2014-09-10: qty 9

## 2014-09-10 MED ORDER — ALUM & MAG HYDROXIDE-SIMETH 200-200-20 MG/5ML PO SUSP: 30.0000 mL | ORAL | Status: DC | PRN

## 2014-09-10 MED ORDER — INSULIN ASPART 100 UNIT/ML ~~LOC~~ SOLN: 0.0000 [IU] | Freq: Three times a day (TID) | SUBCUTANEOUS | Status: DC

## 2014-09-10 NOTE — BH Assessment (Signed)
Reassessed the Patient per her request.  Patient was upset and agitated because she has been in [facility] for "3 days" and placement has been found for other Patients, "but not me."  Patient denied current SI, HI, VH, and AH.  She reports experiencing "depression and anxiety."  Patient reports current medication regimen is beneficial and acknowledged not being medication compliant as the reason for current hospitalization.  She reports wanting to be assessed by a Psychiatrist "so I can get out of here..[facility]  Patient also reports wanting to go home and that her Caregiver returns to work tomorrow.

## 2014-09-10 NOTE — ED Notes (Signed)
In custody of [employer], all belongings with pt. No issues. Notified [facility] of pt discharge from facility.

## 2014-09-10 NOTE — ED Notes (Signed)
Pt upset, yelling and cursing. [employer] at bedside. Pt requests to see EDP. EDP notified.

## 2014-09-10 NOTE — ED Notes (Signed)
Dr. James at bedside  

## 2014-09-10 NOTE — ED Notes (Signed)
[facility] in [facility]. RN at bedside. Pt states to RN and counselor she wants to speak with psychiatrist. RN states to both she will place consult order in EPIC. Order placed and [facility]ary [facility].

## 2014-09-10 NOTE — ED Notes (Signed)
Patient resting, no distress noted.

## 2014-09-10 NOTE — ED Provider Notes (Signed)
I have met and spoken with patient. She was becoming frustrated wondering "what's taking so long". I discussed with her her IVC. She had initially stated that she didn't understand why she was here "involuntary" when she had initially called for help herself. I explained to her that her explanation that she was considering taking a whole handful of pills was means for the initiation of her IVC. I agree with this. She has been recommended for inpatient by behavioral health team, however accepting facility has not yet been located. Discussed this with her at length. Did tell her that it can be several hours more, or even greater than 24 hours to find appropriate facility considering her comorbid conditions.  She was calm and expressed her gratitude for the explanation. In expresses understanding of the IVC, and awaiting inpatient treatment.  Tanna Furry, MD 09/10/14 [phone]

## 2014-09-10 NOTE — ED Notes (Signed)
Pt fiance at bedside this am. Pt updated she is still pending placement at inpatient facility. [facility], [facility].

## 2014-09-10 NOTE — ED Notes (Signed)
[facility], all belongings with patient.

## 2014-09-10 NOTE — Progress Notes (Signed)
This NP reviewed pt's chart including [facility] in the past 3 years, with suicidal ideation/attempts for 4 of these. Additionally, pt was admitted to the medical floor for a suicidal attempt within the past 2 years. Pt called for help this time and reported her current suicide attempt. Later, she asked to leave. However, given pt's severe psychiatric history of schizoaffective bipolar depression, labile mood, inconsistent statements in the ED, and the current TTS evaluation where pt admitted to cutting herself (she did this time) and attempting to take an entire handful of pills (same as prior means of attempts), pt cannot be discharged.  Due to her actions and severe risk, pt continues to meet inpatient psychiatric hospitalization criteria in spite of any subjective statement she may make at this time.  Case reviewed with Dr. Jama Flavorsobos, in agreement with plan and the high risk of this particular patient. This NP called Dr. Adriana Simasook to inform him of [facility] and Dr. Adriana Simasook also in agreement of high risk and necessity for admission at this time; another evaluation 24 hours later would not alter the course of seeking placement.  Beau FannyWithrow, John C, FNP-BC 09/10/2014      04:16 PM  Agree with NP Progress Note  Nehemiah MassedFernando Kelleen Stolze, MD

## 2014-09-10 NOTE — ED Notes (Signed)
RN [facility] [phone]. Pt requested to speak with psychiatry, consult with psychiatrist order placed in EPIC, Lauren notified.

## 2014-09-11 ENCOUNTER — Other Ambulatory Visit: Payer: Self-pay

## 2014-09-11 DIAGNOSIS — R45851 Suicidal ideations: Secondary | ICD-10-CM

## 2014-09-11 DIAGNOSIS — F1420 Cocaine dependence, uncomplicated: Secondary | ICD-10-CM | POA: Diagnosis present

## 2014-09-11 DIAGNOSIS — F250 Schizoaffective disorder, bipolar type: Principal | ICD-10-CM

## 2014-09-11 LAB — GLUCOSE, CAPILLARY
Glucose-Capillary: 175 mg/dL — ABNORMAL HIGH (ref 70–99)
Glucose-Capillary: 119 mg/dL — ABNORMAL HIGH (ref 70–99)
Glucose-Capillary: 247 mg/dL — ABNORMAL HIGH (ref 70–99)
Glucose-Capillary: 200 mg/dL — ABNORMAL HIGH (ref 70–99)

## 2014-09-11 MED ORDER — INSULIN ASPART 100 UNIT/ML ~~LOC~~ SOLN
0.0000 [IU] | Freq: Three times a day (TID) | SUBCUTANEOUS | Status: DC
  Administered 2014-09-11: 5 [IU] via SUBCUTANEOUS

## 2014-09-11 MED ORDER — INSULIN ASPART 100 UNIT/ML ~~LOC~~ SOLN
0.0000 [IU] | Freq: Three times a day (TID) | SUBCUTANEOUS | Status: DC
  Administered 2014-09-12: 15 [IU] via SUBCUTANEOUS
  Administered 2014-09-12 (×2): 7 [IU] via SUBCUTANEOUS
  Administered 2014-09-13: 11 [IU] via SUBCUTANEOUS

## 2014-09-11 MED ORDER — ZIPRASIDONE HCL 20 MG PO CAPS
20.0000 mg | ORAL_CAPSULE | Freq: Two times a day (BID) | ORAL | Status: DC
  Administered 2014-09-11 – 2014-09-13 (×4): 20 mg via ORAL
  Filled 2014-09-11 (×4): qty 1
  Filled 2014-09-11 (×2): qty 6
  Filled 2014-09-11 (×2): qty 1

## 2014-09-11 MED ORDER — INSULIN ASPART 100 UNIT/ML ~~LOC~~ SOLN: 0.0000 [IU] | Freq: Three times a day (TID) | SUBCUTANEOUS | Status: DC

## 2014-09-11 MED ORDER — QUETIAPINE FUMARATE 200 MG PO TABS
200.0000 mg | ORAL_TABLET | Freq: Every day | ORAL | Status: DC
  Administered 2014-09-11 – 2014-09-12 (×2): 200 mg via ORAL
  Filled 2014-09-11: qty 3
  Filled 2014-09-11 (×4): qty 1

## 2014-09-11 MED ORDER — INSULIN ASPART 100 UNIT/ML ~~LOC~~ SOLN: 0.0000 [IU] | Freq: Every day | SUBCUTANEOUS | Status: DC

## 2014-09-11 MED ORDER — CLONAZEPAM 0.5 MG PO TABS
0.5000 mg | ORAL_TABLET | Freq: Three times a day (TID) | ORAL | Status: DC | PRN
Start: 2014-09-11 — End: 2014-09-13
  Administered 2014-09-11 – 2014-09-12 (×3): 0.5 mg via ORAL
  Filled 2014-09-11 (×3): qty 1

## 2014-09-11 MED ORDER — NICOTINE 21 MG/24HR TD PT24
MEDICATED_PATCH | TRANSDERMAL | Status: AC
  Filled 2014-09-11: qty 1

## 2014-09-11 MED ORDER — NICOTINE 21 MG/24HR TD PT24
21.0000 mg | MEDICATED_PATCH | Freq: Every day | TRANSDERMAL | Status: DC
  Administered 2014-09-11 – 2014-09-13 (×2): 21 mg via TRANSDERMAL
  Filled 2014-09-11 (×2): qty 1
  Filled 2014-09-11: qty 14
  Filled 2014-09-11 (×2): qty 1

## 2014-09-11 MED ORDER — INSULIN ASPART 100 UNIT/ML ~~LOC~~ SOLN
4.0000 [IU] | Freq: Three times a day (TID) | SUBCUTANEOUS | Status: DC
  Administered 2014-09-11 – 2014-09-13 (×6): 4 [IU] via SUBCUTANEOUS

## 2014-09-11 MED ORDER — INSULIN ASPART 100 UNIT/ML ~~LOC~~ SOLN
0.0000 [IU] | Freq: Every day | SUBCUTANEOUS | Status: DC
  Administered 2014-09-12: 4 [IU] via SUBCUTANEOUS

## 2014-09-11 NOTE — Progress Notes (Signed)
Patient ID: Sylvester HarderKaren C Celestin, female   DOB: Jan 31, 1966, 49 y.o.   MRN: [id number]  DAR: Pt. Denies SI/HI and A/V Hallucinations. Patient did have a headache this morning and received Tylenol which upon reassessment patient was sleeping. Support and encouragement provided to the patient. Scheduled medications administered to patient this morning however patient did not take her 1200 Neurontin. Patient did not meet order parameters according to the ordered scale and thus did not received insulin. Patient came to the window irritable and patient stated, "according to my doctor I get 2 units." Writer explained to patient the current order and patient stormed off. MD and NP were notified of this and about patient stating she is currently not on the correct sliding scale. Patient is guarded and irritable today as for interactions with this Clinical research associatewriter. Patient was initially seen in the milieu this morning however has remained in her bed sleeping. Q15 minute checks are maintained for safety.

## 2014-09-11 NOTE — Progress Notes (Signed)
Attended wrap up group tonight. 

## 2014-09-11 NOTE — [facility] Group Notes (Signed)
[facility] Note   09/11/2014 11:10 AM  Participation Quality:  Invited.  Chose to not attend   Kathleen [name], Kathleen [name]

## 2014-09-11 NOTE — Progress Notes (Signed)
Patient ID: Kathleen [name], female   DOB: 09/22/1965, 49 y.o.   MRN: [id number] D: Client is irritable, loud, and demanding complains that her medications is not right. "I'm ready to get the hell out of here" "I anxious, I just did crack three days ago, I need something" "my daughter won't let me see my granddaughter" A: Writer introduced self to client, provided emotional support, reviewed medications, administered as prescribed. Encouraged client to wait to see doctor tomorrow about medication concerns. Staff will monitor q4715min for safety. R: Client is safe on the unit, attended group.

## 2014-09-11 NOTE — Plan of Care (Signed)
Problem: Alteration in thought process Goal: STG-Patient is able to follow short directions Outcome: Progressing Patient is ABLE to follow directions but chooses NOT to at times.

## 2014-09-11 NOTE — Tx Team (Addendum)
Initial Interdisciplinary Treatment Plan   PATIENT STRESSORS: Health problems Medication change or noncompliance Substance abuse   PATIENT STRENGTHS: Ability for insight Average or above average intelligence Capable of independent living General fund of knowledge Supportive family/friends   PROBLEM LIST: Problem List/Patient Goals Date to be addressed Date deferred Reason deferred Estimated date of resolution  Suicidal ideation 09/10/2014     depression 09/10/2014     Medication management 09/10/2014     psychosis 09/10/2014     "I want help staying calm" 09/10/2014                              DISCHARGE CRITERIA:  Improved stabilization in mood, thinking, and/or behavior Need for constant or close observation no longer present  PRELIMINARY DISCHARGE PLAN: Return to previous living arrangement  PATIENT/FAMIILY INVOLVEMENT: This treatment plan has been presented to and reviewed with the patient, Sylvester HarderKaren C Septer, and/or family member, The patient and family have been given the opportunity to ask questions and make suggestions.  JEHU-APPIAH, Janard Culp K 09/11/2014, 12:36 AM

## 2014-09-11 NOTE — Progress Notes (Signed)
Patient ID: Sylvester HarderKaren C Rafter, female   DOB: 1965-07-05, 49 y.o.   MRN: [id number] Admission note: D: Patient is a Involuntary admission in no acute distress for depression, suicidal ideation to cut wrist. Pt reports her current medication has not been effective. Pt stated "needs something stronger" to help with staying calm. Pt reports she has 16 personalities. Pt reports she relapsed on cocaine 2 days ago after 10 months of sobriety. Pt has hx of diabetics, htn, copd, seizures, and anxiety. Pt denies SI/HI on admission. Endorses passive AVH hallucination without command.   A: Pt admitted to unit per protocol, skin assessment and belonging search done. No skin issues noted. Consent signed by pt. Pt educated on therapeutic milieu rules. Pt was introduced to milieu by nursing staff. Fall risk safety plan explained to the patient. 15 minutes checks started for safety.   R: Pt was receptive to education. Writer offered support.

## 2014-09-11 NOTE — Progress Notes (Signed)
Inpatient Diabetes Program Recommendations  AACE/ADA: New Consensus Statement on Inpatient Glycemic Control (2013)  Target Ranges:  Prepandial:   less than 140 mg/dL      Peak postprandial:   less than 180 mg/dL (1-2 hours)      Critically ill patients:  140 - 180 mg/dL    Results for Kathleen [name], [name] (MRN [id number]) as of 09/11/2014 14:06  Ref. Range 09/10/2014 07:44 09/10/2014 11:52 09/10/2014 17:07  Glucose-Capillary Latest Ref Range: 70-99 mg/dL 89 409101 (H) 811112 (H)    Results for Kathleen [name], Kathleen [name] (MRN [id number]) as of 09/11/2014 14:06  Ref. Range 09/11/2014 06:09 09/11/2014 11:52  Glucose-Capillary Latest Ref Range: 70-99 mg/dL 213175 (H) 086119 (H)     Admit with: Schizophrenia/ Depression/ Suicidal Thoughts/ Positive for Cocaine  History: DM, HTN, COPD, Drug Abuse  Home DM Meds: Novolog 0-17 units tid per SSI  Current DM Orders: Novolog Moderate SSI tid ac + HS    **Patient's CBGs well controlled thus far.  **Note Novolog Moderate SSI started today at 12PM.    Will follow and assist as needed Ambrose FinlandJeannine Johnston Arvind Mexicano RN, MSN, CDE Diabetes Coordinator Inpatient Diabetes Program Team Pager: [phone] (8a-5p)
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
## 2014-09-11 NOTE — [facility] Group Notes (Signed)
[facility] LCSW Group Therapy  09/11/2014 1:15 pm  Type of Therapy: Process Group Therapy  Participation Level:  Was in group room dailing a phone number when I came in to start group.  I requested she wait until after group to call and invited her to stay for group.  She left without a word.  Summary of Progress/Problems: Today's group addressed the issue of overcoming obstacles.  Patients were asked to identify their biggest obstacle post d/c that stands in the way of their on-going success, and then problem solve as to how to manage this.  Daryel Geraldorth, Javar Eshbach B 09/11/2014   3:24 PM
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
## 2014-09-12 LAB — GLUCOSE, CAPILLARY
GLUCOSE-CAPILLARY: 238 mg/dL — AB (ref 70–99)
GLUCOSE-CAPILLARY: 331 mg/dL — AB (ref 70–99)
Glucose-Capillary: 238 mg/dL — ABNORMAL HIGH (ref 70–99)
Glucose-Capillary: 339 mg/dL — ABNORMAL HIGH (ref 70–99)
Glucose-Capillary: 236 mg/dL — ABNORMAL HIGH (ref 70–99)

## 2014-09-12 LAB — LIPID PANEL
CHOL/HDL RATIO: 4.7 ratio
CHOLESTEROL: 173 mg/dL (ref 0–200)
HDL: 37 mg/dL — AB (ref >40)
LDL Cholesterol: 98 mg/dL (ref 0–99)
Triglycerides: 189 mg/dL — ABNORMAL HIGH (ref <150)
VLDL: 38 mg/dL (ref 0–40)

## 2014-09-12 LAB — TSH: TSH: 3.301 u[IU]/mL (ref 0.350–4.500)

## 2014-09-12 MED ORDER — LITHIUM CARBONATE ER 300 MG PO TBCR
300.0000 mg | EXTENDED_RELEASE_TABLET | Freq: Two times a day (BID) | ORAL | Status: DC
  Administered 2014-09-12 – 2014-09-13 (×3): 300 mg via ORAL
  Filled 2014-09-12: qty 1
  Filled 2014-09-12: qty 6
  Filled 2014-09-12: qty 1
  Filled 2014-09-12: qty 6
  Filled 2014-09-12 (×3): qty 1

## 2014-09-12 NOTE — Progress Notes (Signed)
[facility] MD Progress Note  09/12/2014 10:27 AM Kathleen [name]  MRN:  [id number] Subjective:  Kathleen [name] states that she is looking forward to getting started on the Lithium. She got her EKG and her blood taken. She states the medications she is taking now seem to be starting to work for her. She states she needs to be out of here by Thursday as she has to take her mother to chemo. She shares that she has breast cancer. She states she does not feel her outpatient psychiatrist listens to her and she would like to work with an ACT team Principal Problem: Schizoaffective disorder, bipolar type Diagnosis:   Patient Active Problem List   Diagnosis Date Noted  . PTSD (post-traumatic stress disorder) [F43.10] 06/24/2011    Priority: Medium    Class: Chronic  . Cocaine use disorder, moderate, dependence [F14.20] 09/11/2014  . Schizoaffective disorder, bipolar type [F25.0] 09/10/2014  . HBP (high blood pressure) [I10] 03/29/2013  . Insulin dependent type 2 diabetes mellitus, uncontrolled [E11.65, Z79.4] 01/06/2013   Total Time spent with patient: 30 minutes   Past Medical History:  Past Medical History  Diagnosis Date  . Anxiety   . Post traumatic stress disorder   . Schizoaffective disorder, bipolar type   . Asthma   . Headache(784.0)   . Cocaine-induced mental disorder     psychosis  . 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 Seizures 01/03/2013    Most recent 12/31/2012, per pt report.  Marland Kitchen COPD (chronic obstructive pulmonary disease)   . Neuropathy    No past surgical history on file. Family History:  Family History  Problem Relation Age of Onset  . Diabetes Mother   . Breast cancer Mother   . Emphysema Maternal Uncle     smoked   Social History:  History  Alcohol Use No    Comment: Pt denies     History  Drug Use  . Yes  . Special: "Crack" cocaine, Cocaine    Comment: crack use last month    History   Social History  . Marital Status: Divorced    Spouse Name: N/A   . Number of Children: N/A  . Years of Education: N/A   Occupational History  . Unemployed    Social History Main Topics  . Smoking status: Current Every Day Smoker -- 1.00 packs/day for 30 years    Types: Cigarettes    Last Attempt to Quit: 01/03/2013  . Smokeless tobacco: Never Used  . Alcohol Use: No     Comment: Pt denies  . Drug Use: Yes    Special: "Crack" cocaine, Cocaine     Comment: crack use last month  . Sexual Activity: No   Other Topics Concern  . Not on file   Social History Narrative   Additional History:    Sleep: Fair  Appetite:  Fair   Assessment:   Musculoskeletal: Strength & Muscle Tone: within normal limits Gait & Station: normal Patient leans: N/A   Psychiatric Specialty Exam: Physical Exam  Review of Systems  Constitutional: Positive for weight loss.  HENT: Negative.   Eyes: Negative.   Respiratory: Negative.   Cardiovascular: Negative.   Gastrointestinal: Negative.   Genitourinary: Negative.   Musculoskeletal: Negative.   Skin: Negative.   Neurological: Negative.   Endo/Heme/Allergies: Negative.   Psychiatric/Behavioral: Positive for depression, suicidal ideas and substance abuse. The patient is nervous/anxious.     Blood pressure 111/70, pulse 118, temperature 98.3 F (36.8 C), temperature  source Oral, resp. rate 16, height 5\' 3"  (1.6 m), weight 91.173 kg (201 lb), last menstrual period 12/27/2012, SpO2 99 %.Body mass index is 35.61 kg/(m^2).  General Appearance: Fairly Groomed  Patent attorneyye Contact::  Fair  Speech:  Clear and Coherent and Pressured  Volume:  fluctuates  Mood:  Anxious  Affect:  Labile  Thought Process:  Coherent and Goal Directed  Orientation:  Full (Time, Place, and Person)  Thought Content:  symptoms events worries concerns  Suicidal Thoughts:  No  Homicidal Thoughts:  No  Memory:  Immediate;   Fair Recent;   Fair Remote;   Fair  Judgement:  Fair  Insight:  Present  Psychomotor Activity:  Restlessness   Concentration:  Fair  Recall:  FiservFair  Fund of Knowledge:Fair  Language: Fair  Akathisia:  No  Handed:  Right  AIMS (if indicated):     Assets:  Desire for Improvement Housing Social Support  ADL's:  Intact  Cognition: WNL  Sleep:  Number of Hours: 5.5     Current Medications: Current Facility-Administered Medications  Medication Dose Route Frequency Provider Last Rate Last Dose  . acetaminophen (TYLENOL) tablet 650 mg  650 mg Oral Q6H PRN Worthy FlankIjeoma E Nwaeze, NP   650 mg at 09/11/14 0831  . albuterol (PROVENTIL HFA;VENTOLIN HFA) 108 (90 BASE) MCG/ACT inhaler 2 puff  2 puff Inhalation Q6H PRN Worthy FlankIjeoma E Nwaeze, NP      . albuterol (PROVENTIL) (2.5 MG/3ML) 0.083% nebulizer solution 2.5 mg  2.5 mg Nebulization Q6H PRN Worthy FlankIjeoma E Nwaeze, NP      . alum & mag hydroxide-simeth (MAALOX/MYLANTA) 200-200-20 MG/5ML suspension 30 mL  30 mL Oral Q4H PRN Worthy FlankIjeoma E Nwaeze, NP      . benazepril (LOTENSIN) tablet 20 mg  20 mg Oral Daily Rachael FeeIrving A Draylon Mercadel, MD   20 mg at 09/12/14 [id number]   And  . hydrochlorothiazide (MICROZIDE) capsule 12.5 mg  12.5 mg Oral Daily Rachael FeeIrving A Seng Fouts, MD   12.5 mg at 09/12/14 [id number]  . clonazePAM (KLONOPIN) tablet 0.5 mg  0.5 mg Oral TID PRN Jomarie LongsSaramma Eappen, MD   0.5 mg at 09/11/14 1716  . gabapentin (NEURONTIN) capsule 300 mg  300 mg Oral TID Worthy FlankIjeoma E Nwaeze, NP   300 mg at 09/12/14 0837  . hydrOXYzine (ATARAX/VISTARIL) tablet 25 mg  25 mg Oral TID PRN Worthy FlankIjeoma E Nwaeze, NP   25 mg at 09/11/14 2133  . insulin aspart (novoLOG) injection 0-20 Units  0-20 Units Subcutaneous TID WC Kerry HoughSpencer E Simon, PA-C   7 Units at 09/12/14 0630  . insulin aspart (novoLOG) injection 0-5 Units  0-5 Units Subcutaneous QHS Kerry HoughSpencer E Simon, PA-C   0 Units at 09/11/14 2135  . insulin aspart (novoLOG) injection 4 Units  4 Units Subcutaneous TID WC Sanjuana KavaAgnes I Nwoko, NP   4 Units at 09/12/14 [phone] [id number]  . magnesium hydroxide (MILK OF MAGNESIA) suspension 30 mL  30 mL Oral Daily PRN Worthy FlankIjeoma E Nwaeze, NP      . nicotine (NICODERM CQ  - dosed in mg/24 hours) patch 21 mg  21 mg Transdermal Daily Jomarie LongsSaramma Eappen, MD   21 mg at 09/11/14 0834  . QUEtiapine (SEROQUEL) tablet 200 mg  200 mg Oral QHS Jomarie LongsSaramma Eappen, MD   200 mg at 09/11/14 2133  . ziprasidone (GEODON) capsule 20 mg  20 mg Oral BID WC Jomarie LongsSaramma Eappen, MD   20 mg at 09/12/14 [id number]    Lab Results:  Results for orders placed or performed during the hospital  encounter of 09/10/14 (from the past 48 hour(s))  Glucose, capillary     Status: Abnormal   Collection Time: 09/11/14  6:09 AM  Result Value Ref Range   Glucose-Capillary 175 (H) 70 - 99 mg/dL  Glucose, capillary     Status: Abnormal   Collection Time: 09/11/14 11:52 AM  Result Value Ref Range   Glucose-Capillary 119 (H) 70 - 99 mg/dL   Comment 1 Notify RN    Comment 2 Document in Chart   Glucose, capillary     Status: Abnormal   Collection Time: 09/11/14  5:09 PM  Result Value Ref Range   Glucose-Capillary 247 (H) 70 - 99 mg/dL   Comment 1 Notify RN    Comment 2 Document in Chart   Glucose, capillary     Status: Abnormal   Collection Time: 09/11/14  8:55 PM  Result Value Ref Range   Glucose-Capillary 200 (H) 70 - 99 mg/dL  Glucose, capillary     Status: Abnormal   Collection Time: 09/12/14  6:15 AM  Result Value Ref Range   Glucose-Capillary 238 (H) 70 - 99 mg/dL   Comment 1 Notify RN   TSH     Status: None   Collection Time: 09/12/14  6:35 AM  Result Value Ref Range   TSH 3.301 0.350 - 4.500 uIU/mL    Comment: Performed at [facility]  Lipid panel     Status: Abnormal   Collection Time: 09/12/14  6:35 AM  Result Value Ref Range   Cholesterol 173 0 - 200 mg/dL   Triglycerides 098 (H) <150 mg/dL   HDL 37 (L) >11 mg/dL   Total CHOL/HDL Ratio 4.7 RATIO   VLDL 38 0 - 40 mg/dL   LDL Cholesterol 98 0 - 99 mg/dL    Comment:        Total Cholesterol/HDL:CHD Risk Coronary Heart Disease Risk Table                     Men   Women  1/2 Average Risk   3.4   3.3  Average Risk        5.0   4.4  2 X Average Risk   9.6   7.1  3 X Average Risk  23.4   11.0        Use the calculated Patient Ratio above and the CHD Risk Table to determine the patient's CHD Risk.        ATP III CLASSIFICATION (LDL):  <100     mg/dL   Optimal  914-782  mg/dL   Near or Above                    Optimal  130-159  mg/dL   Borderline  956-213  mg/dL   High  >086     mg/dL   Very High Performed at [facility]     Physical Findings: AIMS: Facial and Oral Movements Muscles of Facial Expression: None, normal Lips and Perioral Area: None, normal Jaw: None, normal Tongue: None, normal,Extremity Movements Upper (arms, wrists, hands, fingers): None, normal Lower (legs, knees, ankles, toes): None, normal, Trunk Movements Neck, shoulders, hips: None, normal, Overall Severity Severity of abnormal movements (highest score from questions above): None, normal Incapacitation due to abnormal movements: None, normal Patient's awareness of abnormal movements (rate only patient's report): No Awareness, Dental Status Current problems with teeth and/or dentures?: No Does patient usually wear dentures?: No  CIWA:  COWS:     Treatment Plan Summary: Daily contact with patient to assess and evaluate symptoms and progress in treatment and Medication management Supportive approach/coping skills Mood instability; will start the Lithium as planned; Lithium carbonate 300 mg BID (med education provided) TSH, EKG WNL Substance abuse; will work a relapse prevention plan Will wean off the Klonopin Will continue the Geodon and the Seroquel and decide which one to keep Explore possibility of referring to an ACT team Medical Decision Making:  Review of Psycho-Social Stressors (1), Review or order clinical lab tests (1), Review of Medication Regimen & Side Effects (2) and Review of New Medication or Change in Dosage (2)     Lenell Lama A 09/12/2014, 10:27 AM

## 2014-09-12 NOTE — [facility] Suicide Risk Assessment (Signed)
[facility] INPATIENT:  Family/Significant Other Suicide Prevention Education  Suicide Prevention Education:  Patient Refusal for Family/Significant Other Suicide Prevention Education: The patient Sylvester HarderKaren C Cooperwood has refused to provide written consent for family/significant other to be provided Family/Significant Other Suicide Prevention Education during admission and/or prior to discharge.  Physician notified. SPE reviewed with patient and brochure provided. Patient encouraged to return to hospital if having suicidal thoughts, patient verbalized his/her understanding and has no further questions at this time.   Talyah Seder, [place] CarboKristin L 09/12/2014, 10:31 AM

## 2014-09-12 NOTE — Significant Event (Cosign Needed)
Patient with c/o of increasing dizziness over the last 48 hours. The patient is endorsing associated HA and  blurry vision, but is denying any CP symptoms. Patient with Hx of Dm, denying history of DKA, but has related paraesthesias. Patient is also endorsing polydipsia, polyuria and polyphasia concerns with last random BG 337 mg/dl. V/s note she is afebrile, normo - tensive and tachycardic with HR 117. [facility] and medical intervention.   Gilmore LarocheAkhtar MD on call

## 2014-09-12 NOTE — Progress Notes (Signed)
Patient ID: Kathleen HarderKaren C Burback, female   DOB: [birth year], 49 y.o.   MRN: [id number] D: Client refuse 2000 medications at first, then ask another nurse to administer, later client complains of blurred vision, notes that CBG at 331. A: Writer staffed with Dianna LimboS. Simon, PA [facility] Client called to make family aware, whom she says will be meeting her at the hospital. R: EMS notified, [facility]. Report Juanda Chanceracy RN.

## 2014-09-12 NOTE — Progress Notes (Signed)
Patient ID: Kathleen [name], female   DOB: 06/08/1965, 49 y.o.   MRN: [id number] D: EMS arrived,client irate cursing, demanding her pocket book before she left. A: Writer and AC explained that she was going to the ER to be seen due to her complaints. Client would not listen to reason, that her belongings are with security and would be released at time of her discharge. R: EMS, requested signature to witness client refusal to leave, signed by Bunnie Pionori, AC.

## 2014-09-12 NOTE — [facility] Group Notes (Signed)
Adult Psychoeducational Group Note  Date:  09/12/2014 Time:  9:00 AM  Group Topic/Focus:  Recovery Goals:   The focus of this group is to identify appropriate goals for recovery and establish a plan to achieve them.  Participation Level:  Did Not Attend  Additional Comments: Patient in bed resting during group.  Harold BarbanByrd, Ronecia E 09/12/2014, 5:16 PM

## 2014-09-12 NOTE — Progress Notes (Signed)
D: Patient presents with blunted affect and depressed, irritable mood. She declined completing the self inventory sheet today. Writer observes that the patient has been resting in bed the majority of the day, only rising for medications, snack time in the dayroom and scheduled meals. Patient has not attended any group sessions today. In compliance with the current medication regimen.  A: Support and encouragement provided to patient. Scheduled medications given per MD orders. Maintain Q15 minute checks for safety.  R: Patient receptive. Denies SI/HI and AVH. Patient remains safe on the hall.

## 2014-09-12 NOTE — [facility] Group Notes (Signed)
[facility] LCSW Group Therapy 09/12/2014  1:15 PM   Type of Therapy: Group Therapy  Participation Level: Did Not Attend. Patient in bed sleeping, invited to attend but declined.   Samuella BruinKristin Sulema Braid, MSW, [employer]CS[employer] Clinical Social Worker [facility] [phone]

## 2014-09-12 NOTE — Tx Team (Signed)
Interdisciplinary Treatment Plan Update (Adult) Date: 09/12/2014   Time Reviewed: 9:30 AM  Progress in Treatment: Attending groups: No Participating in groups: No Taking medication as prescribed: Yes Tolerating medication: Yes Family/Significant other contact made: No, CSW assessing for appropriate contracts Patient understands diagnosis: Yes Discussing patient identified problems/goals with staff: Yes Medical problems stabilized or resolved: Yes Denies suicidal/homicidal ideation: Yes Issues/concerns per patient self-inventory: Yes Other:  New problem(s) identified: N/A  Discharge Plan or Barriers:  5/10: CSW continuing to assess, patient new to milieu. [facility] [place]. 72 hour discharge request expires on Thursday 5/12.  Reason for Continuation of Hospitalization:  Depression Anxiety Medication Stabilization   Comments: N/A  Estimated length of stay: 2-3 days  For review of initial/current patient goals, please see plan of care.  Patient is a 49 year old female admitted with SI and depressive symptoms. [place]. Patient will benefit from crisis stabilization, medication evaluation, group therapy, and psycho education in addition to case management for discharge planning. Patient and CSW reviewed pt's identified goals and treatment plan. Pt verbalized understanding and agreed to treatment plan.  Attendees: Patient:    Family:    Physician: Dr. Jama Flavorsobos; Dr. Dub MikesLugo 09/12/2014 9:30 AM  Nursing: Harold Barbanonecia Byrd, Joslyn Devonaroline Beaudry, Merian CapronMarian Friedman, RN 09/12/2014 9:30 AM  Clinical Social Worker: Samuella BruinKristin Euan Wandler,  LCSWA 09/12/2014 9:30 AM  Other: Juline PatchQuylle Hodnett, LCSW 09/12/2014 9:30 AM  Other: Leisa LenzValerie Enoch, Vesta [facility] Liaison 09/12/2014 9:30 AM  Other: Onnie BoerJennifer Clark, Case Manager 09/12/2014 9:30 AM  Other: Mosetta AnisAggie Nwoko, Laura Davis, NP 09/12/2014 9:30 AM  Other:    Other:    Other:       Scribe for Treatment Team:   Samuella BruinKristin Laren Whaling, MSW, LCSWA [phone]

## 2014-09-13 LAB — HEMOGLOBIN A1C
HEMOGLOBIN A1C: 6.4 % — AB (ref 4.8–5.6)
MEAN PLASMA GLUCOSE: 137 mg/dL

## 2014-09-13 LAB — GLUCOSE, CAPILLARY
GLUCOSE-CAPILLARY: 252 mg/dL — AB (ref 70–99)
Glucose-Capillary: 100 mg/dL — ABNORMAL HIGH (ref 70–99)

## 2014-09-13 MED ORDER — HYDROCHLOROTHIAZIDE 12.5 MG PO CAPS: 12.5000 mg | ORAL_CAPSULE | Freq: Every day | ORAL | Status: DC

## 2014-09-13 MED ORDER — PANTOPRAZOLE SODIUM 40 MG PO TBEC: 40.0000 mg | DELAYED_RELEASE_TABLET | Freq: Every day | ORAL | Status: DC

## 2014-09-13 MED ORDER — INSULIN ASPART 100 UNIT/ML ~~LOC~~ SOLN: 0.0000 [IU] | Freq: Three times a day (TID) | SUBCUTANEOUS | Status: AC

## 2014-09-13 MED ORDER — NICOTINE 21 MG/24HR TD PT24: 21.0000 mg | MEDICATED_PATCH | Freq: Every day | TRANSDERMAL | Status: DC

## 2014-09-13 MED ORDER — FAMOTIDINE 20 MG PO TABS: ORAL_TABLET | ORAL | Status: DC

## 2014-09-13 MED ORDER — HYDROXYZINE HCL 25 MG PO TABS: 25.0000 mg | ORAL_TABLET | Freq: Three times a day (TID) | ORAL | Status: DC | PRN

## 2014-09-13 MED ORDER — GABAPENTIN 300 MG PO CAPS: 300.0000 mg | ORAL_CAPSULE | Freq: Three times a day (TID) | ORAL | Status: AC

## 2014-09-13 MED ORDER — ZIPRASIDONE HCL 20 MG PO CAPS: 20.0000 mg | ORAL_CAPSULE | Freq: Two times a day (BID) | ORAL | Status: DC

## 2014-09-13 MED ORDER — BENAZEPRIL HCL 20 MG PO TABS: 20.0000 mg | ORAL_TABLET | Freq: Every day | ORAL | Status: DC

## 2014-09-13 MED ORDER — LITHIUM CARBONATE ER 300 MG PO TBCR: 300.0000 mg | EXTENDED_RELEASE_TABLET | Freq: Two times a day (BID) | ORAL | Status: DC

## 2014-09-13 MED ORDER — ALBUTEROL SULFATE HFA 108 (90 BASE) MCG/ACT IN AERS: 2.0000 | INHALATION_SPRAY | Freq: Four times a day (QID) | RESPIRATORY_TRACT | Status: DC | PRN

## 2014-09-13 NOTE — Progress Notes (Addendum)
Assumed pt care at 1320. No report received, RN reviewed pt's chart. D: Patient is alert and oriented. Pt's mood and affect are anxious. Pt denies SI/HI and AVH. Pt reports she is [facility]. A: Encouragement/Support provided to pt. Active listening by RN. 15 minute checks continued per protocol for patient safety.  R: Patient cooperative and receptive to nursing interventions. Pt remains safe.
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
## 2014-09-13 NOTE — Progress Notes (Signed)
  [facility] :  Will you be returning to the same living situation after discharge:  Yes,  patient plans to return home At discharge, do you have transportation home?: Yes,  CSW assessing medicaid transportation options through [facility] Do you have the ability to pay for your medications: Yes,  patient will be provided with medication samples and prescriptions at discharge   Releases turned in by CSW to Medical Records.  Patient to Follow up at: Follow-up Information    [facility] On 09/19/2014.   Why:  Walk-in appointment on Tuesday May 17th between 8 am to 10:30 am for therapy and medication management services. Please let provider know that you are interested in ACTT team services. Please call if you need to reschedule appointment.   Contact information:   [address] [place], [place] [address] Phone: [phone] Fax: [phone]      Patient denies SI/HI: Yes,  denies    Safety Planning and Suicide Prevention discussed: Yes,  with patient  Have you used any form of tobacco in the last 30 days? (Cigarettes, Smokeless Tobacco, Cigars, and/or Pipes): No  Has patient been referred to the Quitline?: Yes, faxed on 09/13/14  Ezinne Yogi, [place] CarboKristin L 09/13/2014, 10:27 AM

## 2014-09-13 NOTE — Clinical Social Work Note (Signed)
CSW spoke with [facility] [phone] regarding scheduling transportation home today. Awaiting return call to confirm transportation pick up.  Samuella BruinKristin Meeka Cartelli, MSW, [employer]CSWA Clinical Social Worker [facility] [phone]

## 2014-09-13 NOTE — Clinical Social Work Note (Signed)
CSW spoke with Kennon RoundsSally at [facility] [phone] who reports that they will pick patient up at 2:30 pm to transport home.  Samuella BruinKristin Chevez Sambrano, MSW, [employer]CSWA Clinical Social Worker [facility] [phone]

## 2014-09-13 NOTE — Progress Notes (Signed)
Patient ID: Kathleen [name], female   DOB: 1966/02/02, 49 y.o.   MRN: [id number] D: Client called 911 police telling them that she was being held against her will, then began yelling that she has the right to leave. Client then began, cursing at Clinical research associatewriter, making derogatory, racial remarks saying that Clinical research associatewriter knew about everything and told police she could not leave. Client also make remarks about staff being liars and that she was not going to die in this hospital. A: Writer told client she was involuntarily committed and had not come in voluntarily as she had told writer on last night. Writer noted that the 72 hour request was invalid since she was involuntarily committed. R: Client was displeased with that explanation, Clinical research associatewriter showed client the involuntary forms. Client still cursing, would not allow writer to administer medication, but was administered by Vonzell SchlatterAshley H. [name].
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
## 2014-09-13 NOTE — Progress Notes (Signed)
DISCHARGE NOTE: D: Patient was alert, oriented, and ambulatory with a steady gait upon discharge. Pt denies SI/HI and AVH. A: AVS reviewed and given to pt. Follow up reviewed with pt. Resources reviewed with pt, including [facility]. Prescriptions/Medications given to pt. Belongings returned to pt. Pt given time to ask questions and express concerns. R: Pt D/C'd to safe hands representative.

## 2014-09-13 NOTE — [facility] Group Notes (Signed)
[facility] L[facility] Note  09/13/2014  8:45 AM  Participation Quality: Did Not Attend. Patient sleeping, patient invited to participate but declined.  Samuella BruinKristin Teodoro Jeffreys, MSW, [employer]CSWA Clinical Social Worker [facility] [phone]

## 2014-10-10 ENCOUNTER — Emergency Department (HOSPITAL_COMMUNITY)
Admission: EM | Admit: 2014-10-10 | Discharge: 2014-10-12 | Disposition: A | Discharge status: Other Acute Inpatient Hospital | Payer: MEDICAID | Attending: Emergency Medicine | Admitting: Emergency Medicine

## 2014-10-10 ENCOUNTER — Encounter (HOSPITAL_COMMUNITY): Payer: Self-pay | Admitting: *Deleted

## 2014-10-10 DIAGNOSIS — G40909 Epilepsy, unspecified, not intractable, without status epilepticus: Secondary | ICD-10-CM | POA: Diagnosis not present

## 2014-10-10 DIAGNOSIS — J45909 Unspecified asthma, uncomplicated: Secondary | ICD-10-CM | POA: Insufficient documentation

## 2014-10-10 DIAGNOSIS — J449 Chronic obstructive pulmonary disease, unspecified: Secondary | ICD-10-CM | POA: Diagnosis not present

## 2014-10-10 DIAGNOSIS — Z880 Allergy status to penicillin: Secondary | ICD-10-CM | POA: Insufficient documentation

## 2014-10-10 DIAGNOSIS — F329 Major depressive disorder, single episode, unspecified: Secondary | ICD-10-CM | POA: Diagnosis not present

## 2014-10-10 DIAGNOSIS — F250 Schizoaffective disorder, bipolar type: Secondary | ICD-10-CM | POA: Insufficient documentation

## 2014-10-10 DIAGNOSIS — Z794 Long term (current) use of insulin: Secondary | ICD-10-CM | POA: Insufficient documentation

## 2014-10-10 DIAGNOSIS — F419 Anxiety disorder, unspecified: Secondary | ICD-10-CM | POA: Insufficient documentation

## 2014-10-10 DIAGNOSIS — E119 Type 2 diabetes mellitus without complications: Secondary | ICD-10-CM | POA: Insufficient documentation

## 2014-10-10 DIAGNOSIS — G629 Polyneuropathy, unspecified: Secondary | ICD-10-CM | POA: Insufficient documentation

## 2014-10-10 DIAGNOSIS — Z79899 Other long term (current) drug therapy: Secondary | ICD-10-CM | POA: Insufficient documentation

## 2014-10-10 DIAGNOSIS — Z720 Tobacco use: Secondary | ICD-10-CM | POA: Diagnosis not present

## 2014-10-10 DIAGNOSIS — R443 Hallucinations, unspecified: Secondary | ICD-10-CM

## 2014-10-10 DIAGNOSIS — Z046 Encounter for general psychiatric examination, requested by authority: Secondary | ICD-10-CM | POA: Diagnosis present

## 2014-10-10 DIAGNOSIS — R45851 Suicidal ideations: Secondary | ICD-10-CM

## 2014-10-10 LAB — COMPREHENSIVE METABOLIC PANEL
ALT: 24 U/L (ref 14–54)
AST: 19 U/L (ref 15–41)
Albumin: 3.6 g/dL (ref 3.5–5.0)
Alkaline Phosphatase: 68 U/L (ref 38–126)
Anion gap: 8 (ref 5–15)
BUN: 15 mg/dL (ref 6–20)
CO2: 27 mmol/L (ref 22–32)
Calcium: 9.1 mg/dL (ref 8.9–10.3)
Chloride: 105 mmol/L (ref 101–111)
Creatinine, Ser: 1.24 mg/dL — ABNORMAL HIGH (ref 0.44–1.00)
GFR calc Af Amer: 59 mL/min — ABNORMAL LOW (ref >60)
GFR calc non Af Amer: 51 mL/min — ABNORMAL LOW (ref >60)
Glucose, Bld: 115 mg/dL — ABNORMAL HIGH (ref 65–99)
POTASSIUM: 3.9 mmol/L (ref 3.5–5.1)
SODIUM: 140 mmol/L (ref 135–145)
TOTAL PROTEIN: 7.3 g/dL (ref 6.5–8.1)
Total Bilirubin: 0.4 mg/dL (ref 0.3–1.2)

## 2014-10-10 LAB — CBC WITH DIFFERENTIAL/PLATELET
BASOS ABS: 0.1 10*3/uL (ref 0.0–0.1)
Basophils Relative: 1 % (ref 0–1)
Eosinophils Absolute: 0.1 10*3/uL (ref 0.0–0.7)
Eosinophils Relative: 1 % (ref 0–5)
HCT: 34.4 % — ABNORMAL LOW (ref 36.0–46.0)
HEMOGLOBIN: 11.6 g/dL — AB (ref 12.0–15.0)
LYMPHS ABS: 3.8 10*3/uL (ref 0.7–4.0)
LYMPHS PCT: 35 % (ref 12–46)
MCH: 23.7 pg — ABNORMAL LOW (ref 26.0–34.0)
MCHC: 33.7 g/dL (ref 30.0–36.0)
MCV: 70.3 fL — ABNORMAL LOW (ref 78.0–100.0)
Monocytes Absolute: 0.6 10*3/uL (ref 0.1–1.0)
Monocytes Relative: 6 % (ref 3–12)
NEUTROS PCT: 57 % (ref 43–77)
Neutro Abs: 6.2 10*3/uL (ref 1.7–7.7)
Platelets: 247 10*3/uL (ref 150–400)
RBC: 4.89 MIL/uL (ref 3.87–5.11)
RDW: 17.3 % — AB (ref 11.5–15.5)
WBC: 10.7 10*3/uL — ABNORMAL HIGH (ref 4.0–10.5)

## 2014-10-10 LAB — RAPID URINE DRUG SCREEN, HOSP PERFORMED
Amphetamines: NOT DETECTED
Barbiturates: NOT DETECTED
Benzodiazepines: NOT DETECTED
Cocaine: POSITIVE — AB
Opiates: NOT DETECTED
Tetrahydrocannabinol: NOT DETECTED

## 2014-10-10 LAB — ETHANOL: Alcohol, Ethyl (B): <5 mg/dL (ref <5)

## 2014-10-10 MED ORDER — PANTOPRAZOLE SODIUM 40 MG PO TBEC
40.0000 mg | DELAYED_RELEASE_TABLET | Freq: Every day | ORAL | Status: DC
  Administered 2014-10-11 – 2014-10-12 (×2): 40 mg via ORAL
  Filled 2014-10-10 (×2): qty 1

## 2014-10-10 MED ORDER — HYDROXYZINE HCL 25 MG PO TABS
25.0000 mg | ORAL_TABLET | Freq: Three times a day (TID) | ORAL | Status: DC | PRN
  Administered 2014-10-11 (×2): 25 mg via ORAL
  Filled 2014-10-10 (×3): qty 1

## 2014-10-10 MED ORDER — HYDROCHLOROTHIAZIDE 12.5 MG PO CAPS
12.5000 mg | ORAL_CAPSULE | Freq: Every day | ORAL | Status: DC
  Administered 2014-10-11 – 2014-10-12 (×2): 12.5 mg via ORAL
  Filled 2014-10-10 (×2): qty 1

## 2014-10-10 MED ORDER — ZIPRASIDONE HCL 20 MG PO CAPS
20.0000 mg | ORAL_CAPSULE | Freq: Two times a day (BID) | ORAL | Status: DC
  Administered 2014-10-11 – 2014-10-12 (×3): 20 mg via ORAL
  Filled 2014-10-10 (×3): qty 1

## 2014-10-10 MED ORDER — GABAPENTIN 300 MG PO CAPS
300.0000 mg | ORAL_CAPSULE | Freq: Three times a day (TID) | ORAL | Status: DC
Start: 2014-10-10 — End: 2014-10-12
  Administered 2014-10-11 – 2014-10-12 (×3): 300 mg via ORAL
  Filled 2014-10-10 (×4): qty 1

## 2014-10-10 MED ORDER — LITHIUM CARBONATE 300 MG PO CAPS
300.0000 mg | ORAL_CAPSULE | Freq: Two times a day (BID) | ORAL | Status: DC
  Administered 2014-10-11 – 2014-10-12 (×3): 300 mg via ORAL
  Filled 2014-10-10 (×3): qty 1

## 2014-10-10 MED ORDER — FAMOTIDINE 20 MG PO TABS
20.0000 mg | ORAL_TABLET | Freq: Every day | ORAL | Status: DC
Start: 2014-10-10 — End: 2014-10-12
  Administered 2014-10-11: 20 mg via ORAL
  Filled 2014-10-10 (×2): qty 1

## 2014-10-10 NOTE — ED Notes (Addendum)
Pt states she is homicidal and suicidal. Pt states she wants to overdose on pills and pt wants to kill her boyfriend. Pt has been having these thoughts all day. Pt here with several bags of clothes, comforter, and pillow.

## 2014-10-10 NOTE — ED Notes (Signed)
Pt wanded in triage  

## 2014-10-10 NOTE — BH Assessment (Signed)
Assessment completed. Consulted Donell SievertSpencer Simon, PA-C who agrees that pt meets inpatient criteria. TTS will contact facilities for placement once pt is medically cleared. Dr. Estell HarpinZammit has been informed of the recommendation.
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
## 2014-10-10 NOTE — ED Notes (Signed)
Telepsych assessment in process 

## 2014-10-11 LAB — CBG MONITORING, ED
Glucose-Capillary: 158 mg/dL — ABNORMAL HIGH (ref 65–99)
Glucose-Capillary: 134 mg/dL — ABNORMAL HIGH (ref 65–99)

## 2014-10-11 MED ORDER — NICOTINE 21 MG/24HR TD PT24
21.0000 mg | MEDICATED_PATCH | Freq: Every day | TRANSDERMAL | Status: DC
  Administered 2014-10-12: 21 mg via TRANSDERMAL
  Filled 2014-10-11 (×2): qty 1

## 2014-10-11 MED ORDER — NICOTINE 21 MG/24HR TD PT24
MEDICATED_PATCH | TRANSDERMAL | Status: AC
  Administered 2014-10-11: 21 mg
  Filled 2014-10-11: qty 1

## 2014-10-11 NOTE — ED Notes (Signed)
Pt want to be retelepsyched to be able to go home. Pt states it is driving her crazy sitting here and waiting. [facility] notified of pt's wishes.

## 2014-10-11 NOTE — ED Notes (Signed)
Patient requested family not be told she is here. Does not want to speak to anyone. Front desk made aware

## 2014-10-11 NOTE — ED Notes (Signed)
Pt informed of IVC status.

## 2014-10-11 NOTE — Progress Notes (Signed)
CSW seeking inpatient placement as recommended by psychiatry.  Faxed referral to: [facility]- [facility]- per Carollee HerterShannon "no beds right now but fax in case of d/cs" [facility]- Per Agustin Creearlene, possibility of bed availability later  [facility] Arlys [facility]- call back in the afternoon to check for d/cs [facility]- per Jerilynn Somalvin, same as above [facility]- per [facility]- per [facility]- per [facility]  Left voicemail for [facility], [facility], and [facility]; will fax if availability is communicated.   Ilean SkillMeghan Vearl Aitken, MSW, LCSWA Clinical Social Work, Disposition  10/11/2014 [phone]

## 2014-10-11 NOTE — Progress Notes (Signed)
Patient was referred for IP treatment at: [facility] - per Aimie, will have beds in am. [facility] - per Aimie, no geri, adult low acuity beds only  [facility] - per [facility] (assessment team) [facility] - [facility] - voicemail. [facility] - per intake  At capacity: [facility]   [facility] and [facility] don't take Medicaid for adults.  CSW will continue to seek placement.  Mel[place] Abtsatia Kathleen [name], LCSWA Disposition staff 10/11/2014 7:27 PM

## 2014-10-12 NOTE — ED Notes (Signed)
Megan from [facility] said pt had been accepted at [facility], room 2531. Dr, Zonia Kief. Report can be called at [phone]. Pt aware and in agreement

## 2014-10-12 NOTE — Progress Notes (Signed)
Pt accepted to [facility] by Dr. Zonia Kief per Agustin Cree. Bed 2561, Report [phone]. Can be transported when ready (pt is under IVC).  Spoke with [facility] charge RN re: pt's placement.  Ilean Skill, MSW, LCSWA Clinical Social Work, Disposition  10/12/2014 [phone]

## 2014-10-12 NOTE — ED Notes (Signed)
Pt given belongings and verified that all of her things were present.  Will give belongings to police

## 2016-02-04 ENCOUNTER — Telehealth: Payer: Self-pay | Admitting: Orthopedic Surgery
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
## 2016-02-18 ENCOUNTER — Encounter: Payer: Self-pay | Admitting: Orthopedic Surgery

## 2016-02-18 ENCOUNTER — Ambulatory Visit (INDEPENDENT_AMBULATORY_CARE_PROVIDER_SITE_OTHER): Payer: Medicaid Other | Admitting: Orthopedic Surgery

## 2016-02-18 ENCOUNTER — Ambulatory Visit (INDEPENDENT_AMBULATORY_CARE_PROVIDER_SITE_OTHER): Payer: Medicaid Other

## 2016-02-18 VITALS — BP 129/86 | HR 90 | Wt 197.0 lb

## 2016-02-18 DIAGNOSIS — M25562 Pain in left knee: Secondary | ICD-10-CM

## 2016-02-18 DIAGNOSIS — S8002XA Contusion of left knee, initial encounter: Secondary | ICD-10-CM

## 2016-02-18 MED ORDER — TRAMADOL-ACETAMINOPHEN 37.5-325 MG PO TABS
1.0000 | ORAL_TABLET | ORAL | 5 refills | Status: DC | PRN
Start: 2016-02-18 — End: 2016-03-31

## 2016-02-18 MED ORDER — IBUPROFEN 800 MG PO TABS: 800.0000 mg | ORAL_TABLET | Freq: Three times a day (TID) | ORAL | 5 refills | Status: DC | PRN
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
## 2016-02-18 NOTE — Patient Instructions (Signed)
Start kn.ee therapy   Return 6   WEEKS

## 2016-02-19 ENCOUNTER — Telehealth: Payer: Self-pay | Admitting: Orthopaedic Surgery

## 2016-02-19 NOTE — Telephone Encounter (Signed)
STOP ULTRACET   TAKE IBUPROFEN

## 2016-02-19 NOTE — Telephone Encounter (Signed)
Patient called to relay that the medication prescribed at office visit yesterday,  traMADol-acetaminophen (ULTRACET) 37.5-325 MG tablet 90 tablet    - medication "caused her to throw up last night, and to have cramping"; said that she had to call her daughter to stay with her last night due to being so sick.  States that the previous medication she had used, Hydrocodone-acetaminophen 5-325 did not cause these symptoms.  [facility]  Please advise.  Patient's ph# is [phone]

## 2016-02-19 NOTE — Telephone Encounter (Signed)
Routing to Dr. Harrison to advise 

## 2016-02-19 NOTE — Telephone Encounter (Signed)
Patient reports that she is allergic to ibuprofen, causes rash and vomit, and she is in a lot of pain

## 2016-02-20 ENCOUNTER — Telehealth: Payer: Self-pay | Admitting: Orthopedic Surgery

## 2016-02-20 NOTE — Telephone Encounter (Signed)
Patient aware.

## 2016-02-20 NOTE — Telephone Encounter (Signed)
Patient returned Kathleen [name]'s call about her medicine. I spoke with Asher MuirJamie and she told me to relay Dr. Mort SawyersHarrison's message to the patient. I told the patient that Dr. Romeo AppleHarrison said for her to take Tylenol 500 mg every 6 hours. The patient never replied anything just hung up the phone.

## 2016-02-20 NOTE — Telephone Encounter (Signed)
Dr. Mort SawyersHarrison's patient.  Somehow sent to me.

## 2016-02-20 NOTE — Telephone Encounter (Signed)
Left message to return call 

## 2016-02-20 NOTE — Telephone Encounter (Signed)
Tylenol xs 500 mg every 6 hrs

## 2016-03-31 ENCOUNTER — Ambulatory Visit (INDEPENDENT_AMBULATORY_CARE_PROVIDER_SITE_OTHER): Payer: Medicaid Other | Admitting: Orthopedic Surgery

## 2016-03-31 ENCOUNTER — Encounter: Payer: Self-pay | Admitting: Orthopedic Surgery

## 2016-03-31 DIAGNOSIS — S8002XD Contusion of left knee, subsequent encounter: Secondary | ICD-10-CM | POA: Diagnosis not present

## 2016-03-31 NOTE — Progress Notes (Signed)
Patient ID: Kathleen [name] [place], female   DOB: 1966-01-02, 50 y.o.   MRN: [id number]  Chief Complaint  Patient presents with  . Follow-up    left knee contusion    HPI Kathleen [name] Starks is a 50 y.o. female.   HPI Follow-up after left knee contusion patient in therapy doing well improving still having some giving out symptoms or sitting for long periods of time.   Review of Systems Review of Systems Complains of nighttime spasms   Examination LMP 12/27/2012   Gen. appearance the patient's appearance is normal with normal grooming and  hygiene The patient is oriented to person place and time Mood and affect are normal   Ortho Exam Gait is remarkable for minor alteration with a limp Inspection reveals no swelling or tenderness ROM is normal Stability tests are normal  Motor exam 5/5 manual muscle testing , no atrophy  Skin is normal (no rash or erythema)    Medical decision-making Diagnosis, Data, Plan (risk)  Encounter Diagnosis  Name Primary?  . Contusion of left knee, subsequent encounter Yes   Improving contusion  Added Flexeril for nighttime spasms, continue therapy until December 5 follow-up in 4 weeks  Fuller CanadaStanley Jasmon Mattice, MD 03/31/2016 1:53 PM

## 2016-04-10 ENCOUNTER — Telehealth: Payer: Self-pay | Admitting: Orthopedic Surgery

## 2016-04-10 NOTE — Telephone Encounter (Signed)
Kim [facility] called stating that they received a order yesterday for this patient. She stated that this patient has just finished up on Tuesday 04/08/16 and was wondering if this is a new order.  Please call and advise  [phone]  Opt[employer] [address]

## 2016-04-10 NOTE — Telephone Encounter (Signed)
Advised no new order

## 2016-05-06 ENCOUNTER — Telehealth: Payer: Self-pay | Admitting: Orthopedic Surgery

## 2016-05-06 NOTE — Telephone Encounter (Signed)
[facility] faxed and called regarding refill:  Cyclobenzapr 10mg  tablet, quantity 30 tablets.  Please note patient's AKA name: Kathleen [name]

## 2016-05-06 NOTE — Telephone Encounter (Signed)
approved

## 2016-05-06 NOTE — Telephone Encounter (Signed)
ROUTING TO DR HARRISON FOR REVIEW 

## 2016-05-07 ENCOUNTER — Other Ambulatory Visit: Payer: Self-pay | Admitting: *Deleted

## 2016-05-07 MED ORDER — CYCLOBENZAPRINE HCL 10 MG PO TABS: 10.0000 mg | ORAL_TABLET | Freq: Two times a day (BID) | ORAL | 0 refills | Status: DC | PRN

## 2016-05-09 ENCOUNTER — Encounter: Payer: Self-pay | Admitting: Orthopedic Surgery

## 2016-05-09 ENCOUNTER — Ambulatory Visit (INDEPENDENT_AMBULATORY_CARE_PROVIDER_SITE_OTHER): Payer: Medicaid Other | Admitting: Orthopedic Surgery

## 2016-05-09 DIAGNOSIS — M23232 Derangement of other medial meniscus due to old tear or injury, left knee: Secondary | ICD-10-CM

## 2016-05-09 DIAGNOSIS — G8929 Other chronic pain: Secondary | ICD-10-CM

## 2016-05-09 DIAGNOSIS — M545 Low back pain: Secondary | ICD-10-CM | POA: Diagnosis not present

## 2016-05-09 DIAGNOSIS — M25562 Pain in left knee: Secondary | ICD-10-CM

## 2016-05-09 DIAGNOSIS — S8002XD Contusion of left knee, subsequent encounter: Secondary | ICD-10-CM | POA: Diagnosis not present

## 2016-05-09 MED ORDER — ACETAMINOPHEN-CODEINE 300-30 MG PO TABS: 1.0000 | ORAL_TABLET | Freq: Four times a day (QID) | ORAL | 0 refills | Status: DC | PRN

## 2016-05-09 MED ORDER — NAPROXEN 500 MG PO TABS: 500.0000 mg | ORAL_TABLET | Freq: Two times a day (BID) | ORAL | 1 refills | Status: DC

## 2016-05-09 NOTE — Patient Instructions (Addendum)
Sign release for new attorney Twana FirstKenneth Johnson to get records  MRI left knee arrange follow-up visit  Start Naprosyn 500 mg twice a day and Tylenol with codeine one every 6  Appropriate warnings were discussed with patient including death and addiction and new 05/05/2016 restrictions for Medicaid patients
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
## 2016-05-12 ENCOUNTER — Telehealth: Payer: Self-pay | Admitting: Orthopedic Surgery

## 2016-05-12 NOTE — Telephone Encounter (Signed)
Pt called asking about when she was scheduled for her  MRI.  I told her that she was scheduled for 05/15/16 @ 4 pm and she needed to be there by 3:45. She has called back stating that [facility] told her that they don't run that late and she would need to get it scheduled for morning time. She is asking if we could reschedule it for her so she can get with [facility] for transportation.   Please advise

## 2016-05-12 NOTE — Telephone Encounter (Signed)
Please give patient the number to call and reschedule [phone], have her call you back and advise when appt is in case follow up needs to be adjusted

## 2016-05-15 ENCOUNTER — Ambulatory Visit (HOSPITAL_COMMUNITY): Payer: Medicaid Other

## 2016-05-19 ENCOUNTER — Ambulatory Visit: Payer: Self-pay | Admitting: Orthopedic Surgery

## 2016-05-23 ENCOUNTER — Ambulatory Visit (HOSPITAL_COMMUNITY): Payer: Medicaid Other

## 2016-05-26 ENCOUNTER — Telehealth: Payer: Self-pay | Admitting: Orthopedic Surgery

## 2016-05-26 ENCOUNTER — Other Ambulatory Visit: Payer: Self-pay | Admitting: *Deleted

## 2016-05-26 ENCOUNTER — Ambulatory Visit: Payer: Self-pay | Admitting: Orthopedic Surgery

## 2016-05-26 MED ORDER — CYCLOBENZAPRINE HCL 10 MG PO TABS: 10.0000 mg | ORAL_TABLET | Freq: Two times a day (BID) | ORAL | 0 refills | Status: DC | PRN

## 2016-05-26 NOTE — Telephone Encounter (Signed)
Cyclobenzaprine (Flexeril) 10 mg Tablet  Qty 30 Tablets  Take 1 tablet (10 mg total) by mouth 2 (two) times daily as needed for muscle spasms.

## 2016-05-26 NOTE — Telephone Encounter (Signed)
ROUTING TO DR HARRISON FOR APPROVAL 

## 2016-05-26 NOTE — Telephone Encounter (Signed)
YES

## 2016-05-26 NOTE — Telephone Encounter (Signed)
REFILL SENT TO PHARMACY  

## 2016-05-28 ENCOUNTER — Ambulatory Visit: Payer: Self-pay | Admitting: Orthopedic Surgery

## 2016-05-30 ENCOUNTER — Ambulatory Visit (HOSPITAL_COMMUNITY): Admission: RE | Admit: 2016-05-30 | Payer: Medicaid Other | Source: Ambulatory Visit

## 2016-06-02 ENCOUNTER — Ambulatory Visit: Payer: Self-pay | Admitting: Orthopedic Surgery

## 2016-06-02 ENCOUNTER — Ambulatory Visit (HOSPITAL_COMMUNITY)
Admission: RE | Admit: 2016-06-02 | Discharge: 2016-06-02 | Disposition: A | Discharge status: Home / Self Care | Payer: Medicaid Other | Source: Ambulatory Visit | Attending: Orthopedic Surgery | Admitting: Orthopedic Surgery

## 2016-06-02 DIAGNOSIS — M25562 Pain in left knee: Secondary | ICD-10-CM | POA: Insufficient documentation

## 2016-06-06 ENCOUNTER — Ambulatory Visit (INDEPENDENT_AMBULATORY_CARE_PROVIDER_SITE_OTHER): Payer: Medicaid Other | Admitting: Orthopedic Surgery

## 2016-06-06 ENCOUNTER — Encounter: Payer: Self-pay | Admitting: Orthopedic Surgery

## 2016-06-06 DIAGNOSIS — G8929 Other chronic pain: Secondary | ICD-10-CM | POA: Diagnosis not present

## 2016-06-06 DIAGNOSIS — M545 Low back pain: Secondary | ICD-10-CM

## 2016-06-06 DIAGNOSIS — M25562 Pain in left knee: Secondary | ICD-10-CM

## 2016-06-06 MED ORDER — ACETAMINOPHEN-CODEINE 300-30 MG PO TABS: 1.0000 | ORAL_TABLET | Freq: Four times a day (QID) | ORAL | 0 refills | Status: DC | PRN

## 2016-06-06 NOTE — Progress Notes (Signed)
FOLLOW UP VISIT  Chief Complaint  Patient presents with  . Follow-up    MRI REVIEW LEFT KNEE   [place] controlled substance reporting system reviewed Meds ordered this encounter  Medications  . Acetaminophen-Codeine 300-30 MG tablet    Sig: Take 1 tablet by mouth every 6 (six) hours as needed for pain.    Dispense:  28 tablet    Refill:  0   MRI was taken to evaluate the left knee. Report is included and it indicates no internal derangement  I agree after reviewing the MRI that there is no internal derangement  I discussed this with the patient she is having some difficulty with physical therapy because her exercises standing or increasing her back and leg pain so we have asked him to do core exercises and therapeutic modalities without standing  The patient will follow with her primary care doctor for referral for her lumbar spine condition which we did not treat  She is released from us  1 last prescription was given  Time spent 15 minutes

## 2016-06-06 NOTE — Patient Instructions (Signed)
[place] controlled substance reporting system reviewed Meds ordered this encounter  Medications  . Acetaminophen-Codeine 300-30 MG tablet    Sig: Take 1 tablet by mouth every 6 (six) hours as needed for pain.    Dispense:  28 tablet    Refill:  0   What You Need to Know About Prescription Opioid Pain Medicine        Please be advised. You are on a medication which is classified as an "opiod". The CDC the [facility]  has recently advised all providers to advise patient's that these medications have certain risks which include but are not limited to:    drug intolerance  drug addiction  respiratory depression   respiratory failure  Death  Please keep these medications locked away. If you feel that you are becoming addicted to these medicines or you are having difficulties with these medications please alert your provider.   As your provider I will attempt to wean you off of these medications when you're severe acute pain has been taking care of. However, if we cannot wean you off of this medication you will be sent to a pain management center where they can better manage chronic pain   Opioids are powerful medicines that are used to treat moderate to severe pain. Opioids should be taken with the supervision of a trained health care provider. They should be taken for the shortest period of time as possible. This is because opioids can be addictive and the longer you take opioids, the greater your risk of addiction (opioid use disorder). What do opioids do? Opioids help reduce or eliminate pain. When used for short periods of time, they can help you:  Sleep better.  Do better in physical or occupational therapy.  Feel better in the first few days after an injury.  Recover from surgery. What kind of problems can opioids cause? Opioids can cause side effects, such as:  Constipation.  Nausea.  Vomiting.  Drowsiness.  Confusion.  Opioid use  disorder.  Breathing difficulties (respiratory depression). Using opioid pain medicines for longer than 3 days increases your risk of these side effects. Taking opioid pain medicine for a long period of time can affect your ability to do daily tasks. It also puts you at risk for:  Car accidents.  Heart attack.  Overdose, which can sometimes lead to death. What can increase my risk for developing problems while taking opioids? You may be at an especially high risk for problems while taking opioids if you:  Are over the age of 50.  Are pregnant.  Have kidney or liver disease.  Have certain mental health conditions, such as depression or anxiety.  Have a history of substance use disorder.  Have had an opioid overdose in the past. How do I stop taking opioids if I have been taking them for a long time? If you have been taking opioid medicine for more than a few weeks, you may need to slowly stop taking them (taper). Tapering your use of opioids can decrease your chances of experiencing withdrawal symptoms, such as:  Abdominal pain and cramping.  Nausea.  Sweating.  Sleepiness.  Restlessness.  Uncontrollable shaking (tremors).  Cravings for the medicine. Do not attempt to taper your use of opioids on your own. Talk with your health care provider about how to do this. Your health care provider may prescribe a step-down schedule based on how much medicine you are taking and how long you have been taking it.  What are the benefits of stopping the use of opioids? By switching from opioid pain medicine to non-opioid pain management options, you will decrease your risk of accidents and injuries associated with long-term opioid use. You will also be able to:  Monitor your pain more accurately and know when to seek medical care if it is not improving.  Decrease risk to others around you. Having opioids in the home increases the risk for accidental or intentional use or overdose by  others. How can I treat pain without opioids? Pain can be managed with many types of alternative treatments. Ask your health care provider to refer you to one or more specialists who can help you manage pain through:  Physical or occupational therapy.  Counseling (cognitive-behavioral therapy).  Good nutrition.  Biofeedback.  Massage.  Meditation.  Non-opioid medicine.  Following a gentle exercise program. Where can I get support? If you have been taking opioids for a long time, you may benefit from receiving support for quitting from a local support group or counselor. Ask your health care provider for a referral to these resources in your area. When should I seek medical care? Seek medical care right away if you are taking opioids and you experience any of the following:  Difficulty breathing.  Breathing that is more shallow or slower than normal.  A very slow heartbeat (pulse).  Severe confusion.  Unconsciousness.  Sleepiness.  Difficulty waking from sleep.  Slurred speech.  Nausea and vomiting.  Cold, clammy skin.  Blue lips or fingernails.  Limpness.  Abnormally small pupils. If you think that you or someone else may have taken too much of an opioid medicine, get medical help right away. Do not wait to see if the symptoms go away on their own. Call your local emergency services (911 in the [place] or call the hotline of the [facility] (616)104-2243 in the [place]  Where can I get more information? To learn more about opioid medicines, visit the Centers for Disease Control and Prevention web site Opioid Basics at [employer].pt. Summary  Opioid medicines can help you manage moderate-to-severe pain for a short period of time.  Taking opioid pain medicine for a long period of time puts you at risk for unintentional accidents, injury, and even death.  If you think that you or someone else may have taken  too much of an opioid, get medical help right away. This information is not intended to replace advice given to you by your health care provider. Make sure you discuss any questions you have with your health care provider. Document Released: 05/18/2015 Document Revised: 12/14/2015 Document Reviewed: 12/01/2014 Elsevier Interactive Patient Education  2017 [employer].

## 2016-06-06 NOTE — Addendum Note (Signed)
Addended by: Adella HareBOOTHE, JAIME B on: 06/06/2016 11:36 AM   Modules accepted: Orders

## 2016-07-14 ENCOUNTER — Telehealth: Payer: Self-pay | Admitting: Orthopedic Surgery

## 2016-07-14 ENCOUNTER — Other Ambulatory Visit: Payer: Self-pay | Admitting: *Deleted

## 2016-07-14 MED ORDER — CYCLOBENZAPRINE HCL 10 MG PO TABS: 10.0000 mg | ORAL_TABLET | Freq: Two times a day (BID) | ORAL | 0 refills | Status: DC | PRN

## 2016-07-14 NOTE — Telephone Encounter (Signed)
Cyclobenzaprine(Flexeril) 10MG   Qty  30   Take 1 tablet (10mg  total) by mouth 2 (two) times daily as needed for muscle spasms.

## 2016-07-14 NOTE — Telephone Encounter (Signed)
YES

## 2016-07-14 NOTE — Telephone Encounter (Signed)
ROUTING TO DR HARRISON FOR APPROVAL 

## 2016-08-14 ENCOUNTER — Encounter (HOSPITAL_COMMUNITY): Payer: Self-pay | Admitting: Emergency Medicine

## 2016-08-14 ENCOUNTER — Emergency Department (HOSPITAL_COMMUNITY)
Admission: EM | Admit: 2016-08-14 | Discharge: 2016-08-14 | Disposition: A | Discharge status: Home / Self Care | Payer: Medicaid Other | Attending: Emergency Medicine | Admitting: Emergency Medicine

## 2016-08-14 DIAGNOSIS — J449 Chronic obstructive pulmonary disease, unspecified: Secondary | ICD-10-CM | POA: Diagnosis not present

## 2016-08-14 DIAGNOSIS — E119 Type 2 diabetes mellitus without complications: Secondary | ICD-10-CM | POA: Diagnosis not present

## 2016-08-14 DIAGNOSIS — I10 Essential (primary) hypertension: Secondary | ICD-10-CM | POA: Insufficient documentation

## 2016-08-14 DIAGNOSIS — F419 Anxiety disorder, unspecified: Secondary | ICD-10-CM | POA: Diagnosis not present

## 2016-08-14 DIAGNOSIS — Z79899 Other long term (current) drug therapy: Secondary | ICD-10-CM | POA: Diagnosis not present

## 2016-08-14 DIAGNOSIS — F1490 Cocaine use, unspecified, uncomplicated: Secondary | ICD-10-CM | POA: Diagnosis not present

## 2016-08-14 DIAGNOSIS — M5442 Lumbago with sciatica, left side: Secondary | ICD-10-CM | POA: Insufficient documentation

## 2016-08-14 DIAGNOSIS — Z794 Long term (current) use of insulin: Secondary | ICD-10-CM | POA: Insufficient documentation

## 2016-08-14 DIAGNOSIS — Z87891 Personal history of nicotine dependence: Secondary | ICD-10-CM | POA: Insufficient documentation

## 2016-08-14 DIAGNOSIS — G8929 Other chronic pain: Secondary | ICD-10-CM | POA: Diagnosis not present

## 2016-08-14 DIAGNOSIS — S8002XD Contusion of left knee, subsequent encounter: Secondary | ICD-10-CM

## 2016-08-14 DIAGNOSIS — J45909 Unspecified asthma, uncomplicated: Secondary | ICD-10-CM | POA: Insufficient documentation

## 2016-08-14 DIAGNOSIS — M545 Low back pain: Secondary | ICD-10-CM | POA: Diagnosis present

## 2016-08-14 HISTORY — DX: Essential (primary) hypertension: I10

## 2016-08-14 MED ORDER — NAPROXEN 500 MG PO TABS: 500.0000 mg | ORAL_TABLET | Freq: Two times a day (BID) | ORAL | 1 refills | Status: DC

## 2016-08-14 MED ORDER — METHOCARBAMOL 500 MG PO TABS
1000.0000 mg | ORAL_TABLET | Freq: Once | ORAL | Status: AC
  Administered 2016-08-14: 1000 mg via ORAL
  Filled 2016-08-14: qty 2

## 2016-08-14 MED ORDER — KETOROLAC TROMETHAMINE 60 MG/2ML IM SOLN
60.0000 mg | Freq: Once | INTRAMUSCULAR | Status: AC
  Administered 2016-08-14: 60 mg via INTRAMUSCULAR
  Filled 2016-08-14: qty 2

## 2016-08-14 MED ORDER — METHOCARBAMOL 500 MG PO TABS: 500.0000 mg | ORAL_TABLET | Freq: Three times a day (TID) | ORAL | 0 refills | Status: DC | PRN
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
## 2016-08-27 ENCOUNTER — Telehealth: Payer: Self-pay | Admitting: Orthopedic Surgery

## 2016-08-27 ENCOUNTER — Other Ambulatory Visit: Payer: Self-pay | Admitting: *Deleted

## 2016-08-27 MED ORDER — CYCLOBENZAPRINE HCL 10 MG PO TABS: 10.0000 mg | ORAL_TABLET | Freq: Two times a day (BID) | ORAL | 0 refills | Status: DC | PRN

## 2016-08-27 NOTE — Telephone Encounter (Signed)
Routing to Dr Harrison for approval 

## 2016-08-27 NOTE — Telephone Encounter (Signed)
Cyclobenzaprine (Flexeril) 10 Mg  Qty  30 Tablets.  Take 1 tablet (10 mg total) by mouth 2 (two) times daily as needed for muscle spasms.

## 2016-08-27 NOTE — Telephone Encounter (Signed)
Yes

## 2017-07-08 ENCOUNTER — Emergency Department (HOSPITAL_COMMUNITY): Payer: Medicaid Other

## 2017-07-08 ENCOUNTER — Other Ambulatory Visit: Payer: Self-pay

## 2017-07-08 ENCOUNTER — Encounter (HOSPITAL_COMMUNITY): Payer: Self-pay | Admitting: Emergency Medicine

## 2017-07-08 ENCOUNTER — Emergency Department (HOSPITAL_COMMUNITY)
Admission: EM | Admit: 2017-07-08 | Discharge: 2017-07-08 | Disposition: A | Discharge status: Home / Self Care | Payer: Medicaid Other | Attending: Emergency Medicine | Admitting: Emergency Medicine

## 2017-07-08 DIAGNOSIS — Z79899 Other long term (current) drug therapy: Secondary | ICD-10-CM | POA: Insufficient documentation

## 2017-07-08 DIAGNOSIS — J449 Chronic obstructive pulmonary disease, unspecified: Secondary | ICD-10-CM | POA: Insufficient documentation

## 2017-07-08 DIAGNOSIS — F1420 Cocaine dependence, uncomplicated: Secondary | ICD-10-CM | POA: Diagnosis not present

## 2017-07-08 DIAGNOSIS — S161XXA Strain of muscle, fascia and tendon at neck level, initial encounter: Secondary | ICD-10-CM | POA: Diagnosis not present

## 2017-07-08 DIAGNOSIS — Y998 Other external cause status: Secondary | ICD-10-CM | POA: Insufficient documentation

## 2017-07-08 DIAGNOSIS — F17210 Nicotine dependence, cigarettes, uncomplicated: Secondary | ICD-10-CM | POA: Diagnosis not present

## 2017-07-08 DIAGNOSIS — F419 Anxiety disorder, unspecified: Secondary | ICD-10-CM | POA: Insufficient documentation

## 2017-07-08 DIAGNOSIS — Y9389 Activity, other specified: Secondary | ICD-10-CM | POA: Insufficient documentation

## 2017-07-08 DIAGNOSIS — J45909 Unspecified asthma, uncomplicated: Secondary | ICD-10-CM | POA: Insufficient documentation

## 2017-07-08 DIAGNOSIS — Y92410 Unspecified street and highway as the place of occurrence of the external cause: Secondary | ICD-10-CM | POA: Diagnosis not present

## 2017-07-08 DIAGNOSIS — Z794 Long term (current) use of insulin: Secondary | ICD-10-CM | POA: Insufficient documentation

## 2017-07-08 DIAGNOSIS — I10 Essential (primary) hypertension: Secondary | ICD-10-CM | POA: Insufficient documentation

## 2017-07-08 DIAGNOSIS — R42 Dizziness and giddiness: Secondary | ICD-10-CM | POA: Diagnosis not present

## 2017-07-08 DIAGNOSIS — S199XXA Unspecified injury of neck, initial encounter: Secondary | ICD-10-CM | POA: Diagnosis present

## 2017-07-08 DIAGNOSIS — E119 Type 2 diabetes mellitus without complications: Secondary | ICD-10-CM | POA: Diagnosis not present

## 2017-07-08 HISTORY — DX: Other chronic pain: G89.29

## 2017-07-08 HISTORY — DX: Dorsalgia, unspecified: M54.9

## 2017-07-08 MED ORDER — NAPROXEN 500 MG PO TABS: 500.0000 mg | ORAL_TABLET | Freq: Two times a day (BID) | ORAL | 0 refills | Status: DC

## 2017-07-08 MED ORDER — HYDROCODONE-ACETAMINOPHEN 5-325 MG PO TABS
1.0000 | ORAL_TABLET | Freq: Once | ORAL | Status: AC
  Administered 2017-07-08: 1 via ORAL
  Filled 2017-07-08: qty 1

## 2017-07-08 MED ORDER — METHOCARBAMOL 500 MG PO TABS: 500.0000 mg | ORAL_TABLET | Freq: Three times a day (TID) | ORAL | 0 refills | Status: AC

## 2017-07-08 MED ORDER — METHOCARBAMOL 500 MG PO TABS
500.0000 mg | ORAL_TABLET | Freq: Once | ORAL | Status: AC
  Administered 2017-07-08: 500 mg via ORAL
  Filled 2017-07-08: qty 1

## 2017-07-08 NOTE — ED Notes (Signed)
Signature pads in room and at desk not working. Patient given discharge instructions and prescriptions. Verbalized understanding.
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
## 2017-07-08 NOTE — ED Notes (Signed)
Pt went up to registration stating that she was feeling dizzy and blood pressure was elevated. Pt noted to continuously be walking around waiting area with steady gait, pt able to converse on telephone with family members, and is AOx4. EKG already performed and given to EDP. Will continue to monitor for any neurological changes or deterioration of patient condition.

## 2017-07-08 NOTE — ED Notes (Signed)
C-collar placed on pt by Maralyn SagoSarah, RN

## 2017-07-08 NOTE — Discharge Instructions (Signed)
As discussed, do not wear the neck collar continuously and do not wear at bedtime.  Apply ice packs on and off to your neck and low back if needed.  Follow-up with your primary doctor for recheck in a few days.  Return to the ER for any worsening symptoms.
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
## 2017-08-07 ENCOUNTER — Encounter: Payer: Self-pay | Admitting: Physical Therapy

## 2017-08-07 ENCOUNTER — Ambulatory Visit: Payer: Self-pay | Admitting: Physical Therapy

## 2017-08-07 ENCOUNTER — Other Ambulatory Visit: Payer: Self-pay

## 2017-08-07 ENCOUNTER — Ambulatory Visit: Payer: Medicaid Other | Attending: Family Medicine | Admitting: Physical Therapy

## 2017-08-07 DIAGNOSIS — M542 Cervicalgia: Secondary | ICD-10-CM | POA: Diagnosis present

## 2017-08-07 DIAGNOSIS — M25512 Pain in left shoulder: Secondary | ICD-10-CM | POA: Diagnosis present

## 2017-08-07 DIAGNOSIS — M545 Low back pain, unspecified: Secondary | ICD-10-CM

## 2017-08-07 DIAGNOSIS — M25551 Pain in right hip: Secondary | ICD-10-CM | POA: Diagnosis present

## 2017-08-07 DIAGNOSIS — M25511 Pain in right shoulder: Secondary | ICD-10-CM | POA: Diagnosis present
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
## 2017-08-18 ENCOUNTER — Ambulatory Visit: Payer: Medicaid Other | Admitting: Physical Therapy

## 2017-08-18 ENCOUNTER — Encounter: Payer: Self-pay | Admitting: Physical Therapy

## 2017-08-18 DIAGNOSIS — M545 Low back pain, unspecified: Secondary | ICD-10-CM

## 2017-08-18 DIAGNOSIS — M542 Cervicalgia: Secondary | ICD-10-CM

## 2017-08-18 DIAGNOSIS — M25512 Pain in left shoulder: Secondary | ICD-10-CM

## 2017-08-18 DIAGNOSIS — M25511 Pain in right shoulder: Secondary | ICD-10-CM

## 2017-08-18 DIAGNOSIS — M25551 Pain in right hip: Secondary | ICD-10-CM

## 2017-08-19 ENCOUNTER — Encounter: Payer: Self-pay | Admitting: Physical Therapy

## 2017-08-19 NOTE — Therapy (Addendum)
[facility] Outpatient Rehabilitation [facility] [address] [place], [place], [address] Phone: [phone]   Fax:  [phone]  Physical Therapy Treatment  Patient Details  Name: Kathleen [name] MRN: [id number] Date of Birth: 1965-08-22 Referring Provider: Dr Kellie Simmering    Encounter Date: 08/18/2017  PT End of Session - 08/19/17 0846    Visit Number  2    Number of Visits  4    Date for PT Re-Evaluation  09/04/17    Authorization Type  Medicaid     Authorization Time Period  3 visits until 5/5     PT Start Time  1415    PT Stop Time  1458    PT Time Calculation (min)  43 min    Activity Tolerance  Patient limited by pain    Behavior During Therapy  [facility] for tasks assessed/performed       Past Medical History:  Diagnosis Date  . Anxiety   . Asthma   . Chronic back pain   . Cocaine-induced mental disorder (HCC)    psychosis  . COPD (chronic obstructive pulmonary disease) (HCC)   . Diabetes mellitus 01/03/2013   Insulin Dependent  . Headache(784.0)   . Hypertension   . Neuropathy   . Post traumatic stress disorder   . Schizoaffective disorder, bipolar type (HCC)   . Seizures (HCC) 01/03/2013   Most recent 12/31/2012, per pt report.    Past Surgical History:  Procedure Laterality Date  . ABDOMINAL HYSTERECTOMY      There were no vitals filed for this visit.  Subjective Assessment - 08/18/17 1418    Subjective  Patient reports everything is progressing but she is still in pain. She is still in a cervial collar. She feels like her back is a little better. She has been to the MD who did an x-ray on her neck. She was advised to remainin the collar for now. No order recieved to workon her neck.     Limitations  Sitting;Lifting;House hold activities;Walking;Standing    How long can you sit comfortably?  15 mins     How long can you stand comfortably?  difficulty standing     How long can you walk comfortably?  15 mins     Patient Stated Goals   dressing; makeup; exercising; house chores     Currently in Pain?  Yes    Pain Score  5     Pain Location  Back    Pain Orientation  Posterior    Pain Descriptors / Indicators  Aching;Stabbing    Pain Type  Acute pain    Pain Radiating Towards  left shoulder     Pain Onset  More than a month ago    Pain Frequency  Constant    Aggravating Factors   standing;walking; bending     Pain Relieving Factors  rest     Effect of Pain on Daily Activities  unable to reach overhead     Multiple Pain Sites  No    Pain Score  8    Pain Location  Neck    Pain Orientation  Right    Pain Descriptors / Indicators  Aching    Pain Type  Acute pain    Pain Onset  More than a month ago    Pain Frequency  Constant    Aggravating Factors   standing; changing positions; prolonged sitting     Pain Relieving Factors  ice     Effect of Pain on  Daily Activities  difficulty with ADL's             PT treatment Manual Therapy: soft tissue mobilization to upper gluteal and lower lumbar spine in side lying; LAD to left lower extremity Manual roll out   Exercises: Piriforms stretch 3x20 sec hold bilateral  Lateral trunk rotation 3x20 sec hold Abdominal breathing x10 5 sec hod Ball squeeze with abdominal breathing 2x10                         PT Education - 08/19/17 0844    Education provided  Yes    Education Details  reviewed HEP and symptom mangment     Person(s) Educated  Patient    Methods  Explanation;Demonstration;Tactile cues;Verbal cues    Comprehension  Verbalized understanding;Returned demonstration;Verbal cues required;Tactile cues required;Need further instruction       PT Short Term Goals - 08/07/17 1227      PT SHORT TERM GOAL #1   Title  Patient will increase bilateral cervical rotation to 35 degrees     Baseline  right 15 left 18     Time  3    Period  Weeks    Status  New    Target Date  08/28/17      PT SHORT TERM GOAL #2   Title  Patient will increase  bilateral active shoulder flexion to 80 degrees     Baseline  30 right 40 left     Time  3    Period  Weeks    Target Date  08/28/17      PT SHORT TERM GOAL #3   Title  Patient will increase lumbar flexion by 50 %     Baseline  75-100% limited at this time     Time  3    Period  Weeks    Status  New    Target Date  08/28/17      PT SHORT TERM GOAL #4   Title  Patient will be idependent with basic HEP to improve gross movement     Baseline  No HEP/ significant limitations in movement     Time  3    Period  Weeks    Status  New    Target Date  08/28/17               Plan - 08/19/17 0847    Clinical Impression Statement  Patient feel like she is making progress. She liked the exercises and reported it nmade her back gfeel better. Therapy focused on her back this visit. She reported the distraction and hip mobilizations felt good as well. she had improved hip flexion after mobilizations. She was given an updated HEP.     Clinical Presentation  Evolving    Clinical Decision Making  Moderate    Rehab Potential  Good    PT Frequency  2x / week    PT Duration  6 weeks    PT Treatment/Interventions  ADLs/Self Care Home Management;Therapeutic exercise;Therapeutic activities;Electrical Stimulation;Iontophoresis 4mg /ml Dexamethasone;Neuromuscular re-education;Patient/family education;Manual techniques;Splinting;Taping;Passive range of motion;Traction;Ultrasound;Moist Heat;Gait training;Functional mobility training    PT Next Visit Plan  Shoulder PROM; Hip PROM; Cervical PROM; Pelvic tilts; TrA breathing     PT Home Exercise Plan  Scapular retractions, Wand assisted chest press in supine; Lumbar rotations     Consulted and Agree with Plan of Care  Patient       Patient will benefit from skilled therapeutic intervention  in order to improve the following deficits and impairments:  Pain, Decreased strength, Difficulty walking, Increased muscle spasms, Decreased range of motion, Impaired  UE functional use, Decreased activity tolerance  Visit Diagnosis: Acute bilateral low back pain without sciatica  Cervicalgia  Acute pain of left shoulder  Acute pain of right shoulder  Pain in right hip     Problem List Patient Active Problem List   Diagnosis Date Noted  . Cocaine use disorder, moderate, dependence (HCC) 09/11/2014  . Schizoaffective disorder, bipolar type (HCC) 09/10/2014  . HBP (high blood pressure) 03/29/2013  . Insulin dependent type 2 diabetes mellitus, uncontrolled (HCC) 01/06/2013  . PTSD (post-traumatic stress disorder) 06/24/2011    Class: Chronic    Dessie Comaavid J Sabien Umland PT DPT  08/19/2017, 8:52 AM  [facility] [address] [place], [place], [address] Phone: [phone]   Fax:  [phone]  Name: Sylvester HarderKaren C Cressey MRN: [id number] Date of Birth: 1966/03/30

## 2017-08-25 ENCOUNTER — Ambulatory Visit: Payer: Medicaid Other | Admitting: Physical Therapy

## 2017-08-25 ENCOUNTER — Encounter: Payer: Self-pay | Admitting: Physical Therapy

## 2017-08-25 DIAGNOSIS — M545 Low back pain, unspecified: Secondary | ICD-10-CM

## 2017-08-25 DIAGNOSIS — M542 Cervicalgia: Secondary | ICD-10-CM

## 2017-08-25 DIAGNOSIS — M25512 Pain in left shoulder: Secondary | ICD-10-CM

## 2017-08-25 DIAGNOSIS — M25551 Pain in right hip: Secondary | ICD-10-CM

## 2017-08-25 DIAGNOSIS — M25511 Pain in right shoulder: Secondary | ICD-10-CM

## 2017-08-25 NOTE — Therapy (Signed)
[facility] Outpatient Rehabilitation [facility] [address] [place], [place], [address] Phone: [phone]   Fax:  [phone]  Physical Therapy Treatment  Patient Details  Name: Kathleen [name] MRN: [id number] Date of Birth: [birth year] Referring Provider: Dr Kellie Simmering    Encounter Date: 08/25/2017  PT End of Session - 08/25/17 1654    Visit Number  3    Number of Visits  4    Date for PT Re-Evaluation  09/04/17    Authorization Type  Medicaid     Authorization Time Period  3 visits until 5/5     PT Start Time  0217    PT Stop Time  0300    PT Time Calculation (min)  43 min    Activity Tolerance  Patient limited by pain    Behavior During Therapy  [facility] for tasks assessed/performed       Past Medical History:  Diagnosis Date  . Anxiety   . Asthma   . Chronic back pain   . Cocaine-induced mental disorder (HCC)    psychosis  . COPD (chronic obstructive pulmonary disease) (HCC)   . Diabetes mellitus 01/03/2013   Insulin Dependent  . Headache(784.0)   . Hypertension   . Neuropathy   . Post traumatic stress disorder   . Schizoaffective disorder, bipolar type (HCC)   . Seizures (HCC) 01/03/2013   Most recent 12/31/2012, per pt report.    Past Surgical History:  Procedure Laterality Date  . ABDOMINAL HYSTERECTOMY      There were no vitals filed for this visit.  Subjective Assessment - 08/25/17 1424    Subjective  Patient reports her lower back pain has been increasing. She is getting to the point now where she feels like her right leg is giving out. She is out of her ccervical coller. She reports increased pain in her neck now that she is out of her brace.     Limitations  Sitting;Lifting;House hold activities;Walking;Standing    How long can you sit comfortably?  15 mins     How long can you stand comfortably?  difficulty standing     How long can you walk comfortably?  15 mins     Patient Stated Goals  dressing; makeup; exercising; house chores      Pain Score  9     Pain Location  Neck    Pain Orientation  Posterior    Pain Descriptors / Indicators  Aching;Stabbing    Pain Type  Acute pain    Pain Radiating Towards  into left shoulder     Pain Onset  More than a month ago    Pain Frequency  Constant    Aggravating Factors   standing, walking, bending     Pain Relieving Factors  rest     Effect of Pain on Daily Activities  unable to reach overhead     Multiple Pain Sites  No    Pain Score  9    Pain Location  Back    Pain Orientation  Right    Pain Descriptors / Indicators  Aching    Pain Type  Acute pain    Pain Onset  More than a month ago    Pain Frequency  Constant    Aggravating Factors   standing and changing positions     Pain Relieving Factors  ice     Effect of Pain on Daily Activities  difficulty with ADL's  [facility] Adult PT Treatment/Exercise - 08/25/17 0001      Self-Care   Self-Care  Posture    Posture  reviewed the improtance of proper postrue and reviewed what proper posture is.       Lumbar Exercises: Stretches   Passive Hamstring Stretch Limitations  seated hamstring stretch 3x20 sec hold with cuing for posture       Lumbar Exercises: Seated   Other Seated Lumbar Exercises  ball squeeze wih abdominal breathing 2x10     Other Seated Lumbar Exercises  hip abdcution with clamshell yellow  2x10 with abdominal breathing       Lumbar Exercises: Supine   Other Supine Lumbar Exercises  Lumbar rotations x10      Shoulder Exercises: Seated   Other Seated Exercises  Scap Pinches x10      Manual Therapy   Manual Therapy  Soft tissue mobilization;Myofascial release;Joint mobilization    Joint Mobilization  LAD to left lower extremity     Soft tissue mobilization  roller to lateral hip and glut and lumbar spine              PT Education - 08/25/17 1654    Education provided  Yes    Education Details  reviewed HEP     Person(s) Educated  Patient    Methods   Explanation;Demonstration;Tactile cues;Verbal cues    Comprehension  Verbalized understanding;Returned demonstration;Verbal cues required;Tactile cues required;Need further instruction       PT Short Term Goals - 08/07/17 1227      PT SHORT TERM GOAL #1   Title  Patient will increase bilateral cervical rotation to 35 degrees     Baseline  right 15 left 18     Time  3    Period  Weeks    Status  New    Target Date  08/28/17      PT SHORT TERM GOAL #2   Title  Patient will increase bilateral active shoulder flexion to 80 degrees     Baseline  30 right 40 left     Time  3    Period  Weeks    Target Date  08/28/17      PT SHORT TERM GOAL #3   Title  Patient will increase lumbar flexion by 50 %     Baseline  75-100% limited at this time     Time  3    Period  Weeks    Status  New    Target Date  08/28/17      PT SHORT TERM GOAL #4   Title  Patient will be idependent with basic HEP to improve gross movement     Baseline  No HEP/ significant limitations in movement     Time  3    Period  Weeks    Status  New    Target Date  08/28/17               Plan - 08/25/17 1656    Clinical Impression Statement  Patient was limited by pain today but she was able to complete all exercises. Therapy will contact the MD about her neck. She was given a seated hamstring stretch and a seated clamshell for home exercises. She is motivated to get stronger and to move better.    Clinical Presentation  Evolving    Clinical Decision Making  Moderate    Rehab Potential  Good    PT Frequency  2x / week  PT Duration  6 weeks    PT Treatment/Interventions  ADLs/Self Care Home Management;Therapeutic exercise;Therapeutic activities;Electrical Stimulation;Iontophoresis 4mg /ml Dexamethasone;Neuromuscular re-education;Patient/family education;Manual techniques;Splinting;Taping;Passive range of motion;Traction;Ultrasound;Moist Heat;Gait training;Functional mobility training    PT Next Visit Plan   Shoulder PROM; Hip PROM; Cervical PROM; Pelvic tilts; TrA breathing     PT Home Exercise Plan  Scapular retractions, Wand assisted chest press in supine; Lumbar rotations     Consulted and Agree with Plan of Care  Patient       Patient will benefit from skilled therapeutic intervention in order to improve the following deficits and impairments:  Pain, Decreased strength, Difficulty walking, Increased muscle spasms, Decreased range of motion, Impaired UE functional use, Decreased activity tolerance  Visit Diagnosis: Acute bilateral low back pain without sciatica  Cervicalgia  Acute pain of left shoulder  Acute pain of right shoulder  Pain in right hip     Problem List Patient Active Problem List   Diagnosis Date Noted  . Cocaine use disorder, moderate, dependence (HCC) 09/11/2014  . Schizoaffective disorder, bipolar type (HCC) 09/10/2014  . HBP (high blood pressure) 03/29/2013  . Insulin dependent type 2 diabetes mellitus, uncontrolled (HCC) 01/06/2013  . PTSD (post-traumatic stress disorder) 06/24/2011    Class: Chronic    Dessie Coma PT DPT  08/25/2017, 5:00 PM  [facility] [address] [place], [place], [address] Phone: [phone]   Fax:  [phone]  Name: RAMESHA POSTER MRN: [id number] Date of Birth: 12/19/1965

## 2017-09-01 ENCOUNTER — Encounter: Payer: Self-pay | Admitting: Physical Therapy

## 2017-09-01 ENCOUNTER — Ambulatory Visit: Payer: Medicaid Other | Admitting: Physical Therapy

## 2017-09-01 DIAGNOSIS — M25551 Pain in right hip: Secondary | ICD-10-CM

## 2017-09-01 DIAGNOSIS — M545 Low back pain, unspecified: Secondary | ICD-10-CM

## 2017-09-01 DIAGNOSIS — M542 Cervicalgia: Secondary | ICD-10-CM

## 2017-09-01 DIAGNOSIS — M25512 Pain in left shoulder: Secondary | ICD-10-CM

## 2017-09-01 DIAGNOSIS — M25511 Pain in right shoulder: Secondary | ICD-10-CM
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
## 2017-09-08 ENCOUNTER — Ambulatory Visit (HOSPITAL_COMMUNITY)
Admission: EM | Admit: 2017-09-08 | Discharge: 2017-09-08 | Disposition: A | Discharge status: Home / Self Care | Payer: Medicaid Other | Attending: Family Medicine | Admitting: Family Medicine

## 2017-09-08 ENCOUNTER — Ambulatory Visit: Payer: Medicaid Other | Attending: Family Medicine | Admitting: Physical Therapy

## 2017-09-08 ENCOUNTER — Other Ambulatory Visit: Payer: Self-pay

## 2017-09-08 ENCOUNTER — Encounter: Payer: Self-pay | Admitting: Physical Therapy

## 2017-09-08 ENCOUNTER — Emergency Department (HOSPITAL_COMMUNITY)
Admission: EM | Admit: 2017-09-08 | Discharge: 2017-09-08 | Disposition: A | Discharge status: Home / Self Care | Payer: No Typology Code available for payment source

## 2017-09-08 ENCOUNTER — Encounter (HOSPITAL_COMMUNITY): Payer: Self-pay | Admitting: Emergency Medicine

## 2017-09-08 DIAGNOSIS — M79604 Pain in right leg: Secondary | ICD-10-CM

## 2017-09-08 DIAGNOSIS — M25511 Pain in right shoulder: Secondary | ICD-10-CM | POA: Diagnosis present

## 2017-09-08 DIAGNOSIS — M25512 Pain in left shoulder: Secondary | ICD-10-CM

## 2017-09-08 DIAGNOSIS — M542 Cervicalgia: Secondary | ICD-10-CM | POA: Diagnosis present

## 2017-09-08 DIAGNOSIS — M545 Low back pain, unspecified: Secondary | ICD-10-CM

## 2017-09-08 DIAGNOSIS — M25551 Pain in right hip: Secondary | ICD-10-CM

## 2017-09-08 MED ORDER — ACETAMINOPHEN 325 MG PO TABS: 650.0000 mg | ORAL_TABLET | Freq: Four times a day (QID) | ORAL | 0 refills | Status: AC | PRN

## 2017-09-08 NOTE — Therapy (Signed)
[facility] [address] [place], [place], [address] Phone: [phone]   Fax:  [phone]  Physical Therapy Treatment  Patient Details  Name: Kathleen [name] MRN: [id number] Date of Birth: [birth year] Referring Provider: Dr Kellie Simmering    Encounter Date: 09/08/2017  PT End of Session - 09/08/17 0931    Visit Number  5    Number of Visits  14    Date for PT Re-Evaluation  10/09/17    Authorization Type  Medicaid     Authorization Time Period  5/10     PT Start Time  0920    PT Stop Time  1000    PT Time Calculation (min)  40 min    Activity Tolerance  Treatment limited secondary to medical complications (Comment)    Behavior During Therapy  [facility] for tasks assessed/performed       Past Medical History:  Diagnosis Date  . Anxiety   . Asthma   . Chronic back pain   . Cocaine-induced mental disorder (HCC)    psychosis  . COPD (chronic obstructive pulmonary disease) (HCC)   . Diabetes mellitus 01/03/2013   Insulin Dependent  . Headache(784.0)   . Hypertension   . Neuropathy   . Post traumatic stress disorder   . Schizoaffective disorder, bipolar type (HCC)   . Seizures (HCC) 01/03/2013   Most recent 12/31/2012, per pt report.    Past Surgical History:  Procedure Laterality Date  . ABDOMINAL HYSTERECTOMY      There were no vitals filed for this visit.  Subjective Assessment - 09/08/17 0927    Subjective  Patint was standing up this morning trying to put her pants on and she fell and hurt her right leg. She fell on the right hip. She also hit her head. She will be going to the MD after this treatment.  She declined treatment of her right leg but would like for [place] to work on her neck and shoulders.     Limitations  Sitting;Lifting;House hold activities;Walking;Standing    How long can you sit comfortably?  15 mins     How long can you stand comfortably?  difficulty standing     How long can you walk comfortably?  15  mins     Patient Stated Goals  dressing; makeup; exercising; house chores     Currently in Pain?  Yes    Pain Score  8     Pain Location  Neck    Pain Orientation  Posterior    Pain Descriptors / Indicators  Aching    Pain Type  Acute pain    Pain Radiating Towards  into the left shoulder     Pain Onset  More than a month ago    Pain Frequency  Constant    Aggravating Factors   turning her head    Pain Relieving Factors  rest     Effect of Pain on Daily Activities  unable to use her shoulders     Pain Score  9    Pain Location  Hip    Pain Orientation  Right    Pain Descriptors / Indicators  Aching    Pain Type  Chronic pain    Pain Radiating Towards  lateral hip and knee     Pain Onset  More than a month ago    Pain Frequency  Constant    Aggravating Factors   standing and chnaging position     Pain Relieving  Factors  ice     Effect of Pain on Daily Activities  difficulty with ADL';s                        [facility] Adult PT Treatment/Exercise - 09/08/17 0001      Lumbar Exercises: Seated   Other Seated Lumbar Exercises  bilateral ER red; supine horrizontal abduction 2x10       Lumbar Exercises: Supine   Other Supine Lumbar Exercises  Lumbar rotations x10      Shoulder Exercises: Supine   Other Supine Exercises  supine bilateral ER red 2x10; supine horrizontal abduction 3x5;       Shoulder Exercises: Standing   Other Standing Exercises  shouldr extnesion 2x10 red; scap reatraction 2x10 red       Moist Heat Therapy   Number Minutes Moist Heat  -- declined because of the bus       Manual Therapy   Soft tissue mobilization  to upper traps and thoracic spine with IATYM; supine to paraspinals             PT Education - 09/08/17 0930    Education provided  Yes    Education Details  reviewed HEP for neck and posture     Person(s) Educated  Patient    Methods  Explanation;Demonstration;Tactile cues;Verbal cues    Comprehension  Verbalized  understanding;Returned demonstration;Verbal cues required;Tactile cues required       PT Short Term Goals - 09/08/17 1216      PT SHORT TERM GOAL #1   Title  Patient will increase bilateral cervical rotation to 35 degrees     Baseline  right 40 left 38    Time  3    Period  Weeks    Status  Achieved      PT SHORT TERM GOAL #2   Title  Patient will increase bilateral active shoulder flexion to 80 degrees     Baseline  right 72 left 70     Time  3    Period  Weeks    Status  On-going      PT SHORT TERM GOAL #3   Title  Patient will increase lumbar flexion by 50 %     Baseline  75-100% limited at this time     Time  3    Period  Weeks    Status  On-going      PT SHORT TERM GOAL #4   Title  Patient will be idependent with basic HEP to improve gross movement     Baseline  patient is currently working on HEP with help from her daughter     Time  3    Period  Weeks    Status  On-going        PT Long Term Goals - 09/01/17 1513      PT LONG TERM GOAL #1   Title  Patient will increase bilateral cervical rotation to 60 degrees bilateral in order to increase safety when driving     Time  5    Period  Weeks    Status  New    Target Date  10/13/17      PT LONG TERM GOAL #2   Title  Patient will sit for 30 minutes without increased pain in order to perfrom ADL's     Baseline  > 10 minutes before pain increase    Time  5    Period  Weeks  Status  New    Target Date  10/13/17      PT LONG TERM GOAL #3   Title  Patient will increase active shoulder flexion to 105 degrees in order to reach up into cabinets to perfrom IADL's     Baseline  75 degrees left 70 degrees left      Time  5    Period  Weeks    Status  New    Target Date  10/13/17      PT LONG TERM GOAL #4   Title  Patient will ambulate 3000' without increased pain in order to walk in her neighborhood for exercise     Time  5    Period  Weeks    Status  New    Target Date  10/06/17            Plan -  09/08/17 1012    Clinical Impression Statement  Patient reports that she feels like she canlook down and look up better. She declined treatment of her hip and back today. Therapy insteady foucsed on improving use of her shoulders. She was able to tolerate exercises well today. Therapy continues to focus on postural streghtening as well as soft tissue mobilization to reduce pain.     Clinical Presentation  Evolving    Clinical Decision Making  Moderate    PT Frequency  2x / week    PT Duration  6 weeks    PT Treatment/Interventions  ADLs/Self Care Home Management;Therapeutic exercise;Therapeutic activities;Electrical Stimulation;Iontophoresis /ml Dexamethasone;Neuromuscular re-education;Patient/family education;Manual techniques;Splinting;Taping;Passive range of motion;Traction;Ultrasound;Moist Heat;Gait training;Functional mobility training    PT Next Visit Plan  Shoulder PROM; Hip PROM; Cervical PROM; Pelvic tilts; TrA breathing     PT Home Exercise Plan  Scapular retractions, Wand assisted chest press in supine; Lumbar rotations     Consulted and Agree with Plan of Care  Patient       Patient will benefit from skilled therapeutic intervention in order to improve the following deficits and impairments:     Visit Diagnosis: Acute bilateral low back pain without sciatica  Cervicalgia  Acute pain of left shoulder  Acute pain of right shoulder  Pain in right hip     Problem List Patient Active Problem List   Diagnosis Date Noted  . Cocaine use disorder, moderate, dependence (HCC) 09/11/2014  . Schizoaffective disorder, bipolar type (HCC) 09/10/2014  . HBP (high blood pressure) 03/29/2013  . Insulin dependent type 2 diabetes mellitus, uncontrolled (HCC) 01/06/2013  . PTSD (post-traumatic stress disorder) 06/24/2011    Class: Chronic    Dessie Coma PT DPT  09/08/2017, 12:18 PM Elisabeth Pigeon SPT  09/08/3017  During this treatment session, the therapist was present,  participating in and directing the treatment.  [facility] [address] [place], [place], [address] Phone: [phone]   Fax:  [phone]  Name: Kathleen [name] MRN: [id number] Date of Birth: 02-06-1966
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
## 2017-09-08 NOTE — Discharge Instructions (Signed)
Please use immobilizer and crutches as needed  Please continue to go to PT  If symptoms not improving please follow up here or with PCP

## 2017-09-10 ENCOUNTER — Encounter: Payer: Self-pay | Admitting: Physical Therapy

## 2017-09-10 ENCOUNTER — Ambulatory Visit: Payer: Medicaid Other | Admitting: Physical Therapy

## 2017-09-10 DIAGNOSIS — M25551 Pain in right hip: Secondary | ICD-10-CM

## 2017-09-10 DIAGNOSIS — M542 Cervicalgia: Secondary | ICD-10-CM

## 2017-09-10 DIAGNOSIS — M545 Low back pain, unspecified: Secondary | ICD-10-CM

## 2017-09-10 DIAGNOSIS — M25511 Pain in right shoulder: Secondary | ICD-10-CM

## 2017-09-10 DIAGNOSIS — M25512 Pain in left shoulder: Secondary | ICD-10-CM
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
## 2017-09-16 ENCOUNTER — Ambulatory Visit: Payer: Medicaid Other | Admitting: Physical Therapy

## 2017-09-16 DIAGNOSIS — M545 Low back pain, unspecified: Secondary | ICD-10-CM

## 2017-09-16 DIAGNOSIS — M25511 Pain in right shoulder: Secondary | ICD-10-CM

## 2017-09-16 DIAGNOSIS — M25551 Pain in right hip: Secondary | ICD-10-CM

## 2017-09-16 DIAGNOSIS — M25512 Pain in left shoulder: Secondary | ICD-10-CM

## 2017-09-16 DIAGNOSIS — M542 Cervicalgia: Secondary | ICD-10-CM

## 2017-09-16 NOTE — Therapy (Addendum)
[place], [place], [address] Phone: [phone]   Fax:  [phone]  Physical Therapy Treatment/ Discharge   Patient Details  Name: Kathleen [name] MRN: [id number] Date of Birth: 20-Apr-1966 Referring Provider: Dr Joannie [place]    Encounter Date: 09/16/2017  PT End of Session - 09/16/17 0942    Visit Number  7    Number of Visits  14    Date for PT Re-Evaluation  10/09/17    Authorization Type  Medicaid     Authorization Time Period  09/07/2017-10/11/2017    Authorization - Visit Number  3    Authorization - Number of Visits  10    PT Start Time  0930    PT Stop Time  1010    PT Time Calculation (min)  40 min       Past Medical History:  Diagnosis Date  . Anxiety   . Asthma   . Chronic back pain   . 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 COPD (chronic obstructive pulmonary disease) ([place])   . Diabetes mellitus 01/03/2013   Insulin Dependent  . Headache(784.0)   . Hypertension   . Neuropathy   . Post traumatic stress disorder   . Schizoaffective disorder, bipolar type ([place])   . Seizures ([place]) 01/03/2013   Most recent 12/31/2012, per pt report.    Past Surgical History:  Procedure Laterality Date  . ABDOMINAL HYSTERECTOMY      There were no vitals filed for this visit.  Subjective Assessment - 09/16/17 0939    Currently in Pain?  Yes    Pain Score  8     Pain Location  Neck    Pain Radiating Towards  into left shoulder     Pain Score  8    Pain Location  Back    Pain Orientation  Upper;Mid;Lower    Pain Descriptors / Indicators  Spasm                       [facility] Adult PT Treatment/Exercise - 09/16/17 0001      Lumbar Exercises: Aerobic   Nustep  L1 UE/LE x 5 minutes       Shoulder Exercises: Seated   Other Seated Exercises  Scap Pinches x10      Shoulder Exercises: Prone   Retraction  10 reps    Retraction Limitations  with tactile cues        Manual Therapy   Soft tissue mobilization  IASTM prone to paraspinals and upper traps                PT Short Term Goals - 09/16/17 0941      PT SHORT TERM GOAL #1   Title  Patient will increase bilateral cervical rotation to 35 degrees     Baseline  right 40 left 38    Time  3    Period  Weeks    Status  Achieved      PT SHORT TERM GOAL #2   Title  Patient will increase bilateral active shoulder flexion to 80 degrees     Baseline  bilateral 110    Time  3    Period  Weeks    Status  Achieved      PT SHORT TERM GOAL #3   Title  Patient will increase lumbar flexion by 50 %     Baseline  75-100% limited at this time  Time  3    Period  Weeks    Status  Unable to assess      PT SHORT TERM GOAL #4   Title  Patient will be idependent with basic HEP to improve gross movement     Baseline  patient is currently working on HEP with help from her daughter     Time  3    Period  Weeks    Status  On-going        PT Long Term Goals - 09/01/17 1513      PT LONG TERM GOAL #1   Title  Patient will increase bilateral cervical rotation to 60 degrees bilateral in order to increase safety when driving     Time  5    Period  Weeks    Status  New    Target Date  10/13/17      PT LONG TERM GOAL #2   Title  Patient will sit for 30 minutes without increased pain in order to perfrom ADL's     Baseline  > 10 minutes before pain increase    Time  5    Period  Weeks    Status  New    Target Date  10/13/17      PT LONG TERM GOAL #3   Title  Patient will increase active shoulder flexion to 105 degrees in order to reach up into cabinets to perfrom IADL's     Baseline  75 degrees left 70 degrees left      Time  5    Period  Weeks    Status  New    Target Date  10/13/17      PT LONG TERM GOAL #4   Title  Patient will ambulate 3000' without increased pain in order to walk in her neighborhood for exercise     Time  5    Period  Weeks    Status  New    Target Date  10/06/17             Plan - 09/16/17 1005    Clinical Impression Statement  Pt arrives expressing concern about transportation 2 x per week. She requests to decrease frequency to 1 x per week. Her knee is better. She is ambulating without imobilizer or AD. She reports severe back spasms over the last 2 days. She does not have a heating pad and it was recommended that she try to purchase one for home. Pt cannot lie on her back so she was placed in prone for IASTM to paraspinsals and upper traps. She has increased pain with transition on and off the mat table. In sitting she requires max cues to obtain proper sitting posture. Reviewed and reprinted scap squeeze for HEP. Trial of Nustep with good tolerance. She reports feeling looser at end of session. Her shoulder flexion has improved to 110 degrees bilateral. STG#2 met.     PT Next Visit Plan  Shoulder PROM; Hip PROM; Cervical PROM; Pelvic tilts; TrA breathing ; use HMP; FOTO, try pulleys, continue Nustep     Consulted and Agree with Plan of Care  Patient       Patient will benefit from skilled therapeutic intervention in order to improve the following deficits and impairments:  Pain, Decreased strength, Difficulty walking, Increased muscle spasms, Decreased range of motion, Impaired UE functional use, Decreased activity tolerance  Visit Diagnosis: Acute bilateral low back pain without sciatica  Cervicalgia  Acute pain of left shoulder  Acute pain of right shoulder  Pain in right hip    PHYSICAL THERAPY DISCHARGE SUMMARY  Visits from Start of Care: 7  Current functional level related to goals / functional outcomes: Continues to have pain    Remaining deficits: Neck and Back Pain    Education / Equipment: HEP Plan: Patient agrees to discharge.  Patient goals were not met. Patient is being discharged due to the physician's request.  ?????      Problem List Patient Active Problem List   Diagnosis Date Noted  . Cocaine use  disorder, moderate, dependence ([place]) 09/11/2014  . Schizoaffective disorder, bipolar type ([place]) 09/10/2014  . HBP (high blood pressure) 03/29/2013  . Insulin dependent type 2 diabetes mellitus, uncontrolled ([place]) 01/06/2013  . PTSD (post-traumatic stress disorder) 06/24/2011    Class: Chronic    Dorene Ar, PTA 09/16/2017, 10:19 AM  [facility] [address] [place], [place], [address] Phone: [phone]   Fax:  [phone]  Name: Kathleen [name] MRN: [id number] Date of Birth: [birth year]

## 2017-09-17 ENCOUNTER — Encounter: Payer: Self-pay | Admitting: Physical Therapy

## 2017-09-22 ENCOUNTER — Ambulatory Visit: Payer: Medicaid Other | Admitting: Physical Therapy

## 2017-09-24 ENCOUNTER — Ambulatory Visit: Payer: Medicaid Other | Admitting: Physical Therapy

## 2017-09-30 ENCOUNTER — Ambulatory Visit: Payer: Medicaid Other | Admitting: Physical Therapy

## 2017-10-02 ENCOUNTER — Encounter: Payer: Self-pay | Admitting: Physical Therapy

## 2017-10-06 ENCOUNTER — Encounter: Payer: Self-pay | Admitting: Physical Therapy

## 2017-10-08 ENCOUNTER — Encounter: Payer: Self-pay | Admitting: Physical Therapy

## 2017-11-03 ENCOUNTER — Ambulatory Visit: Payer: Medicaid Other | Attending: Family Medicine | Admitting: Physical Therapy

## 2017-11-03 ENCOUNTER — Encounter: Payer: Self-pay | Admitting: Physical Therapy

## 2017-11-03 ENCOUNTER — Other Ambulatory Visit: Payer: Self-pay

## 2017-11-03 DIAGNOSIS — G8929 Other chronic pain: Secondary | ICD-10-CM

## 2017-11-03 DIAGNOSIS — R293 Abnormal posture: Secondary | ICD-10-CM | POA: Insufficient documentation

## 2017-11-03 DIAGNOSIS — M545 Low back pain, unspecified: Secondary | ICD-10-CM

## 2017-11-03 DIAGNOSIS — M5386 Other specified dorsopathies, lumbar region: Secondary | ICD-10-CM | POA: Insufficient documentation
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
## 2017-11-12 ENCOUNTER — Ambulatory Visit: Payer: Medicaid Other | Admitting: Physical Therapy

## 2017-11-12 ENCOUNTER — Encounter: Payer: Self-pay | Admitting: Physical Therapy

## 2017-11-12 DIAGNOSIS — M545 Low back pain, unspecified: Secondary | ICD-10-CM

## 2017-11-12 DIAGNOSIS — M5386 Other specified dorsopathies, lumbar region: Secondary | ICD-10-CM

## 2017-11-12 DIAGNOSIS — R293 Abnormal posture: Secondary | ICD-10-CM

## 2017-11-12 DIAGNOSIS — G8929 Other chronic pain: Secondary | ICD-10-CM

## 2017-11-12 NOTE — Patient Instructions (Addendum)
Strengthening: Hip Extension (Prone)    Tighten muscles on front of left thigh, then lift leg __1__ inches from surface, keeping knee locked. Repeat __5 to 10__ times per set.  Do _1-to - 3   Internal Rotation: ROM (Prone)    Position (A) Patient: Bend left knee to 90. Helper: Stabilize buttock. Other hand grasps ankle. Motion (B) - Move ankle away from opposite leg, keeping knee in place. CAUTION: Do not twist knee. Stop at point of tension. Repeat __10_ times. Repeat with other leg. Do 1 to 3 ___ sessions per day.  Copyright  VHI. All rights reserved.    http://orth.exer.us/621   Copyright  VHI. All rights reserved.  Leg Extension (Hamstring)    Sit toward front edge of chair, with leg out straight, heel on floor, toes pointing toward body. Keeping back straight, lean forward until yoe get a gentle stretch. Return, breathing in. Repeat __3_ times. Repeat with other leg. Do __1_ sessions per day. Variation: Perform from standing position, with support.  Copyright  VHI. All rights reserved.  Quadratus lumborum stretches/  Info issued from exercise drawerAs needed 1 to 3  Reps 30 second holds.

## 2017-11-12 NOTE — Therapy (Signed)
[facility] [address] [place], [place], [address] Phone: [phone]   Fax:  [phone]  Physical Therapy Treatment  Patient Details  Name: Kathleen [name] MRN: [id number] Date of Birth: October 27, 1965 Referring Provider: Virl Axe, FNP   Encounter Date: 11/12/2017  PT End of Session - 11/12/17 1248    Visit Number  2    Number of Visits  13    Date for PT Re-Evaluation  10/09/17    Authorization - Visit Number  4    Authorization - Number of Visits  10    PT Start Time  1148    PT Stop Time  1233    PT Time Calculation (min)  45 min    Activity Tolerance  Patient tolerated treatment well    Behavior During Therapy  [facility] for tasks assessed/performed       Past Medical History:  Diagnosis Date  . Anxiety   . Asthma   . Chronic back pain   . Cocaine-induced mental disorder (HCC)    psychosis  . COPD (chronic obstructive pulmonary disease) (HCC)   . Diabetes mellitus 01/03/2013   Insulin Dependent  . Headache(784.0)   . Hypertension   . Neuropathy   . Post traumatic stress disorder   . Schizoaffective disorder, bipolar type (HCC)   . Seizures (HCC) 01/03/2013   Most recent 12/31/2012, per pt report.    Past Surgical History:  Procedure Laterality Date  . ABDOMINAL HYSTERECTOMY      There were no vitals filed for this visit.  Subjective Assessment - 11/12/17 1155    Subjective  Needs help to get dressed.  Exercises hurt    Currently in Pain?  Yes    Pain Score  9     Pain Location  Back    Pain Orientation  Lower    Pain Descriptors / Indicators  Sharp;Constant    Pain Frequency  Constant    Aggravating Factors   exercise walking    Pain Relieving Factors  meds  sidelying in recliner, heating pad    Effect of Pain on Daily Activities  cant sleep ,  cant walk,  needs help to get dressed                       [facility] Adult PT Treatment/Exercise - 11/12/17 0001      Self-Care   Self-Care   Other Self-Care Comments    Other Self-Care Comments   scoot to side and swing hips for sit to stand,  Anatomy hips/  back tightness      Lumbar Exercises: Stretches   Active Hamstring Stretch  2 reps;10 seconds on edge of mat better than strap    Active Hamstring Stretch Limitations  also stretched hamstring prone off edge of mat left    Hip Flexor Stretch  Left prone PROM 3 X 30    Other Lumbar Stretch Exercise  Quadratus lumborum stretch on side over pillow    Other Lumbar Stretch Exercise  sidelying  passive  PNF  posterior rotation emphasis.        Lumbar Exercises: Prone   Other Prone Lumbar Exercises  gluteal set 10 x  Feels good      Knee/Hip Exercises: Stretches   Piriformis Stretch  1 rep;60 seconds from side    Other Knee/Hip Stretches  prone hip IR/stretch 3 x 30 helped pain bilaterally  HEP      Manual Therapy   Soft  tissue mobilization  piriformis soft tissue work to decrease spasm,  paraspinals  relaxed             PT Education - 11/12/17 1248    Education provided  Yes    Education Details  anatomy,  HEP    Person(s) Educated  Patient    Methods  Demonstration;Tactile cues;Verbal cues;Handout;Explanation    Comprehension  Verbalized understanding;Returned demonstration       PT Short Term Goals - 11/03/17 1251      PT SHORT TERM GOAL #1   Title  Pt will be I with HEP    Baseline  not performing exercises    Time  3    Period  Weeks    Status  New    Target Date  11/24/17      PT SHORT TERM GOAL #2   Title  Pt will demonstrate/verbalize correct posture to decrease pain to </= 3/10 to improve quality of life    Baseline  forward flexed posture, forward head, rounded shoulders    Time  3    Period  Weeks    Status  New    Target Date  11/24/17        PT Long Term Goals - 11/03/17 1256      PT LONG TERM GOAL #1   Title  Pt will increase lumbar flexion AROM to >/= 50 degree to improve ability to perform ADLs    Baseline  12 degrees    Time  6     Period  Weeks    Status  New    Target Date  12/15/17      PT LONG TERM GOAL #2   Title  Pt will increase lumbar AROM R and L sidebending and lumbar extension to >/= 20 degrees to improve ability to perform ADLs     Baseline  extension 6, R SB 8, L SB 4 degrees    Time  6    Period  Weeks    Status  New    Target Date  12/15/17      PT LONG TERM GOAL #3   Title  Pt will be able to stand & walk >/= 30 minutes with pain level </= 3/10 to improve functional mobility and community ambulation    Baseline  pt unable to stand and walk greater than 5 min    Time  6    Period  Weeks    Status  New    Target Date  12/15/17      PT LONG TERM GOAL #4   Title  Pt will be I with HEP by final visit to ensure progress after completion of physical therapy    Baseline  pt not performing exercises    Time  6    Period  Weeks    Status  New    Target Date  12/15/17            Plan - 11/12/17 1249    Clinical Impression Statement  Patient painful and tearful today  initially.  Progressed and modified HEP today .  Pain 7/10 at end of session.  She needed candy due to low sugar per report.    PT Home Exercise Plan  Emotional support given.  hamstring stretch with strap ( or sitting), lower trunk rotations, pelvic tilts, SLR ( not tolerated)  prone hip extension left,  Quadratus lumborum stretch,   Prone  bilateral hip IR stretch.    Consulted  and Agree with Plan of Care  Patient       Patient will benefit from skilled therapeutic intervention in order to improve the following deficits and impairments:     Visit Diagnosis: Chronic left-sided low back pain without sciatica  Abnormal posture  Decreased range of motion of lumbar spine  Acute bilateral low back pain without sciatica     Problem List Patient Active Problem List   Diagnosis Date Noted  . Cocaine use disorder, moderate, dependence (HCC) 09/11/2014  . Schizoaffective disorder, bipolar type (HCC) 09/10/2014  . HBP  (high blood pressure) 03/29/2013  . Insulin dependent type 2 diabetes mellitus, uncontrolled (HCC) 01/06/2013  . PTSD (post-traumatic stress disorder) 06/24/2011    Class: Chronic    HARRIS,Saleah  PTA 11/12/2017, 12:53 PM  [facility] [address] [place], [place], [address] Phone: [phone]   Fax:  [phone]  Name: Sylvester HarderKaren C Klimas MRN: [id number] Date of Birth: October 09, 1965

## 2017-11-17 ENCOUNTER — Encounter: Payer: Self-pay | Admitting: Physical Therapy

## 2017-11-17 ENCOUNTER — Ambulatory Visit: Payer: Medicaid Other | Admitting: Physical Therapy

## 2017-11-17 DIAGNOSIS — M545 Low back pain, unspecified: Secondary | ICD-10-CM

## 2017-11-17 DIAGNOSIS — R293 Abnormal posture: Secondary | ICD-10-CM

## 2017-11-17 DIAGNOSIS — M5386 Other specified dorsopathies, lumbar region: Secondary | ICD-10-CM

## 2017-11-17 DIAGNOSIS — G8929 Other chronic pain: Secondary | ICD-10-CM

## 2017-11-17 NOTE — Patient Instructions (Signed)
From ex drawer;  Multifitus prone First 3  Issued 10 x each hold 1 second

## 2017-11-17 NOTE — Therapy (Signed)
[place], [place], [address] Phone: [phone]   Fax:  [phone]  Physical Therapy Treatment  Patient Details  Name: Kathleen [name] MRN: [id number] Date of Birth: 06-07-1965 Referring Provider: Elenore Paddy, FNP   Encounter Date: 11/17/2017  PT End of Session - 11/17/17 1010    Visit Number  3    Number of Visits  13    Date for PT Re-Evaluation  10/09/17    Authorization - Visit Number  5    Authorization - Number of Visits  10    PT Start Time  0803    PT Stop Time  0900    PT Time Calculation (min)  57 min    Activity Tolerance  Patient tolerated treatment well    Behavior During Therapy  [facility] for tasks assessed/performed       Past Medical History:  Diagnosis Date  . Anxiety   . Asthma   . Chronic back pain   . 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 COPD (chronic obstructive pulmonary disease) ([place])   . Diabetes mellitus 01/03/2013   Insulin Dependent  . Headache(784.0)   . Hypertension   . Neuropathy   . Post traumatic stress disorder   . Schizoaffective disorder, bipolar type ([place])   . Seizures ([place]) 01/03/2013   Most recent 12/31/2012, per pt report.    Past Surgical History:  Procedure Laterality Date  . ABDOMINAL HYSTERECTOMY      There were no vitals filed for this visit.  Subjective Assessment - 11/17/17 0807    Subjective  I am now in the apartment by myself. I sent my daughter home.  i had a little help from my neighbor.  i have been doing the exercises. I am sleeping in bed now 50% of time with pillows.    Pain Score  8     Pain Location  Back    Pain Orientation  Lower    Pain Descriptors / Indicators  Sharp;Constant    Pain Type  Chronic pain    Pain Frequency  Constant    Aggravating Factors   moving,  getting dresses,      Pain Relieving Factors  pillows,  medication,  heating pad    Effect of Pain on Daily Activities  extra time,  needs some  help , sleeping difficult                       [facility] Adult PT Treatment/Exercise - 11/17/17 0001      Lumbar Exercises: Stretches   Passive Hamstring Stretch  3 reps;30 seconds each sitting    Hip Flexor Stretch  Left;Right;3 reps;30 seconds standing modified, this feels good    Other Lumbar Stretch Exercise  sidelying  passive  PNF  posterior rotation emphasis.   decreases pain      Lumbar Exercises: Standing   Other Standing Lumbar Exercises  facing wall  sliding towel overhead 10 X      Lumbar Exercises: Sidelying   Clam  10 reps    Clam Limitations  pilates Level 1 added to HEP      Lumbar Exercises: Prone   Other Prone Lumbar Exercises  multifitus press X 10 and press with press with  knee flesion 10 x    Other Prone Lumbar Exercises  10      Knee/Hip Exercises: Stretches   Piriformis Stretch  1 rep;30 seconds;Both sitting left stiffer,  decreased ROM cued for technique     Other Knee/Hip Stretches  prone hip IR/stretch 3 x 30 helped pain bilaterally  HEP      Modalities   Modalities  Moist Heat      Moist Heat Therapy   Number Minutes Moist Heat  10 Minutes    Moist Heat Location  Lumbar Spine             PT Education - 11/17/17 1009    Education provided  Yes    Education Details  HEP    Person(s) Educated  Patient    Methods  Explanation;Tactile cues;Verbal cues;Handout;Demonstration    Comprehension  Verbalized understanding;Returned demonstration       PT Short Term Goals - 11/03/17 1251      PT SHORT TERM GOAL #1   Title  Pt will be I with HEP    Baseline  not performing exercises    Time  3    Period  Weeks    Status  New    Target Date  11/24/17      PT SHORT TERM GOAL #2   Title  Pt will demonstrate/verbalize correct posture to decrease pain to </= 3/10 to improve quality of life    Baseline  forward flexed posture, forward head, rounded shoulders    Time  3    Period  Weeks    Status  New    Target Date  11/24/17         PT Long Term Goals - 11/03/17 1256      PT LONG TERM GOAL #1   Title  Pt will increase lumbar flexion AROM to >/= 50 degree to improve ability to perform ADLs    Baseline  12 degrees    Time  6    Period  Weeks    Status  New    Target Date  12/15/17      PT LONG TERM GOAL #2   Title  Pt will increase lumbar AROM R and L sidebending and lumbar extension to >/= 20 degrees to improve ability to perform ADLs     Baseline  extension 6, R SB 8, L SB 4 degrees    Time  6    Period  Weeks    Status  New    Target Date  12/15/17      PT LONG TERM GOAL #3   Title  Pt will be able to stand & walk >/= 30 minutes with pain level </= 3/10 to improve functional mobility and community ambulation    Baseline  pt unable to stand and walk greater than 5 min    Time  6    Period  Weeks    Status  New    Target Date  12/15/17      PT LONG TERM GOAL #4   Title  Pt will be I with HEP by final visit to ensure progress after completion of physical therapy    Baseline  pt not performing exercises    Time  6    Period  Weeks    Status  New    Target Date  12/15/17            Plan - 11/17/17 1011    Clinical Impression Statement  Patient has changed for the positive.  She is now focused on doing what ever it takes to get better.  She worked hard today doing everything asked.  Pain decreased to 7/10 with  exercise.    PT Next Visit Plan  HEP review/update, assess hip strength, core strengthening, hip strengthening, modalities prn, MET    PT Home Exercise Plan  hamstring stretch with strap ( or sitting), lower trunk rotations, pelvic tilts, SLR ( not tolerated)  prone hip extension left,  Quadratus lumborum stretch,   Prone  bilateral hip IR stretch.  multifitus press, with knee flexion with hip extension .    Consulted and Agree with Plan of Care  Patient       Patient will benefit from skilled therapeutic intervention in order to improve the following deficits and impairments:      Visit Diagnosis: Chronic left-sided low back pain without sciatica  Abnormal posture  Decreased range of motion of lumbar spine     Problem List Patient Active Problem List   Diagnosis Date Noted  . Cocaine use disorder, moderate, dependence ([place]) 09/11/2014  . Schizoaffective disorder, bipolar type ([place]) 09/10/2014  . HBP (high blood pressure) 03/29/2013  . Insulin dependent type 2 diabetes mellitus, uncontrolled ([place]) 01/06/2013  . PTSD (post-traumatic stress disorder) 06/24/2011    Class: Chronic    HARRIS,[place]ani PTA 11/17/2017, 10:13 AM  [facility] [address] [place], [place], [address] Phone: [phone]   Fax:  [phone]  Name: Kathleen [name] MRN: [id number] Date of Birth: [birth year]

## 2017-11-24 ENCOUNTER — Ambulatory Visit: Payer: Medicaid Other | Admitting: Physical Therapy

## 2017-11-24 ENCOUNTER — Encounter: Payer: Self-pay | Admitting: Physical Therapy

## 2017-11-24 DIAGNOSIS — M545 Low back pain, unspecified: Secondary | ICD-10-CM

## 2017-11-24 DIAGNOSIS — R293 Abnormal posture: Secondary | ICD-10-CM

## 2017-11-24 DIAGNOSIS — G8929 Other chronic pain: Secondary | ICD-10-CM

## 2017-11-24 DIAGNOSIS — M5386 Other specified dorsopathies, lumbar region: Secondary | ICD-10-CM

## 2017-11-24 NOTE — Therapy (Addendum)
[place], [place], [address] Phone: [phone]   Fax:  [phone]  Physical Therapy Treatment/Re-authorization  Patient Details  Name: Kathleen [name] MRN: [id number] Date of Birth: 02-16-1966 Referring Provider: Elenore Paddy, FNP   Encounter Date: 11/24/2017  PT End of Session - 11/24/17 1253    Visit Number  4    Number of Visits  13    Date for PT Re-Evaluation  12/15/17    PT Start Time  0800    PT Stop Time  0845    PT Time Calculation (min)  45 min    Activity Tolerance  Patient tolerated treatment well    Behavior During Therapy  [facility] for tasks assessed/performed       Past Medical History:  Diagnosis Date  . Anxiety   . Asthma   . Chronic back pain   . 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 COPD (chronic obstructive pulmonary disease) ([place])   . Diabetes mellitus 01/03/2013   Insulin Dependent  . Headache(784.0)   . Hypertension   . Neuropathy   . Post traumatic stress disorder   . Schizoaffective disorder, bipolar type ([place])   . Seizures ([place]) 01/03/2013   Most recent 12/31/2012, per pt report.    Past Surgical History:  Procedure Laterality Date  . ABDOMINAL HYSTERECTOMY      There were no vitals filed for this visit.  Subjective Assessment - 11/24/17 0801    Subjective  "I feel pretty good. i've been working a lot at home."    Currently in Pain?  Yes    Pain Score  6     Pain Location  Back    Pain Orientation  Lower    Pain Descriptors / Indicators  Aching    Pain Type  Chronic pain    Pain Onset  More than a month ago    Pain Frequency  Constant    Aggravating Factors   Sitting for long periods of time    Pain Relieving Factors  heat         [facility] PT Assessment - 11/24/17 0001      AROM   Lumbar Flexion  42 mild pain    Lumbar Extension  20 mild pain    Lumbar - Right Side Bend  18 mild pain    Lumbar - Left Side Bend  20 mild pain                    [facility] Adult PT Treatment/Exercise - 11/24/17 0001      Lumbar Exercises: Stretches   Passive Hamstring Stretch  Left;2 reps;30 seconds    Piriformis Stretch  Right;Left;30 seconds      Lumbar Exercises: Aerobic   Nustep  L4 x 5 min UE and LE      Lumbar Exercises: Standing   Other Standing Lumbar Exercises  sit to stand x 6 reps; cues for forward lean, pushing through knees, and standing up straight      Lumbar Exercises: Seated   Other Seated Lumbar Exercises  physioball roll outs forwards and lateral 10x ea      Lumbar Exercises: Supine   Pelvic Tilt  10 reps;5 seconds x2. verbal cues for correct technique. pt reports dec pain               PT Short Term Goals - 11/24/17 0825      PT SHORT TERM GOAL #1  Title  Pt will be I with HEP    Baseline  pt performing exercises at home    Status  Achieved      PT SHORT TERM GOAL #2   Title  Pt will demonstrate/verbalize correct posture to decrease pain to </= 3/10 to improve quality of life    Baseline  pt still sits with rounded shoulders    Time  3    Period  Weeks    Status  On-going    Target Date  11/24/17        PT Long Term Goals - 11/24/17 0827      PT LONG TERM GOAL #1   Title  Pt will increase lumbar flexion AROM to >/= 50 degree to improve ability to perform ADLs    Baseline  42 degrees flexion    Time  6    Period  Weeks    Status  On-going    Target Date  12/15/17      PT LONG TERM GOAL #2   Title  Pt will increase lumbar AROM R and L sidebending and lumbar extension to >/= 20 degrees to improve ability to perform ADLs     Baseline  ext 20 deg; R SB 18 deg; L SB 20 deg    Time  6    Period  Weeks    Status  On-going    Target Date  12/15/17      PT LONG TERM GOAL #3   Title  Pt will be able to stand & walk >/= 30 minutes with pain level </= 3/10 to improve functional mobility and community ambulation    Baseline  pt unable to stand and walk greater than 5 min     Time  6    Period  Weeks    Status  On-going    Target Date  12/15/17      PT LONG TERM GOAL #4   Title  Pt will be I with HEP by final visit to ensure progress after completion of physical therapy    Baseline  pt performing exercises at home    Time  6    Period  Weeks    Status  On-going    Target Date  12/15/17            Plan - 11/24/17 0950    Clinical Impression Statement  Reassessment performed today. Pt demonstrates increase in lumbar AROM in all planes. Treatment focused on stretching, MET for L hip flexor to address L innominate rotation, core strengthening, and sit to stand working on forward lean "nose over toes." Pt reports decrease in pain at end of session (6/10 to 3/10 at end of session). Pt achieved HEP short term goal and is still working on her posture. She is showing progress towards all of her LTGs and is very motivated to continue and get better with physical therapy. She would benefit from continued therapy services to address pain, ROM, strength, posture, and functional mobility.     Rehab Potential  Good    PT Frequency  2x / week    PT Duration  4 weeks    PT Treatment/Interventions  ADLs/Self Care Home Management;Therapeutic exercise;Therapeutic activities;Electrical Stimulation;Iontophoresis 50m/ml Dexamethasone;Neuromuscular re-education;Patient/family education;Manual techniques;Splinting;Taping;Passive range of motion;Traction;Ultrasound;Moist Heat;Gait training;Functional mobility training;Cryotherapy;Stair training;Dry needling    PT Next Visit Plan  HEP review/update, assess hip strength, core strengthening, hip strengthening, modalities prn, MET, postural training, sit to stand, lumbar flexion activities    PT  Home Exercise Plan  hamstring stretch with strap ( or sitting), lower trunk rotations, pelvic tilts, SLR ( not tolerated)  prone hip extension left,  Quadratus lumborum stretch,   Prone  bilateral hip IR stretch.  multifitus press, with knee flexion  with hip extension .    Consulted and Agree with Plan of Care  Patient       Patient will benefit from skilled therapeutic intervention in order to improve the following deficits and impairments:  Pain, Decreased strength, Difficulty walking, Increased muscle spasms, Decreased range of motion, Impaired UE functional use, Decreased activity tolerance, Decreased mobility, Postural dysfunction, Decreased endurance, Abnormal gait  Visit Diagnosis: Chronic left-sided low back pain without sciatica  Abnormal posture  Decreased range of motion of lumbar spine  Acute bilateral low back pain without sciatica     Problem List Patient Active Problem List   Diagnosis Date Noted  . Cocaine use disorder, moderate, dependence ([place]) 09/11/2014  . Schizoaffective disorder, bipolar type ([place]) 09/10/2014  . HBP (high blood pressure) 03/29/2013  . Insulin dependent type 2 diabetes mellitus, uncontrolled ([place]) 01/06/2013  . PTSD (post-traumatic stress disorder) 06/24/2011    Class: [place], SPT 11/24/17 1:47 PM   [facility] [address] [place], [place], [address] Phone: [phone]   Fax:  [phone]  Name: LEYLANI DULEY MRN: [id number] Date of Birth: 07/01/1965

## 2017-12-10 ENCOUNTER — Ambulatory Visit: Payer: Medicaid Other | Attending: Family Medicine | Admitting: Physical Therapy

## 2017-12-10 ENCOUNTER — Encounter: Payer: Self-pay | Admitting: Physical Therapy

## 2017-12-10 DIAGNOSIS — R293 Abnormal posture: Secondary | ICD-10-CM | POA: Diagnosis present

## 2017-12-10 DIAGNOSIS — M5386 Other specified dorsopathies, lumbar region: Secondary | ICD-10-CM | POA: Diagnosis present

## 2017-12-10 DIAGNOSIS — M545 Low back pain, unspecified: Secondary | ICD-10-CM

## 2017-12-10 DIAGNOSIS — G8929 Other chronic pain: Secondary | ICD-10-CM | POA: Insufficient documentation

## 2017-12-10 NOTE — Therapy (Signed)
[facility] [address] [place], [place], [address] Phone: [phone]   Fax:  [phone]  Physical Therapy Treatment  Patient Details  Name: Kathleen [name] MRN: [id number] Date of Birth: 1966/04/10 Referring Provider: Virl Axe, FNP   Encounter Date: 12/10/2017  PT End of Session - 12/10/17 1154    Visit Number  5    Number of Visits  13    Date for PT Re-Evaluation  12/15/17    Authorization Type  Medicaid     Authorization Time Period  09/07/2017-10/11/2017, 12/08/2017 through 01/04/2018    Authorization - Visit Number  1    Authorization - Number of Visits  8    PT Start Time  1103    PT Stop Time  1200    PT Time Calculation (min)  57 min    Activity Tolerance  Patient tolerated treatment well    Behavior During Therapy  [facility] for tasks assessed/performed       Past Medical History:  Diagnosis Date  . Anxiety   . Asthma   . Chronic back pain   . Cocaine-induced mental disorder (HCC)    psychosis  . COPD (chronic obstructive pulmonary disease) (HCC)   . Diabetes mellitus 01/03/2013   Insulin Dependent  . Headache(784.0)   . Hypertension   . Neuropathy   . Post traumatic stress disorder   . Schizoaffective disorder, bipolar type (HCC)   . Seizures (HCC) 01/03/2013   Most recent 12/31/2012, per pt report.    Past Surgical History:  Procedure Laterality Date  . ABDOMINAL HYSTERECTOMY      There were no vitals filed for this visit.  Subjective Assessment - 12/10/17 1108    Subjective  No appetite .  It started last week.  I see the MD tomorrow about it.  Back pain is 8/10.  I have been doing the exercises and they work.  Wants a copy of her re-eval.     Currently in Pain?  Yes    Pain Score  8     Pain Location  Back    Pain Orientation  Lower;Mid    Pain Descriptors / Indicators  Sharp    Pain Type  Chronic pain    Pain Frequency  Constant    Aggravating Factors   walking up hill,  sitting too long    Pain Relieving Factors  exercises ,  sitting too long    Effect of Pain on Daily Activities  wakes,  limits walking and ADL.                       [facility] Adult PT Treatment/Exercise - 12/10/17 0001      Self-Care   Other Self-Care Comments   scoot to side for sit to stand to avoid spasm       Lumbar Exercises: Stretches   Other Lumbar Stretch Exercise  sitting thoracic stretch one way,  shoulders to the right,  Sitting side bend QL stretch 3 reps.  Feels good      Moist Heat Therapy   Number Minutes Moist Heat  10 Minutes    Moist Heat Location  Lumbar Spine      Electrical Stimulation   Electrical Stimulation Location  lumbar gluteal    Electrical Stimulation Action  IFC    Electrical Stimulation Parameters  as tolerated    Electrical Stimulation Goals  Pain      Manual Therapy   Soft tissue mobilization  low back mid back and gluteals sidelying over pillow to stretch Quadratus lumborum.  Passive stretch to QL,  tissue softened and pain decreased to 3/10             PT Education - 12/10/17 1153    Education provided  Yes    Education Details  HEP ( previous) form    Person(s) Educated  Patient    Methods  Explanation    Comprehension  Verbalized understanding;Returned demonstration                 Plan - 12/10/17 1200    Clinical Impression Statement  Pain flare today.  Able to decrease pain from 8/10 to 2/10.  Focus on pain/spasm relief.  She is going to get an aid to assist with home tasks.    Hips were level today.    PT Next Visit Plan  assess today's treatment.  Work toward goals    PT Home Exercise Plan  hamstring stretch with strap ( or sitting), lower trunk rotations, pelvic tilts, SLR ( not tolerated)  prone hip extension left,  Quadratus lumborum stretch,   Prone  bilateral hip IR stretch.  multifitus press, with knee flexion with hip extension .    Consulted and Agree with Plan of Care  Patient       Patient will benefit from  skilled therapeutic intervention in order to improve the following deficits and impairments:     Visit Diagnosis: Chronic left-sided low back pain without sciatica  Abnormal posture  Decreased range of motion of lumbar spine  Acute bilateral low back pain without sciatica     Problem List Patient Active Problem List   Diagnosis Date Noted  . Cocaine use disorder, moderate, dependence (HCC) 09/11/2014  . Schizoaffective disorder, bipolar type (HCC) 09/10/2014  . HBP (high blood pressure) 03/29/2013  . Insulin dependent type 2 diabetes mellitus, uncontrolled (HCC) 01/06/2013  . PTSD (post-traumatic stress disorder) 06/24/2011    Class: Chronic    Chistopher Mangino,Lillybeth PTA 12/10/2017, 12:18 PM  [facility] [address] [place], [place], [address] Phone: [phone]   Fax:  [phone]  Name: Kathleen HarderKaren C Sawyers MRN: [id number] Date of Birth: 1965/11/24

## 2017-12-10 NOTE — Patient Instructions (Signed)
Continue HEP  Pain free

## 2017-12-11 ENCOUNTER — Ambulatory Visit: Payer: Medicaid Other | Admitting: Physical Therapy

## 2017-12-11 ENCOUNTER — Encounter: Payer: Self-pay | Admitting: Physical Therapy

## 2017-12-11 DIAGNOSIS — M545 Low back pain, unspecified: Secondary | ICD-10-CM

## 2017-12-11 DIAGNOSIS — R293 Abnormal posture: Secondary | ICD-10-CM

## 2017-12-11 DIAGNOSIS — G8929 Other chronic pain: Secondary | ICD-10-CM

## 2017-12-11 NOTE — Therapy (Signed)
[facility] [address] [place], [place], [address] Phone: [phone]   Fax:  [phone]  Physical Therapy Treatment  Patient Details  Name: Kathleen [name] MRN: [id number] Date of Birth: [birth year] Referring Provider: Virl AxeKartaoui, [place], FNP   Encounter Date: 12/11/2017  PT End of Session - 12/11/17 1200    Visit Number  6    Number of Visits  13    Date for PT Re-Evaluation  12/15/17    Authorization Type  Medicaid     Authorization Time Period  09/07/2017-10/11/2017, 12/08/2017 through 01/04/2018    Authorization - Visit Number  2    Authorization - Number of Visits  8    PT Start Time  1102    PT Stop Time  1141    PT Time Calculation (min)  39 min    Activity Tolerance  Patient tolerated treatment well    Behavior During Therapy  [facility] for tasks assessed/performed       Past Medical History:  Diagnosis Date  . Anxiety   . Asthma   . Chronic back pain   . Cocaine-induced mental disorder (HCC)    psychosis  . COPD (chronic obstructive pulmonary disease) (HCC)   . Diabetes mellitus 01/03/2013   Insulin Dependent  . Headache(784.0)   . Hypertension   . Neuropathy   . Post traumatic stress disorder   . Schizoaffective disorder, bipolar type (HCC)   . Seizures (HCC) 01/03/2013   Most recent 12/31/2012, per pt report.    Past Surgical History:  Procedure Laterality Date  . ABDOMINAL HYSTERECTOMY      There were no vitals filed for this visit.  Subjective Assessment - 12/11/17 1111    Subjective  "I'm feeling good. The e-stim yesterday really helped."    Currently in Pain?  Yes    Pain Score  5     Pain Location  Back    Pain Orientation  Lower;Mid    Pain Descriptors / Indicators  Sore    Pain Type  Chronic pain    Pain Onset  More than a month ago    Pain Frequency  Constant    Aggravating Factors   standing or sitting too long    Pain Relieving Factors  exercises and stretches                        [facility] Adult PT Treatment/Exercise - 12/11/17 0001      Lumbar Exercises: Stretches   Passive Hamstring Stretch  Right;Left;2 reps;30 seconds    Hip Flexor Stretch  Right;Left;2 reps;30 seconds   standing   Piriformis Stretch  1 rep;30 seconds;Right;Left      Lumbar Exercises: Aerobic   Nustep  L5 x 5 min LE only      Lumbar Exercises: Machines for Strengthening   Leg Press  2 plates 2 x 15; 1 plate 1 x 15   cues for foot placement and controlled descent     Lumbar Exercises: Seated   Other Seated Lumbar Exercises  physioball roll outs forwards and lateral 10x ea      Lumbar Exercises: Supine   Bent Knee Raise  10 reps   with pelvic tilt     Lumbar Exercises: Sidelying   Clam  Left;Right;15 reps   x 2 sets. cues to keep hips rolled forward     Knee/Hip Exercises: Supine   Bridges  2 sets;10 reps;Both  PT Education - 12/11/17 1159    Education provided  Yes    Education Details  HEP update to include standing hip flexor stretch, importanace of exercise in addition to e-stim    Person(s) Educated  Patient    Methods  Explanation;Handout    Comprehension  Verbalized understanding       PT Short Term Goals - 11/24/17 0825      PT SHORT TERM GOAL #1   Title  Pt will be I with HEP    Baseline  pt performing exercises at home    Status  Achieved      PT SHORT TERM GOAL #2   Title  Pt will demonstrate/verbalize correct posture to decrease pain to </= 3/10 to improve quality of life    Baseline  pt still sits with rounded shoulders    Time  3    Period  Weeks    Status  On-going    Target Date  11/24/17        PT Long Term Goals - 11/24/17 0827      PT LONG TERM GOAL #1   Title  Pt will increase lumbar flexion AROM to >/= 50 degree to improve ability to perform ADLs    Baseline  42 degrees flexion    Time  6    Period  Weeks    Status  On-going    Target Date  12/15/17      PT LONG TERM GOAL #2   Title   Pt will increase lumbar AROM R and L sidebending and lumbar extension to >/= 20 degrees to improve ability to perform ADLs     Baseline  ext 20 deg; R SB 18 deg; L SB 20 deg    Time  6    Period  Weeks    Status  On-going    Target Date  12/15/17      PT LONG TERM GOAL #3   Title  Pt will be able to stand & walk >/= 30 minutes with pain level </= 3/10 to improve functional mobility and community ambulation    Baseline  pt unable to stand and walk greater than 5 min    Time  6    Period  Weeks    Status  On-going    Target Date  12/15/17      PT LONG TERM GOAL #4   Title  Pt will be I with HEP by final visit to ensure progress after completion of physical therapy    Baseline  pt performing exercises at home    Time  6    Period  Weeks    Status  On-going    Target Date  12/15/17            Plan - 12/11/17 1201    Clinical Impression Statement  Pt arrives with minimal pain today; reports e-stim helped to calm the pain tremendously yesterday. Treatment focused on core and hip strenghthening which pt tolerated well. Incorporated leg press today; pt states she likes this exercise and that it feels good. Pt reports decreased pain at end of session (2/10)    PT Treatment/Interventions  ADLs/Self Care Home Management;Therapeutic exercise;Therapeutic activities;Electrical Stimulation;Iontophoresis 4mg /ml Dexamethasone;Neuromuscular re-education;Patient/family education;Manual techniques;Splinting;Taping;Passive range of motion;Traction;Ultrasound;Moist Heat;Gait training;Functional mobility training;Cryotherapy;Stair training;Dry needling    PT Next Visit Plan  reassess, core strengthening, hip strengthening, modalities and manual prn    PT Home Exercise Plan  hamstring stretch with strap ( or sitting), lower trunk  rotations, pelvic tilts, SLR ( not tolerated)  prone hip extension left,  Quadratus lumborum stretch,   Prone  bilateral hip IR stretch.  multifitus press, with knee flexion with  hip extension , standing hip flexor stretch    Consulted and Agree with Plan of Care  Patient       Patient will benefit from skilled therapeutic intervention in order to improve the following deficits and impairments:  Pain, Decreased strength, Difficulty walking, Increased muscle spasms, Decreased range of motion, Impaired UE functional use, Decreased activity tolerance, Decreased mobility, Postural dysfunction, Decreased endurance, Abnormal gait  Visit Diagnosis: Chronic left-sided low back pain without sciatica  Abnormal posture  Acute bilateral low back pain without sciatica     Problem List Patient Active Problem List   Diagnosis Date Noted  . Cocaine use disorder, moderate, dependence (HCC) 09/11/2014  . Schizoaffective disorder, bipolar type (HCC) 09/10/2014  . HBP (high blood pressure) 03/29/2013  . Insulin dependent type 2 diabetes mellitus, uncontrolled (HCC) 01/06/2013  . PTSD (post-traumatic stress disorder) 06/24/2011    Class: Chronic   Laurelyn Sickle, SPT 12/11/17 12:08 PM   [facility] [address] [place], [place], [address] Phone: [phone]   Fax:  [phone]  Name: Kathleen [name] MRN: [id number] Date of Birth: 02/06/1966

## 2017-12-15 ENCOUNTER — Ambulatory Visit: Payer: Medicaid Other | Admitting: Physical Therapy

## 2017-12-15 ENCOUNTER — Encounter: Payer: Self-pay | Admitting: Physical Therapy

## 2017-12-15 DIAGNOSIS — M545 Low back pain, unspecified: Secondary | ICD-10-CM

## 2017-12-15 DIAGNOSIS — R293 Abnormal posture: Secondary | ICD-10-CM

## 2017-12-15 DIAGNOSIS — M5386 Other specified dorsopathies, lumbar region: Secondary | ICD-10-CM

## 2017-12-15 DIAGNOSIS — G8929 Other chronic pain: Secondary | ICD-10-CM

## 2017-12-15 NOTE — Therapy (Addendum)
[facility] [address] [place], [place], [address] Phone: [phone]   Fax:  [phone]  Physical Therapy Treatment/Re-certification  Patient Details  Name: Kathleen HarderKaren C Kucinski MRN: [id number] Date of Birth: 03-03-1966 Referring Provider: Virl AxeKartaoui, [place], FNP   Encounter Date: 12/15/2017 PT End of Session - 12/15/17 0853    Visit Number  7    Number of Visits  13    Date for PT Re-Evaluation  12/31/17    Authorization Type  Medicaid     Authorization Time Period  09/07/2017-10/11/2017, 12/08/2017 through 01/04/2018    Authorization - Visit Number  3    Authorization - Number of Visits  8    PT Start Time  [phone]    PT Stop Time  0930    PT Time Calculation (min)  38 min    Activity Tolerance  Patient tolerated treatment well    Behavior During Therapy  [facility] assessed/[facility]       Past Medical History:  Diagnosis Date  . Anxiety   . Asthma   . Chronic back pain   . Cocaine-induced mental disorder (HCC)    psychosis  . COPD (chronic obstructive pulmonary disease) (HCC)   . Diabetes mellitus 01/03/2013   Insulin Dependent  . Headache(784.0)   . Hypertension   . Neuropathy   . Post traumatic stress disorder   . Schizoaffective disorder, bipolar type (HCC)   . Seizures (HCC) 01/03/2013   Most recent 12/31/2012, per pt report.    Past Surgical History:  Procedure Laterality Date  . ABDOMINAL HYSTERECTOMY      There were no vitals filed for this visit.  Subjective Assessment - 12/15/17 0900    Subjective  "My back is okay today. I am having a lot of spasm though."    Currently in Pain?  Yes    Pain Score  5     Pain Location  Back    Pain Orientation  Lower    Pain Descriptors / Indicators  --   tight   Pain Type  Chronic pain    Pain Onset  More than a month ago    Pain Frequency  Constant         [facility] PT Assessment - 12/15/17 0001      AROM   Lumbar Flexion  42    Lumbar Extension  20    Lumbar  - Right Side Bend  18   more painful on the R   Lumbar - Left Side Bend  22      Strength   Right Hip Flexion  4-/5    Right Hip ABduction  4+/5    Right Hip ADduction  4/5   tested in supine   Left Hip Flexion  4+/5    Left Hip ABduction  4/5    Left Hip ADduction  4/5   tested in supine   Right Knee Flexion  5/5    Left Knee Flexion  5/5                   [facility] Adult PT Treatment/Exercise - 12/15/17 0001      Self-Care   Other Self-Care Comments   teaching of how to use theracane      Lumbar Exercises: Aerobic   Nustep  L4 x 5 min LE only      Manual Therapy   Soft tissue mobilization  MTPr R lumbar paraspinals   with physioball rollouts  PT Education - 12/15/17 1205    Education provided  Yes    Education Details  theracane and how to use, POC and moving to 1x/wk per pt request, importance of performing HEP    Person(s) Educated  Patient    Methods  Explanation    Comprehension  Verbalized understanding       PT Short Term Goals - 12/15/17 1211      PT SHORT TERM GOAL #1   Title  Pt will be I with HEP    Status  Achieved      PT SHORT TERM GOAL #2   Title  Pt will demonstrate/verbalize correct posture to decrease pain to </= 3/10 to improve quality of life    Baseline  still experiencing >3/10 pain    Status  On-going        PT Long Term Goals - 12/15/17 1210      PT LONG TERM GOAL #1   Title  Pt will increase lumbar flexion AROM to >/= 50 degree to improve ability to perform ADLs    Baseline  42 degrees flexion    Status  On-going      PT LONG TERM GOAL #2   Title  Pt will increase lumbar AROM R and L sidebending and lumbar extension to >/= 20 degrees to improve ability to perform ADLs     Baseline  ext 20 deg; R SB 18 deg; L SB 22 deg    Status  On-going      PT LONG TERM GOAL #3   Title  Pt will be able to stand & walk >/= 30 minutes with pain level </= 3/10 to improve functional mobility and community ambulation     Baseline  Pt still having > 3/10 pain    Status  On-going      PT LONG TERM GOAL #4   Title  Pt will be I with HEP by final visit to ensure progress after completion of physical therapy    Baseline  pt performing exercises at home    Status  On-going            Plan - 12/15/17 1206    Clinical Impression Statement  Pt arrives with moderate pain today. Reassessment performed today; pt showing similar lumbar ROM to last assessment [facility]) and increased hip strength as compared to initial evaluation. Treatment focused on MTPr of R lumbar paraspinals as pt reports feeling increased spasm recently. Also spent time educating pt on how to use theracane so she can address increased muscle spasm when she is at home. Pt requests to decrease to 1x/wk and therapist agrees. Pt is performing exercises at home independently. Will work towards more independent exercise at home and finalizing HEP in final sessions to prepare for discharge.     Rehab Potential  Good    PT Frequency  1x / week    PT Duration  2 weeks    PT Treatment/Interventions  ADLs/Self Care Home Management;Therapeutic exercise;Therapeutic activities;Electrical Stimulation;Iontophoresis 4mg /ml Dexamethasone;Neuromuscular re-education;Patient/family education;Manual techniques;Splinting;Taping;Passive range of motion;Traction;Ultrasound;Moist Heat;Gait training;Functional mobility training;Cryotherapy;Stair training;Dry needling    PT Next Visit Plan  core strengthening, hip strengthening, modalities and manual prn    PT Home Exercise Plan  hamstring stretch with strap ( or sitting), lower trunk rotations, pelvic tilts, SLR ( not tolerated)  prone hip extension left,  Quadratus lumborum stretch,   Prone  bilateral hip IR stretch.  multifitus press, with knee flexion with hip extension , standing hip flexor  stretch    Consulted and Agree with Plan of Care  Patient       Patient will benefit from skilled therapeutic intervention in order to  improve the following deficits and impairments:  Pain, Decreased strength, Difficulty walking, Increased muscle spasms, Decreased range of motion, Impaired UE functional use, Decreased activity tolerance, Decreased mobility, Postural dysfunction, Decreased endurance, Abnormal gait  Visit Diagnosis: Chronic left-sided low back pain without sciatica  Abnormal posture  Acute bilateral low back pain without sciatica  Decreased range of motion of lumbar spine     Problem List Patient Active Problem List   Diagnosis Date Noted  . Cocaine use disorder, moderate, dependence (HCC) 09/11/2014  . Schizoaffective disorder, bipolar type (HCC) 09/10/2014  . HBP (high blood pressure) 03/29/2013  . Insulin dependent type 2 diabetes mellitus, uncontrolled (HCC) 01/06/2013  . PTSD (post-traumatic stress disorder) 06/24/2011    Class: Chronic   Laurelyn Sickle, SPT 12/15/17 12:27 PM   [facility] [address] [place], [place], [address] Phone: [phone]   Fax:  [phone]  Name: JOLIANA CLAFLIN MRN: [id number] Date of Birth: 11-Jun-1965

## 2017-12-17 ENCOUNTER — Ambulatory Visit: Payer: Medicaid Other | Admitting: Physical Therapy

## 2017-12-17 DIAGNOSIS — M545 Low back pain, unspecified: Secondary | ICD-10-CM

## 2017-12-17 DIAGNOSIS — R293 Abnormal posture: Secondary | ICD-10-CM

## 2017-12-17 DIAGNOSIS — G8929 Other chronic pain: Secondary | ICD-10-CM

## 2017-12-17 DIAGNOSIS — M5386 Other specified dorsopathies, lumbar region: Secondary | ICD-10-CM

## 2017-12-17 NOTE — Patient Instructions (Addendum)

## 2017-12-17 NOTE — Therapy (Deleted)
[place], [place], [address] Phone: [phone]   Fax:  [phone]  Physical Therapy Treatment  Patient Details  Name: Kathleen [name] MRN: [id number] Date of Birth: 1966-01-30 Referring Provider: Elenore Paddy, FNP   Encounter Date: 12/17/2017  PT End of Session - 12/17/17 0918    Visit Number  8    Number of Visits  13    Date for PT Re-Evaluation  12/31/17    Authorization Type  Medicaid     Authorization Time Period  09/07/2017-10/11/2017, 12/08/2017 through 01/04/2018    Authorization - Visit Number  4    Authorization - Number of Visits  8    PT Start Time  0850   short session at patient's request   PT Stop Time  0919    PT Time Calculation (min)  29 min    Activity Tolerance  Patient tolerated treatment well    Behavior During Therapy  [facility] for tasks assessed/performed       Past Medical History:  Diagnosis Date  . Anxiety   . Asthma   . Chronic back pain   . 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 COPD (chronic obstructive pulmonary disease) ([place])   . Diabetes mellitus 01/03/2013   Insulin Dependent  . Headache(784.0)   . Hypertension   . Neuropathy   . Post traumatic stress disorder   . Schizoaffective disorder, bipolar type ([place])   . Seizures ([place]) 01/03/2013   Most recent 12/31/2012, per pt report.    Past Surgical History:  Procedure Laterality Date  . ABDOMINAL HYSTERECTOMY      There were no vitals filed for this visit.  Subjective Assessment - 12/17/17 0851    Subjective  I saw MD yesterday.  I told my MD  PT helps a few hours then the muscle spasms hit sharp and they are constant.    Currently in Pain?  Yes    Pain Score  10-Worst pain ever    Pain Location  Back    Pain Orientation  Lower    Pain Descriptors / Indicators  Spasm    Pain Type  Chronic pain    Pain Frequency  Constant    Aggravating Factors   sitting standing walking more than 5 minutes     Pain Relieving Factors  PT,  exercises stretches ,, supine with pillows under knee.    Effect of Pain on Daily Activities  Hard to dress,  ADL difficult,  sleeping difficult                       [facility] Adult PT Treatment/Exercise - 12/17/17 0001      Self-Care   Posture  education,,    Other Self-Care Comments   ADL handout issued and reviewed.              PT Education - 12/17/17 [id number]    Education provided  Yes    Education Details  HEP,  self care    Person(s) Educated  Patient    Methods  Explanation;Verbal cues;Handout    Comprehension  Verbalized understanding       PT Short Term Goals - 12/17/17 0902      PT SHORT TERM GOAL #1   Title  Pt will be I with HEP    Baseline  pt performing exercises at home    Time  3    Period  Weeks  Status  Achieved      PT SHORT TERM GOAL #2   Title  Pt will demonstrate/verbalize correct posture to decrease pain to </= 3/10 to improve quality of life    Baseline  education today able to understand  pain has not been decreased    Time  3    Period  Weeks    Status  Partially Met      PT SHORT TERM GOAL #3   Title  Patient will increase lumbar flexion by 50 %     Baseline  patient declined to be tested due to pain    Time  3    Period  Weeks    Status  Deferred      PT SHORT TERM GOAL #4   Title  Patient will be idependent with basic HEP to improve gross movement     Baseline  independent    Time  3    Period  Weeks    Status  Achieved        PT Long Term Goals - 12/17/17 3383      PT LONG TERM GOAL #1   Title  Pt will increase lumbar flexion AROM to >/= 50 degree to improve ability to perform ADLs    Baseline  42 degrees flexion    Time  6    Period  Weeks    Status  Not Met      PT LONG TERM GOAL #2   Title  Pt will increase lumbar AROM R and L sidebending and lumbar extension to >/= 20 degrees to improve ability to perform ADLs     Baseline  ext 20 deg; R SB 18 deg; L SB 22 deg    Time   6    Period  Weeks    Status  Not Met      PT LONG TERM GOAL #3   Title  Pt will be able to stand & walk >/= 30 minutes with pain level </= 3/10 to improve functional mobility and community ambulation    Baseline  above limited to 5 minutes  and pain level sometimes 10/10.    Time  6    Period  Weeks    Status  Not Met      PT LONG TERM GOAL #4   Title  Pt will be I with HEP by final visit to ensure progress after completion of physical therapy    Baseline  independent after modification today    Time  6    Period  Weeks    Status  Achieved            Plan - 12/17/17 0912    Clinical Impression Statement  Last day for PT , patient's request.  She will do the exercises at home.    Goals checked see flow sheet.  Pain continues at 10 /10 and is briefly relieved with PT with no lasting relief    PT Next Visit Plan  Discharge to home exercise program,  She will consider Silver Sneakers in the future.    PT Home Exercise Plan  hamstring stretch with strap ( or sitting), lower trunk rotations, pelvic tilts, SLR ( not tolerated)  prone hip extension left,  Quadratus lumborum stretch,   Prone  bilateral hip IR stretch.  multifitus press, with knee flexion with hip extension , supine hip flexor stretch    Consulted and Agree with Plan of Care  Patient  Patient will benefit from skilled therapeutic intervention in order to improve the following deficits and impairments:     Visit Diagnosis: Chronic left-sided low back pain without sciatica  Abnormal posture  Acute bilateral low back pain without sciatica  Decreased range of motion of lumbar spine     Problem List Patient Active Problem List   Diagnosis Date Noted  . Cocaine use disorder, moderate, dependence ([place]) 09/11/2014  . Schizoaffective disorder, bipolar type ([place]) 09/10/2014  . HBP (high blood pressure) 03/29/2013  . Insulin dependent type 2 diabetes mellitus, uncontrolled ([place]) 01/06/2013  . PTSD  (post-traumatic stress disorder) 06/24/2011    Class: Chronic    HARRIS,Romesha   PTA 12/17/2017, 9:20 AM  [facility] [address] [place], [place], [address] Phone: [phone]   Fax:  [phone]  Name: Kathleen [name] MRN: [id number] Date of Birth: 1965-09-08

## 2017-12-17 NOTE — Therapy (Addendum)
[place], [place], [address] Phone: [phone]   Fax:  [phone]  Physical Therapy Treatment / Discharge Summary  Patient Details  Name: Kathleen [name] MRN: [id number] Date of Birth: May 23, 1965 Referring Provider: Elenore Paddy, FNP   Encounter Date: 12/17/2017  PT End of Session - 12/17/17 0918    Visit Number  8    Number of Visits  13    Date for PT Re-Evaluation  12/31/17    Authorization Type  Medicaid     Authorization Time Period  09/07/2017-10/11/2017, 12/08/2017 through 01/04/2018    Authorization - Visit Number  4    Authorization - Number of Visits  8    PT Start Time  0850   short session at patient's request   PT Stop Time  0919    PT Time Calculation (min)  29 min    Activity Tolerance  Patient tolerated treatment well    Behavior During Therapy  [facility] for tasks assessed/performed       Past Medical History:  Diagnosis Date  . Anxiety   . Asthma   . Chronic back pain   . 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 COPD (chronic obstructive pulmonary disease) ([place])   . Diabetes mellitus 01/03/2013   Insulin Dependent  . Headache(784.0)   . Hypertension   . Neuropathy   . Post traumatic stress disorder   . Schizoaffective disorder, bipolar type ([place])   . Seizures ([place]) 01/03/2013   Most recent 12/31/2012, per pt report.    Past Surgical History:  Procedure Laterality Date  . ABDOMINAL HYSTERECTOMY      There were no vitals filed for this visit.  Subjective Assessment - 12/17/17 0851    Subjective  I saw MD yesterday.  I told my MD  PT helps a few hours then the muscle spasms hit sharp and they are constant.    Currently in Pain?  Yes    Pain Score  10-Worst pain ever    Pain Location  Back    Pain Orientation  Lower    Pain Descriptors / Indicators  Spasm    Pain Type  Chronic pain    Pain Frequency  Constant    Aggravating Factors   sitting standing walking  more than 5 minutes    Pain Relieving Factors  PT,  exercises stretches ,, supine with pillows under knee.    Effect of Pain on Daily Activities  Hard to dress,  ADL difficult,  sleeping difficult                       [facility] Adult PT Treatment/Exercise - 12/17/17 0001      Self-Care   Posture  education,,    Other Self-Care Comments   ADL handout issued and reviewed.              PT Education - 12/17/17 [phone]    Education provided  Yes    Education Details  HEP,  self care    Person(s) Educated  Patient    Methods  Explanation;Verbal cues;Handout    Comprehension  Verbalized understanding       PT Short Term Goals - 12/17/17 0902      PT SHORT TERM GOAL #1   Title  Pt will be I with HEP    Baseline  pt performing exercises at home    Time  3    Period  Weeks    Status  Achieved      PT SHORT TERM GOAL #2   Title  Pt will demonstrate/verbalize correct posture to decrease pain to </= 3/10 to improve quality of life    Baseline  education today able to understand  pain has not been decreased    Time  3    Period  Weeks    Status  Partially Met      PT SHORT TERM GOAL #3   Title  Patient will increase lumbar flexion by 50 %     Baseline  patient declined to be tested due to pain    Time  3    Period  Weeks    Status  Deferred      PT SHORT TERM GOAL #4   Title  Patient will be idependent with basic HEP to improve gross movement     Baseline  independent    Time  3    Period  Weeks    Status  Achieved        PT Long Term Goals - 12/17/17 5027      PT LONG TERM GOAL #1   Title  Pt will increase lumbar flexion AROM to >/= 50 degree to improve ability to perform ADLs    Baseline  42 degrees flexion    Time  6    Period  Weeks    Status  Not Met      PT LONG TERM GOAL #2   Title  Pt will increase lumbar AROM R and L sidebending and lumbar extension to >/= 20 degrees to improve ability to perform ADLs     Baseline  ext 20 deg; R SB 18 deg;  L SB 22 deg    Time  6    Period  Weeks    Status  Not Met      PT LONG TERM GOAL #3   Title  Pt will be able to stand & walk >/= 30 minutes with pain level </= 3/10 to improve functional mobility and community ambulation    Baseline  above limited to 5 minutes  and pain level sometimes 10/10.    Time  6    Period  Weeks    Status  Not Met      PT LONG TERM GOAL #4   Title  Pt will be I with HEP by final visit to ensure progress after completion of physical therapy    Baseline  independent after modification today    Time  6    Period  Weeks    Status  Achieved            Plan - 12/17/17 0912    Clinical Impression Statement  Last day for PT , patient's request.  She will do the exercises at home.    Goals checked see flow sheet.  Pain continues at 10 /10 and is briefly relieved with PT with no lasting relief    PT Next Visit Plan  Discharge to home exercise program,  She will consider Silver Sneakers in the future.    PT Home Exercise Plan  hamstring stretch with strap ( or sitting), lower trunk rotations, pelvic tilts, SLR ( not tolerated)  prone hip extension left,  Quadratus lumborum stretch,   Prone  bilateral hip IR stretch.  multifitus press, with knee flexion with hip extension , supine hip flexor stretch    Consulted and Agree with Plan of Care  Patient  Patient will benefit from skilled therapeutic intervention in order to improve the following deficits and impairments:     Visit Diagnosis: Chronic left-sided low back pain without sciatica  Abnormal posture  Acute bilateral low back pain without sciatica  Decreased range of motion of lumbar spine     Problem List Patient Active Problem List   Diagnosis Date Noted  . Cocaine use disorder, moderate, dependence ([place]) 09/11/2014  . Schizoaffective disorder, bipolar type ([place]) 09/10/2014  . HBP (high blood pressure) 03/29/2013  . Insulin dependent type 2 diabetes mellitus, uncontrolled ([place]) 01/06/2013   . PTSD (post-traumatic stress disorder) 06/24/2011    Class: Chronic    HARRIS,[place]luw[place]iola PTA 12/17/2017, 9:29 AM  [facility] [address] [place], [place], [address] Phone: [phone]   Fax:  [phone]  Name: CYNDEL GRIFFEY MRN: [id number] Date of Birth: 1965/07/11         PHYSICAL THERAPY DISCHARGE SUMMARY  Visits from Start of Care: 8  Current functional level related to goals / functional outcomes: See goals   Remaining deficits: See above assessment   Education / Equipment: HEP, theraband, posture education  Plan: Patient agrees to discharge.  Patient goals were partially met. Patient is being discharged due to the patient's request.  ?????       Kristoffer Leamon PT, DPT, LAT, ATC  12/17/17  9:57 AM

## 2017-12-17 NOTE — Therapy (Deleted)
[place], [place], [address] Phone: [phone]   Fax:  [phone]  Physical Therapy Treatment  Patient Details  Name: Kathleen [name] MRN: [id number] Date of Birth: 1966-01-30 Referring Provider: Elenore Paddy, FNP   Encounter Date: 12/17/2017  PT End of Session - 12/17/17 0918    Visit Number  8    Number of Visits  13    Date for PT Re-Evaluation  12/31/17    Authorization Type  Medicaid     Authorization Time Period  09/07/2017-10/11/2017, 12/08/2017 through 01/04/2018    Authorization - Visit Number  4    Authorization - Number of Visits  8    PT Start Time  0850   short session at patient's request   PT Stop Time  0919    PT Time Calculation (min)  29 min    Activity Tolerance  Patient tolerated treatment well    Behavior During Therapy  [facility] for tasks assessed/performed       Past Medical History:  Diagnosis Date  . Anxiety   . Asthma   . Chronic back pain   . 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 COPD (chronic obstructive pulmonary disease) ([place])   . Diabetes mellitus 01/03/2013   Insulin Dependent  . Headache(784.0)   . Hypertension   . Neuropathy   . Post traumatic stress disorder   . Schizoaffective disorder, bipolar type ([place])   . Seizures ([place]) 01/03/2013   Most recent 12/31/2012, per pt report.    Past Surgical History:  Procedure Laterality Date  . ABDOMINAL HYSTERECTOMY      There were no vitals filed for this visit.  Subjective Assessment - 12/17/17 0851    Subjective  I saw MD yesterday.  I told my MD  PT helps a few hours then the muscle spasms hit sharp and they are constant.    Currently in Pain?  Yes    Pain Score  10-Worst pain ever    Pain Location  Back    Pain Orientation  Lower    Pain Descriptors / Indicators  Spasm    Pain Type  Chronic pain    Pain Frequency  Constant    Aggravating Factors   sitting standing walking more than 5 minutes     Pain Relieving Factors  PT,  exercises stretches ,, supine with pillows under knee.    Effect of Pain on Daily Activities  Hard to dress,  ADL difficult,  sleeping difficult                       [facility] Adult PT Treatment/Exercise - 12/17/17 0001      Self-Care   Posture  education,,    Other Self-Care Comments   ADL handout issued and reviewed.              PT Education - 12/17/17 [id number]    Education provided  Yes    Education Details  HEP,  self care    Person(s) Educated  Patient    Methods  Explanation;Verbal cues;Handout    Comprehension  Verbalized understanding       PT Short Term Goals - 12/17/17 0902      PT SHORT TERM GOAL #1   Title  Pt will be I with HEP    Baseline  pt performing exercises at home    Time  3    Period  Weeks  Status  Achieved      PT SHORT TERM GOAL #2   Title  Pt will demonstrate/verbalize correct posture to decrease pain to </= 3/10 to improve quality of life    Baseline  education today able to understand  pain has not been decreased    Time  3    Period  Weeks    Status  Partially Met      PT SHORT TERM GOAL #3   Title  Patient will increase lumbar flexion by 50 %     Baseline  patient declined to be tested due to pain    Time  3    Period  Weeks    Status  Deferred      PT SHORT TERM GOAL #4   Title  Patient will be idependent with basic HEP to improve gross movement     Baseline  independent    Time  3    Period  Weeks    Status  Achieved        PT Long Term Goals - 12/17/17 3383      PT LONG TERM GOAL #1   Title  Pt will increase lumbar flexion AROM to >/= 50 degree to improve ability to perform ADLs    Baseline  42 degrees flexion    Time  6    Period  Weeks    Status  Not Met      PT LONG TERM GOAL #2   Title  Pt will increase lumbar AROM R and L sidebending and lumbar extension to >/= 20 degrees to improve ability to perform ADLs     Baseline  ext 20 deg; R SB 18 deg; L SB 22 deg    Time   6    Period  Weeks    Status  Not Met      PT LONG TERM GOAL #3   Title  Pt will be able to stand & walk >/= 30 minutes with pain level </= 3/10 to improve functional mobility and community ambulation    Baseline  above limited to 5 minutes  and pain level sometimes 10/10.    Time  6    Period  Weeks    Status  Not Met      PT LONG TERM GOAL #4   Title  Pt will be I with HEP by final visit to ensure progress after completion of physical therapy    Baseline  independent after modification today    Time  6    Period  Weeks    Status  Achieved            Plan - 12/17/17 0912    Clinical Impression Statement  Last day for PT , patient's request.  She will do the exercises at home.    Goals checked see flow sheet.  Pain continues at 10 /10 and is briefly relieved with PT with no lasting relief    PT Next Visit Plan  Discharge to home exercise program,  She will consider Silver Sneakers in the future.    PT Home Exercise Plan  hamstring stretch with strap ( or sitting), lower trunk rotations, pelvic tilts, SLR ( not tolerated)  prone hip extension left,  Quadratus lumborum stretch,   Prone  bilateral hip IR stretch.  multifitus press, with knee flexion with hip extension , supine hip flexor stretch    Consulted and Agree with Plan of Care  Patient  Patient will benefit from skilled therapeutic intervention in order to improve the following deficits and impairments:     Visit Diagnosis: Chronic left-sided low back pain without sciatica  Abnormal posture  Acute bilateral low back pain without sciatica  Decreased range of motion of lumbar spine     Problem List Patient Active Problem List   Diagnosis Date Noted  . Cocaine use disorder, moderate, dependence ([place]) 09/11/2014  . Schizoaffective disorder, bipolar type ([place]) 09/10/2014  . HBP (high blood pressure) 03/29/2013  . Insulin dependent type 2 diabetes mellitus, uncontrolled ([place]) 01/06/2013  . PTSD  (post-traumatic stress disorder) 06/24/2011    Class: Chronic    HARRIS,[place]  PTA 12/17/2017, 9:25 AM  [facility] [address] [place], [place], [address] Phone: [phone]   Fax:  [phone]  Name: ANNABEL GIBEAU MRN: [id number] Date of Birth: [birth year]

## 2017-12-22 ENCOUNTER — Ambulatory Visit: Payer: Medicaid Other | Admitting: Physical Therapy

## 2017-12-24 ENCOUNTER — Encounter: Payer: Self-pay | Admitting: Physical Therapy

## 2017-12-29 ENCOUNTER — Encounter: Payer: Self-pay | Admitting: Physical Therapy

## 2017-12-31 ENCOUNTER — Encounter: Payer: Self-pay | Admitting: Physical Therapy

## 2018-11-23 ENCOUNTER — Other Ambulatory Visit: Payer: Self-pay | Admitting: Nurse Practitioner

## 2018-11-23 DIAGNOSIS — Z1231 Encounter for screening mammogram for malignant neoplasm of breast: Secondary | ICD-10-CM

## 2019-02-01 ENCOUNTER — Ambulatory Visit: Payer: Medicaid Other | Attending: Nurse Practitioner

## 2019-02-01 ENCOUNTER — Other Ambulatory Visit: Payer: Self-pay

## 2019-02-01 DIAGNOSIS — M542 Cervicalgia: Secondary | ICD-10-CM | POA: Diagnosis not present

## 2019-02-01 DIAGNOSIS — R293 Abnormal posture: Secondary | ICD-10-CM | POA: Diagnosis present

## 2019-02-01 DIAGNOSIS — M25512 Pain in left shoulder: Secondary | ICD-10-CM | POA: Diagnosis present

## 2019-02-01 DIAGNOSIS — M25511 Pain in right shoulder: Secondary | ICD-10-CM | POA: Diagnosis present

## 2019-02-01 NOTE — Patient Instructions (Signed)
Access Code: [email or web address]  URL: [email or web address]  Date: 02/01/2019  Prepared by: Tomma Rakers   Exercises Seated Scapular Retraction - 10 reps - 1 sets - 3 hold - 2x daily - 7x weekly Seated Cervical Retraction - 10 reps - 1 sets - 5 s hold - 2x daily - 7x weekly Seated Cervical Sidebending AROM - 2 reps - 1 sets - 10-20 seconds hold - 2x daily - 7x weekly Gentle Levator Scapulae Stretch - 2 reps - 1 sets - 10-20 seconds hold - 2x daily - 7x weekly
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
## 2019-02-09 ENCOUNTER — Other Ambulatory Visit: Payer: Self-pay

## 2019-02-09 ENCOUNTER — Ambulatory Visit: Payer: Medicaid Other | Attending: Nurse Practitioner | Admitting: Physical Therapy

## 2019-02-09 DIAGNOSIS — M25511 Pain in right shoulder: Secondary | ICD-10-CM | POA: Diagnosis present

## 2019-02-09 DIAGNOSIS — M25512 Pain in left shoulder: Secondary | ICD-10-CM | POA: Diagnosis present

## 2019-02-09 DIAGNOSIS — R293 Abnormal posture: Secondary | ICD-10-CM

## 2019-02-09 DIAGNOSIS — M542 Cervicalgia: Secondary | ICD-10-CM | POA: Insufficient documentation

## 2019-02-09 NOTE — Therapy (Addendum)
[name], [place], [address] Phone: [phone]   Fax:  [phone]  Physical Therapy Treatment/Discharge addendum PHYSICAL THERAPY DISCHARGE SUMMARY  Visits from Start of Care:2  Current functional level related to goals / functional outcomes: See below   Remaining deficits: See below   Education / Equipment: HEP  Plan: Patient agrees to discharge.  Patient goals were not met. Patient is being discharged due to not returning since the last visit.  ?????   [name], PT, DPT 04/27/19 11:35 AM      Patient Details  Name: Kathleen [name] MRN: [id number] Date of Birth: [birth year] Referring Provider (PT): Simona Huh   Encounter Date: 02/09/2019  PT End of Session - 02/09/19 0841    Visit Number  2    Number of Visits  4    Date for PT Re-Evaluation  03/01/19    Authorization Type  MCD    PT Start Time  0745    PT Stop Time  0823    PT Time Calculation (min)  38 min    Activity Tolerance  Patient tolerated treatment well    Behavior During Therapy  [facility] for tasks assessed/performed       Past Medical History:  Diagnosis Date  . Anxiety   . Asthma   . Chronic back pain   . 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 COPD (chronic obstructive pulmonary disease) ([place])   . Diabetes mellitus 01/03/2013   Insulin Dependent  . Headache(784.0)   . Hypertension   . Neuropathy   . Post traumatic stress disorder   . Schizoaffective disorder, bipolar type ([place])   . Seizures ([place]) 01/03/2013   Most recent 12/31/2012, per pt report.    Past Surgical History:  Procedure Laterality Date  . ABDOMINAL HYSTERECTOMY      There were no vitals filed for this visit.  Subjective Assessment - 02/09/19 0837    Subjective  My neck pain is better, I have been doing the HEP at home. only 3/10 pain in my neck today    Pertinent History  PTSD, HTN, COPD    How long can you sit comfortably?  30  min    How long can you stand comfortably?  5-6 min    How long can you walk comfortably?  Pt states she does not walk much.    Patient Stated Goals  "I want to get better"    Pain Onset  1 to 4 weeks ago            [facility] Adult PT Treatment/Exercise - 02/09/19 0001      Exercises   Exercises  Neck      Neck Exercises: Machines for Strengthening   UBE (Upper Arm Bike)  2 min fwd/2 min retro      Neck Exercises: Theraband   Shoulder Extension  20 reps;Red    Rows  20 reps;Red      Neck Exercises: Seated   Neck Retraction Limitations  15 reps    Cervical Rotation Limitations  15 reps each side SNAG with towel    Lateral Flexion Limitations  30 sec X 2 each side for UT stretch    Other Seated Exercise  neck extension SNAG X 15 reps with towel    Other Seated Exercise  pulleys 2 min flexion, 2 min scaption      Modalities   Modalities  Moist Heat      Moist  Heat Therapy   Number Minutes Moist Heat  7 Minutes   with HEP review   Moist Heat Location  Cervical               PT Short Term Goals - 02/01/19 6270      PT SHORT TERM GOAL #1   Title  Pt will be independent with her HEP.    Baseline  Pt did not previously have an HEP    Time  3    Period  Weeks    Status  New    Target Date  03/01/19      PT SHORT TERM GOAL #2   Title  Pt will report pain 3/10 or less within 3 weeks in her cervical spine to improve quality of life.    Baseline  5/10 pain in the cervical spine    Time  3    Period  Weeks    Status  New    Target Date  03/01/19        PT Long Term Goals - 02/01/19 0940      PT LONG TERM GOAL #1   Title  Pt will increase cervical ROM to 15 degrees flexion, 30 degrees R/L side bending and 30 degrees L rotation within 3 weeks    Baseline  Flexion: 12 degrees, R side bending: 12 degrees, L side bending 11 degrees, L rotation 18 degrees.    Time  3    Period  Weeks    Status  New    Target Date  03/01/19      PT LONG TERM GOAL #2   Title  Pt  will increase B arm strength to 4/5 ER, and L shoulder flexion to demonstrate an improvement in functional strength.    Baseline  3+/5 B  shoulder ER, 4-/5 L shoulder flexion    Time  3    Period  Weeks    Status  New    Target Date  03/01/19            Plan - 02/09/19 0841    Clinical Impression Statement  Session focused on neck ROM and progression of strength program with good tolerance and she had overall less pain from eval. She showed good understanding of her HEP which was reviewed with her and she had no questions. Continue POC    Personal Factors and Comorbidities  Comorbidity 3+    Comorbidities  HTN, COPD, DM    Stability/Clinical Decision Making  Stable/Uncomplicated    Rehab Potential  Good    PT Frequency  1x / week    PT Duration  3 weeks   due to insurance limitations   PT Treatment/Interventions  Electrical Stimulation;Cryotherapy;Moist Heat;Traction;Ultrasound;Therapeutic activities;Therapeutic exercise;Neuromuscular re-education;Patient/family education;Manual techniques;Passive range of motion;Spinal Manipulations;Joint Manipulations    PT Next Visit Plan  Begin scapular stabilization exercises, assess stretching, soft tissue moblization at the cervical spine and MHP    PT Home Exercise Plan  Access Code: [email or web address]    Consulted and Agree with Plan of Care  Patient       Patient will benefit from skilled therapeutic intervention in order to improve the following deficits and impairments:  Increased muscle spasms, Decreased range of motion, Decreased strength  Visit Diagnosis: Cervicalgia  Abnormal posture  Acute pain of left shoulder  Acute pain of right shoulder     Problem List Patient Active Problem List   Diagnosis Date Noted  . Cocaine use disorder, moderate, dependence ([place]) 09/11/2014  .  Schizoaffective disorder, bipolar type ([place]) 09/10/2014  . HBP (high blood pressure) 03/29/2013  . Insulin dependent type 2 diabetes mellitus, uncontrolled  ([place]) 01/06/2013  . PTSD (post-traumatic stress disorder) 06/24/2011    Class: Chronic    [name] 02/09/2019, 8:43 AM  [facility] [address] [place], [place], [address] Phone: [phone]   Fax:  [phone]  Name: Kathleen [name] MRN: [id number] Date of Birth: Jun 26, 1965

## 2019-02-16 ENCOUNTER — Ambulatory Visit: Payer: Medicaid Other | Admitting: Physical Therapy

## 2019-02-23 ENCOUNTER — Telehealth: Payer: Self-pay | Admitting: Physical Therapy

## 2019-02-23 ENCOUNTER — Ambulatory Visit: Payer: Medicaid Other | Admitting: Physical Therapy

## 2019-02-23 NOTE — Telephone Encounter (Signed)
Left voicemail regarding missed appointment today. Fredrika Canby C. Colene Mines PT, DPT 02/23/19 4:29 PM

## 2022-01-09 DIAGNOSIS — R609 Edema, unspecified: Secondary | ICD-10-CM | POA: Diagnosis not present

## 2022-01-09 DIAGNOSIS — E669 Obesity, unspecified: Secondary | ICD-10-CM | POA: Diagnosis not present

## 2022-01-09 DIAGNOSIS — M62838 Other muscle spasm: Secondary | ICD-10-CM | POA: Diagnosis not present

## 2022-01-09 DIAGNOSIS — E119 Type 2 diabetes mellitus without complications: Secondary | ICD-10-CM | POA: Diagnosis not present

## 2022-01-09 DIAGNOSIS — J45909 Unspecified asthma, uncomplicated: Secondary | ICD-10-CM | POA: Diagnosis not present

## 2022-01-09 DIAGNOSIS — Z7985 Long-term (current) use of injectable non-insulin antidiabetic drugs: Secondary | ICD-10-CM | POA: Diagnosis not present

## 2022-01-09 DIAGNOSIS — Z6832 Body mass index (BMI) 32.0-32.9, adult: Secondary | ICD-10-CM | POA: Diagnosis not present

## 2022-01-09 DIAGNOSIS — Z833 Family history of diabetes mellitus: Secondary | ICD-10-CM | POA: Diagnosis not present

## 2022-01-09 DIAGNOSIS — I10 Essential (primary) hypertension: Secondary | ICD-10-CM | POA: Diagnosis not present

## 2022-01-09 DIAGNOSIS — M1990 Unspecified osteoarthritis, unspecified site: Secondary | ICD-10-CM | POA: Diagnosis not present

## 2022-01-09 DIAGNOSIS — Z720 Tobacco use: Secondary | ICD-10-CM | POA: Diagnosis not present

## 2022-01-09 DIAGNOSIS — Z803 Family history of malignant neoplasm of breast: Secondary | ICD-10-CM | POA: Diagnosis not present

## 2022-08-04 DIAGNOSIS — Z803 Family history of malignant neoplasm of breast: Secondary | ICD-10-CM | POA: Diagnosis not present

## 2022-08-04 DIAGNOSIS — E785 Hyperlipidemia, unspecified: Secondary | ICD-10-CM | POA: Diagnosis not present

## 2022-08-04 DIAGNOSIS — I10 Essential (primary) hypertension: Secondary | ICD-10-CM | POA: Diagnosis not present

## 2022-08-04 DIAGNOSIS — F419 Anxiety disorder, unspecified: Secondary | ICD-10-CM | POA: Diagnosis not present

## 2022-08-04 DIAGNOSIS — J45909 Unspecified asthma, uncomplicated: Secondary | ICD-10-CM | POA: Diagnosis not present

## 2022-08-04 DIAGNOSIS — E1140 Type 2 diabetes mellitus with diabetic neuropathy, unspecified: Secondary | ICD-10-CM | POA: Diagnosis not present

## 2022-08-04 DIAGNOSIS — Z833 Family history of diabetes mellitus: Secondary | ICD-10-CM | POA: Diagnosis not present

## 2022-08-04 DIAGNOSIS — Z811 Family history of alcohol abuse and dependence: Secondary | ICD-10-CM | POA: Diagnosis not present

## 2022-08-04 DIAGNOSIS — Z794 Long term (current) use of insulin: Secondary | ICD-10-CM | POA: Diagnosis not present

## 2022-08-04 DIAGNOSIS — F319 Bipolar disorder, unspecified: Secondary | ICD-10-CM | POA: Diagnosis not present

## 2022-08-04 DIAGNOSIS — R32 Unspecified urinary incontinence: Secondary | ICD-10-CM | POA: Diagnosis not present

## 2022-08-04 DIAGNOSIS — Z8249 Family history of ischemic heart disease and other diseases of the circulatory system: Secondary | ICD-10-CM | POA: Diagnosis not present
